# Patient Record
Sex: Female | Born: 1960 | Race: Black or African American | Hispanic: No | Smoking: Former smoker
Health system: Southern US, Community
[De-identification: ages and names within clinical notes are randomized; demographics above are authoritative.]

## PROBLEM LIST (undated history)

## (undated) DIAGNOSIS — E7800 Pure hypercholesterolemia, unspecified: Secondary | ICD-10-CM

## (undated) DIAGNOSIS — E669 Obesity, unspecified: Secondary | ICD-10-CM

## (undated) DIAGNOSIS — L03116 Cellulitis of left lower limb: Secondary | ICD-10-CM

## (undated) DIAGNOSIS — M25561 Pain in right knee: Secondary | ICD-10-CM

## (undated) DIAGNOSIS — K5900 Constipation, unspecified: Secondary | ICD-10-CM

## (undated) DIAGNOSIS — S069X1A Unspecified intracranial injury with loss of consciousness of 30 minutes or less, initial encounter: Secondary | ICD-10-CM

## (undated) DIAGNOSIS — S069X9A Unspecified intracranial injury with loss of consciousness of unspecified duration, initial encounter: Secondary | ICD-10-CM

## (undated) DIAGNOSIS — M1990 Unspecified osteoarthritis, unspecified site: Secondary | ICD-10-CM

## (undated) DIAGNOSIS — R0602 Shortness of breath: Secondary | ICD-10-CM

## (undated) DIAGNOSIS — I10 Essential (primary) hypertension: Secondary | ICD-10-CM

## (undated) DIAGNOSIS — M14679 Charcot's joint, unspecified ankle and foot: Secondary | ICD-10-CM

## (undated) HISTORY — PX: JOINT REPLACEMENT: SHX530

## (undated) HISTORY — PX: COLONOSCOPY: SHX174

## (undated) HISTORY — PX: OTHER SURGICAL HISTORY: SHX169

## (undated) HISTORY — PX: QUADRICEPS REPAIR: SHX2281

## (undated) HISTORY — PX: KNEE SURGERY: SHX244

---

## 2005-12-18 ENCOUNTER — Emergency Department (HOSPITAL_COMMUNITY): Admission: EM | Admit: 2005-12-18 | Discharge: 2005-12-18 | Payer: Self-pay | Admitting: Emergency Medicine

## 2006-06-08 ENCOUNTER — Ambulatory Visit: Payer: Self-pay | Admitting: Endocrinology

## 2006-06-16 ENCOUNTER — Ambulatory Visit: Payer: Self-pay | Admitting: Endocrinology

## 2006-07-19 ENCOUNTER — Ambulatory Visit: Payer: Self-pay | Admitting: Endocrinology

## 2007-07-06 ENCOUNTER — Inpatient Hospital Stay (HOSPITAL_COMMUNITY): Admission: EM | Admit: 2007-07-06 | Discharge: 2007-07-11 | Payer: Self-pay | Admitting: *Deleted

## 2007-07-08 ENCOUNTER — Ambulatory Visit: Payer: Self-pay | Admitting: Internal Medicine

## 2007-07-17 ENCOUNTER — Encounter: Admission: RE | Admit: 2007-07-17 | Discharge: 2007-08-28 | Payer: Self-pay | Admitting: Internal Medicine

## 2007-07-18 ENCOUNTER — Ambulatory Visit: Payer: Self-pay | Admitting: Endocrinology

## 2007-07-29 ENCOUNTER — Encounter: Payer: Self-pay | Admitting: Endocrinology

## 2007-07-29 DIAGNOSIS — I10 Essential (primary) hypertension: Secondary | ICD-10-CM | POA: Insufficient documentation

## 2007-07-29 DIAGNOSIS — F329 Major depressive disorder, single episode, unspecified: Secondary | ICD-10-CM | POA: Insufficient documentation

## 2007-07-29 DIAGNOSIS — E119 Type 2 diabetes mellitus without complications: Secondary | ICD-10-CM

## 2007-08-01 ENCOUNTER — Ambulatory Visit: Payer: Self-pay | Admitting: Endocrinology

## 2007-08-06 ENCOUNTER — Emergency Department (HOSPITAL_COMMUNITY): Admission: EM | Admit: 2007-08-06 | Discharge: 2007-08-06 | Payer: Self-pay | Admitting: Emergency Medicine

## 2007-08-30 ENCOUNTER — Ambulatory Visit: Payer: Self-pay | Admitting: Endocrinology

## 2007-10-17 ENCOUNTER — Telehealth: Payer: Self-pay | Admitting: Endocrinology

## 2007-11-01 ENCOUNTER — Ambulatory Visit: Payer: Self-pay | Admitting: Endocrinology

## 2007-11-01 DIAGNOSIS — R079 Chest pain, unspecified: Secondary | ICD-10-CM | POA: Insufficient documentation

## 2007-11-08 ENCOUNTER — Encounter: Payer: Self-pay | Admitting: Endocrinology

## 2007-11-08 ENCOUNTER — Ambulatory Visit: Payer: Self-pay

## 2007-11-13 ENCOUNTER — Emergency Department (HOSPITAL_COMMUNITY): Admission: EM | Admit: 2007-11-13 | Discharge: 2007-11-13 | Payer: Self-pay | Admitting: Emergency Medicine

## 2007-11-15 ENCOUNTER — Encounter (INDEPENDENT_AMBULATORY_CARE_PROVIDER_SITE_OTHER): Payer: Self-pay | Admitting: *Deleted

## 2007-11-16 ENCOUNTER — Emergency Department (HOSPITAL_COMMUNITY): Admission: EM | Admit: 2007-11-16 | Discharge: 2007-11-16 | Payer: Self-pay | Admitting: Emergency Medicine

## 2007-11-17 ENCOUNTER — Ambulatory Visit: Payer: Self-pay | Admitting: Internal Medicine

## 2007-11-17 DIAGNOSIS — E1169 Type 2 diabetes mellitus with other specified complication: Secondary | ICD-10-CM

## 2007-11-17 LAB — CONVERTED CEMR LAB
Creatinine, Ser: 0.9 mg/dL (ref 0.4–1.2)
GFR calc Af Amer: 87 mL/min
HDL: 29 mg/dL — ABNORMAL LOW (ref >39.0)
Hgb A1c MFr Bld: 8 % — ABNORMAL HIGH (ref 4.6–6.0)
Potassium: 4.3 meq/L (ref 3.5–5.1)
Sodium: 137 meq/L (ref 135–145)
Total CHOL/HDL Ratio: 3.9
Triglycerides: 57 mg/dL (ref 0–149)

## 2007-11-19 DIAGNOSIS — F411 Generalized anxiety disorder: Secondary | ICD-10-CM | POA: Insufficient documentation

## 2007-11-27 ENCOUNTER — Encounter (HOSPITAL_BASED_OUTPATIENT_CLINIC_OR_DEPARTMENT_OTHER): Admission: RE | Admit: 2007-11-27 | Discharge: 2007-12-17 | Payer: Self-pay | Admitting: Internal Medicine

## 2007-11-28 ENCOUNTER — Ambulatory Visit (HOSPITAL_COMMUNITY): Admission: RE | Admit: 2007-11-28 | Discharge: 2007-11-28 | Payer: Self-pay | Admitting: Surgery

## 2007-12-13 ENCOUNTER — Ambulatory Visit (HOSPITAL_COMMUNITY): Admission: RE | Admit: 2007-12-13 | Discharge: 2007-12-13 | Payer: Self-pay | Admitting: Surgery

## 2007-12-15 ENCOUNTER — Encounter: Payer: Self-pay | Admitting: Internal Medicine

## 2007-12-18 ENCOUNTER — Encounter (HOSPITAL_BASED_OUTPATIENT_CLINIC_OR_DEPARTMENT_OTHER): Admission: RE | Admit: 2007-12-18 | Discharge: 2008-01-05 | Payer: Self-pay | Admitting: Surgery

## 2008-01-04 ENCOUNTER — Encounter: Payer: Self-pay | Admitting: Internal Medicine

## 2008-01-08 ENCOUNTER — Encounter: Payer: Self-pay | Admitting: Internal Medicine

## 2008-01-08 ENCOUNTER — Ambulatory Visit (HOSPITAL_COMMUNITY): Admission: RE | Admit: 2008-01-08 | Discharge: 2008-01-08 | Payer: Self-pay | Admitting: Surgery

## 2008-01-08 ENCOUNTER — Encounter (HOSPITAL_BASED_OUTPATIENT_CLINIC_OR_DEPARTMENT_OTHER): Admission: RE | Admit: 2008-01-08 | Discharge: 2008-02-22 | Payer: Self-pay | Admitting: Surgery

## 2008-01-10 ENCOUNTER — Ambulatory Visit: Payer: Self-pay | Admitting: Internal Medicine

## 2008-01-10 ENCOUNTER — Telehealth (INDEPENDENT_AMBULATORY_CARE_PROVIDER_SITE_OTHER): Payer: Self-pay | Admitting: *Deleted

## 2008-01-10 DIAGNOSIS — M79609 Pain in unspecified limb: Secondary | ICD-10-CM | POA: Insufficient documentation

## 2008-01-16 ENCOUNTER — Ambulatory Visit: Payer: Self-pay

## 2008-01-16 ENCOUNTER — Encounter: Payer: Self-pay | Admitting: Internal Medicine

## 2008-02-16 ENCOUNTER — Encounter: Payer: Self-pay | Admitting: Endocrinology

## 2008-05-09 ENCOUNTER — Emergency Department (HOSPITAL_COMMUNITY): Admission: EM | Admit: 2008-05-09 | Discharge: 2008-05-09 | Payer: Self-pay | Admitting: Emergency Medicine

## 2008-05-15 ENCOUNTER — Emergency Department (HOSPITAL_COMMUNITY): Admission: EM | Admit: 2008-05-15 | Discharge: 2008-05-15 | Payer: Self-pay | Admitting: Emergency Medicine

## 2008-08-22 ENCOUNTER — Emergency Department (HOSPITAL_COMMUNITY): Admission: EM | Admit: 2008-08-22 | Discharge: 2008-08-23 | Payer: Self-pay | Admitting: Emergency Medicine

## 2009-02-09 ENCOUNTER — Emergency Department (HOSPITAL_COMMUNITY): Admission: EM | Admit: 2009-02-09 | Discharge: 2009-02-09 | Payer: Self-pay | Admitting: Emergency Medicine

## 2009-12-01 ENCOUNTER — Ambulatory Visit: Payer: Self-pay | Admitting: Diagnostic Radiology

## 2009-12-01 ENCOUNTER — Ambulatory Visit (HOSPITAL_BASED_OUTPATIENT_CLINIC_OR_DEPARTMENT_OTHER): Admission: RE | Admit: 2009-12-01 | Discharge: 2009-12-01 | Payer: Self-pay | Admitting: Internal Medicine

## 2010-02-03 ENCOUNTER — Emergency Department (HOSPITAL_COMMUNITY): Admission: EM | Admit: 2010-02-03 | Discharge: 2010-02-03 | Payer: Self-pay | Admitting: Family Medicine

## 2010-07-23 ENCOUNTER — Encounter: Admission: RE | Admit: 2010-07-23 | Discharge: 2010-09-11 | Payer: Self-pay | Admitting: Internal Medicine

## 2011-01-07 ENCOUNTER — Other Ambulatory Visit: Payer: Self-pay | Admitting: Internal Medicine

## 2011-01-07 DIAGNOSIS — Z1231 Encounter for screening mammogram for malignant neoplasm of breast: Secondary | ICD-10-CM

## 2011-01-07 DIAGNOSIS — Z1239 Encounter for other screening for malignant neoplasm of breast: Secondary | ICD-10-CM

## 2011-01-22 ENCOUNTER — Ambulatory Visit
Admission: RE | Admit: 2011-01-22 | Discharge: 2011-01-22 | Disposition: A | Discharge status: Home / Self Care | Payer: Medicaid Other | Source: Ambulatory Visit | Attending: Internal Medicine | Admitting: Internal Medicine

## 2011-01-22 DIAGNOSIS — Z1231 Encounter for screening mammogram for malignant neoplasm of breast: Secondary | ICD-10-CM

## 2011-03-17 ENCOUNTER — Ambulatory Visit: Payer: Medicaid Other | Attending: Internal Medicine | Admitting: Rehabilitation

## 2011-03-17 DIAGNOSIS — M79609 Pain in unspecified limb: Secondary | ICD-10-CM | POA: Insufficient documentation

## 2011-03-17 DIAGNOSIS — IMO0001 Reserved for inherently not codable concepts without codable children: Secondary | ICD-10-CM | POA: Insufficient documentation

## 2011-03-17 DIAGNOSIS — M25676 Stiffness of unspecified foot, not elsewhere classified: Secondary | ICD-10-CM | POA: Insufficient documentation

## 2011-03-17 DIAGNOSIS — M25673 Stiffness of unspecified ankle, not elsewhere classified: Secondary | ICD-10-CM | POA: Insufficient documentation

## 2011-03-30 LAB — CBC
HCT: 35 % — ABNORMAL LOW (ref 36.0–46.0)
Platelets: 254 10*3/uL (ref 150–400)
RBC: 4.37 MIL/uL (ref 3.87–5.11)
RDW: 17.2 % — ABNORMAL HIGH (ref 11.5–15.5)
WBC: 6.6 10*3/uL (ref 4.0–10.5)

## 2011-03-30 LAB — URINALYSIS, ROUTINE W REFLEX MICROSCOPIC
Bilirubin Urine: NEGATIVE
Ketones, ur: NEGATIVE mg/dL
Leukocytes, UA: NEGATIVE
Nitrite: NEGATIVE
Urobilinogen, UA: 0.2 mg/dL (ref 0.0–1.0)

## 2011-03-30 LAB — DIFFERENTIAL
Eosinophils Absolute: 0 10*3/uL (ref 0.0–0.7)
Lymphocytes Relative: 7 % — ABNORMAL LOW (ref 12–46)
Monocytes Absolute: 0.4 10*3/uL (ref 0.1–1.0)
Neutro Abs: 5.8 10*3/uL (ref 1.7–7.7)
Neutrophils Relative %: 87 % — ABNORMAL HIGH (ref 43–77)

## 2011-03-30 LAB — COMPREHENSIVE METABOLIC PANEL
ALT: 24 U/L (ref 0–35)
AST: 24 U/L (ref 0–37)
Albumin: 2.9 g/dL — ABNORMAL LOW (ref 3.5–5.2)
Alkaline Phosphatase: 44 U/L (ref 39–117)
Creatinine, Ser: 0.79 mg/dL (ref 0.4–1.2)
GFR calc Af Amer: >60 mL/min (ref >60)
Potassium: 3.7 mEq/L (ref 3.5–5.1)
Total Protein: 6.2 g/dL (ref 6.0–8.3)

## 2011-03-30 LAB — URINE MICROSCOPIC-ADD ON

## 2011-03-30 LAB — GLUCOSE, CAPILLARY: Glucose-Capillary: 203 mg/dL — ABNORMAL HIGH (ref 70–99)

## 2011-03-31 ENCOUNTER — Ambulatory Visit: Payer: Medicaid Other | Admitting: Rehabilitation

## 2011-04-07 ENCOUNTER — Encounter: Payer: Medicaid Other | Admitting: Rehabilitation

## 2011-04-07 ENCOUNTER — Ambulatory Visit: Payer: Medicaid Other | Admitting: Rehabilitative and Restorative Service Providers"

## 2011-04-20 ENCOUNTER — Ambulatory Visit: Payer: Medicaid Other | Attending: Internal Medicine | Admitting: Rehabilitation

## 2011-04-20 DIAGNOSIS — M25673 Stiffness of unspecified ankle, not elsewhere classified: Secondary | ICD-10-CM | POA: Insufficient documentation

## 2011-04-20 DIAGNOSIS — M25676 Stiffness of unspecified foot, not elsewhere classified: Secondary | ICD-10-CM | POA: Insufficient documentation

## 2011-04-20 DIAGNOSIS — M79609 Pain in unspecified limb: Secondary | ICD-10-CM | POA: Insufficient documentation

## 2011-04-20 DIAGNOSIS — IMO0001 Reserved for inherently not codable concepts without codable children: Secondary | ICD-10-CM | POA: Insufficient documentation

## 2011-04-27 NOTE — Assessment & Plan Note (Signed)
[facility]   NAME:  DANAIJA, ESKRIDGE           ACCOUNT NO.:  [id number]   MEDICAL RECORD NO.:  [id number]      DATE OF BIRTH:  January 16, 1961   PHYSICIAN:  Theresia Majors. Tanda Rockers, M.D. VISIT DATE:  02/01/2008                                   OFFICE VISIT   SUBJECTIVE:  Ms. Minus returns for follow-up of a [address] 4 diabetic  foot ulcer involving the second toe of the left foot.  The patient has  completed a course of hyperbaric oxygen treatment with resolution of the  wound.  She returns for follow-up.  There has been no interim drainage  malodor pain or fever.  She continues to be ambulatory with a protective  offloading healing sandal.   OBJECTIVE:  Blood pressure is 147/87, respirations 18, pulse rate 74,  temperature 98.4, capillary blood glucoses 98 mg percent.  Inspection of the left second toe shows that there is absolutely no  drainage and no ulceration.  The toe is bulbous.  The pedal pulse  remains palpable.  There is good capillary refill.  There is associated  2+ edema with chronic changes of stasis bilaterally.   IMPRESSION:  Resolved Wagner grade 3 diabetic foot ulcer.   PLAN:  We are discharging the patient from active management in the  Wound Center.  We have recommended that she procure and wear bilateral  below-the-knee open toe 30-40 mmHg compression hose.  We have also given  her a prescription for an orthotics consultation for the fashioning of  custom inserts with extra-depth shoes.  We have given the patient  opportunity to ask questions.  She seems to understand these  instructions.  We have encouraged her to keep her follow-up appointments  with her internist Dr. Oliver Barre.  She indicates that she understands  these instructions and will be compliant expresses gratitude for having  been seen in the clinic.  The patient is discharged.      Harold A. Tanda Rockers, M.D.  Electronically Signed     HAN/MEDQ  D:  02/01/2008  T:  02/01/2008   Job:  [id number]   cc:   Corwin Levins, MD

## 2011-04-27 NOTE — Assessment & Plan Note (Signed)
[facility]   NAME:  Kaitlyn Castillo, Kaitlyn Castillo           ACCOUNT NO.:  [id number]   MEDICAL RECORD NO.:  [id number]      DATE OF BIRTH:  [birth year]   PHYSICIAN:  Theresia Majors. Tanda Rockers, M.D. VISIT DATE:  12/13/2007                                   OFFICE VISIT   SUBJECTIVE:  Ms. Kutzer is a 50 year old female who is undergoing  hyperbaric oxygen treatment for a Wagner grade III diabetic foot ulcer  involving the left foot.  In the interim she has complained of pressure  symptoms in her right ear.  Her external auditory canal is partially  occluded by cerumen.  She has been referred to ENT for evaluation.  She  continues to be ambulatory, utilizing an offloading healing sandal.  There has been no fever.  There has been no drainage.  The patient is  not on antibiotics.   OBJECTIVE:  VITAL SIGNS:  Blood pressure is 128/67, respirations are 18,  pulse rate 72, temperature is 97.8.  Capillary blood glucose is 99.  HEENT:  Exam is clear.  There is some hyperemia of the nasal mucosa.  The right external auditory canal is partially occluded with ear wax.  The left TM is clear.  RESPIRATORY:  The lungs are clear.  CARDIOVASCULAR:  The heart sounds are distant.  MUSCULOSKELETAL:  Examination of the lower extremity shows persistence  of 2+ edema in the left lower extremity.  The second toe has a thick  callus with near sealing of a deeper cavity.   A debridement utilizing a curette was performed with excision of  nonviable necrotic tissue from the subcutaneous level as well as  nonviable necrotic skin.  There was chronic reaction and periosteum from  the distal phalanx.  An aliquot of this debrided material was sent for  pathologic examination, and a culture was taken from the deep wound.  The pedal pulse remains readily palpable.   ASSESSMENT:  A Wagner grade III diabetic foot ulcer.   RECOMMENDATION:  Proceed and complete the ENT evaluation.  Continue  hyperbaric oxygen  treatment.      Harold A. Tanda Rockers, M.D.  Electronically Signed     HAN/MEDQ  D:  12/13/2007  T:  12/13/2007  Job:  [id number]

## 2011-04-27 NOTE — Assessment & Plan Note (Signed)
[facility]   NAME:  Kaitlyn Castillo, Kaitlyn Castillo           ACCOUNT NO.:  [id number]   MEDICAL RECORD NO.:  [id number]      DATE OF BIRTH:  11-Jul-1961   PHYSICIAN:  Theresia Majors. Tanda Rockers, M.D. VISIT DATE:  01/04/2008                                   OFFICE VISIT   SUBJECTIVE:  Ms. Bun is a 50 year old lady who we are following for  a Wagoner III diabetic foot ulcer involving the left second toe.  The  patient is currently undergoing hyperbaric oxygen treatment and has  completed 15 of 30 dives.  There has been no excessive drainage, malodor  or fever.  She continues to have daily irrigations and a ribbon packing  utilizing a quarter-inch plain Nu-Gauze.  There have been no pressure  symptoms in the ears, no pulmonary symptomatology, no significant  claustrophobia or oxygen toxicity symptomatology.   OBJECTIVE:  Her blood pressure is 164/96, pulse rate 64, temperature  98.9.  Capillary blood glucose of 236 mg% following HBO.  HEENT:  Clear.  The tympanic membranes are normal.  LUNGS:  Clear.  Inspection of the wound shows that there is local edema involving the  entire second toe.  A Q-tip was used to sound the depth of the wound.  This is clean with a serous drainage.  There is no extension of  tenderness or pain into the metatarsal head nor into the dorsum of the  foot.  Both feet are symmetrically warm but they are not feverish.  Capillary refill is brisk.  There is no evidence of critical ischemia.  The patient remains mildly insensate.  The pedal pulse is faintly  palpable bilaterally.  The wound was cultured and forwarded.   ASSESSMENT:  Clinical improvement of Wagoner III diabetic foot ulcer.   PLAN:  We will continue the hyperbaric oxygen.  We have started the  patient on Septra DS one p.o. b.i.d.  We will continue her HBO and  reevaluate her after an additional five dives.   We have explained the potential for more proximal extension of her  suppurative  process.  We are  still not sure that our HBO treatment will  result in salvage of the digit.  We have given the patient the  opportunity to ask questions.  She seems to understand and wishes to  continue with the treatment as outlined.      Harold A. Tanda Rockers, M.D.  Electronically Signed     HAN/MEDQ  D:  01/04/2008  T:  01/04/2008  Job:  [id number]

## 2011-04-27 NOTE — Consult Note (Signed)
NAME:  Kaitlyn Castillo, GRANATO           ACCOUNT NO.:  [id number]   MEDICAL RECORD NO.:  [id number]          PATIENT TYPE:  REC   LOCATION:  FOOT                         FACILITY:  [facility]   PHYSICIAN:  Harold A. Tanda Rockers, M.D.DATE OF BIRTH:  09-Jan-1961   DATE OF CONSULTATION:  11/28/2007  DATE OF DISCHARGE:                                 CONSULTATION   SUBJECTIVE:  Ms. Kaitlyn Castillo is a 50 year old female referred by Dr. Oliver Barre for evaluation of an ulceration of the third toe of the left foot.   IMPRESSION:  Wagoner grade 3 diabetic foot ulcer.   RECOMMENDATIONS:  Proceed with hyperbaric oxygen treatment with  continuation of offloading and serial debridements.  We will resume  antibiotic coverage based on the return of her culture results.   SUBJECTIVE:  Ms. Kaitlyn Castillo is a 50 year old diabetic who noted blisters on  her lower extremities in June 2008.  She subsequently developed a frank  ulceration on the distal phalanx of the second toe of the left foot. The  patient was admitted to the hospital, underwent intravenous antibiotic  therapy and bedside debridement by Dr. Lajoyce Corners.  She was subsequently  discharged and continued under the care of Dr. Lajoyce Corners with serial  debridements over the ensuing months.  She has had an independent  evaluations by podiatry.  Her blood glucoses, according to the patient,  have been reasonable.  She was ultimately seen in the emergency room with redness and drainage.  Prior to her evaluation by Dr. Jonny Ruiz, the patient had been advised to  consider an amputation of the third toe.  She apparently chose to ignore  this recommendation and was under no treatment at all until she was seen  in the emergency room and referred to the wound center.   Three weeks ago she was seen in the emergency room with drainage, was  subsequently seen by Dr. Oliver Barre, who was covering for Dr. Romero Belling.  The patient was started on Levaquin and referred to the wound  center for  evaluation.   PAST MEDICAL HISTORY:  Remarkable for diabetes for a decade.   CURRENT MEDICATIONS:  1. A-recently completed course of Levaquin.  2. Lantus 100 units q.a.m.  3. Lisinopril/hydrochlorothiazide 20/12.5 mg.  4. She takes one aspirin 81 mg daily.   She denies allergies.   FAMILY HISTORY:  Positive for diabetes, hypertension, stroke and heart  attack.   Socially, she is separated.  She has four children.  She has moved to  this area from [place].  She is unemployed and cares for an autistic  child.  She is receiving social services assistance.   REVIEW OF SYSTEMS:  Remarkable for recent chest pain, for which she is  undergoing currently a workup.  She has had a negative stress test thus  far and is scheduled for an echocardiogram.  She has had no acute  changes on an EKG.  She is a nonsmoker.  She denies dyspnea on exertion.  She is able to walk four blocks and negotiate a flight of stairs.  Her  bowel and bladder function are described as normal.  She denies visual  changes, transient paralysis or other stigmata of TIAs.  The remainder  of the review of systems is negative.   PHYSICAL EXAM:  She is an alert female in no acute distress.  Blood pressure is 152/88, respirations 16, pulse rate 74, temperature is  98.2, capillary blood glucose is 194 mg%.  HEENT:  Clear.  NECK:  Supple.  Trachea is midline.  Thyroid is nonpalpable.  LUNGS:  Clear.  Heart sounds are distant.  ABDOMEN:  Soft.  EXTREMITIES:  Remarkable for bilateral 3+ edema.  Over the second digit  of the left foot is a penetrating ulcer that extends down to the  cancellous bone of the distal phalanx.  There is a halo of nonviable,  thickened, necrotic callus.  An excisional debridement was performed  with a 10 blade with hemorrhage controlled with direct pressure.  A  culture was taken from the depths of the wound.  NEUROLOGIC:  The patient is insensate.   DISCUSSION:  This patient has a Wagoner grade  3 diabetic foot ulcer with  that has been under treatment for at least 6 months and has had  deterioration.  She has been given adequate offloading but on today's  exam she is wearing a flat-bottom canvas shoe which is totally  inadequate.  The patient has been counseled regarding the critical  nature of this ulcer extending down into the bone.  We have recommended  that we proceed with hyperbaric oxygen treatment as a last resort in an  attempt to avoid an amputation.  We have explained the indications for  hyperbaric oxygen, the potential benefits, potential complications and  the risk specifically of barotrauma, claustrophobia and oxygen toxicity.  We have given the patient an opportunity to ask questions.  She seems to  comprehend the assessment and the recommendation and is anxious to  proceed.  We have initiated a retrieval of her CBC, PA and lateral chest  film and electrocardiogram.  We will schedule her pending review of  these studies.   With regard to her wound, we have encouraged her to continue daily  antiseptic soap washing, thoroughly drying, and we have provided her  with an offloading healing sandal to avoid trauma to and to protect her  insensate extremity.  We will reevaluate her in 1 week.      Harold A. Tanda Rockers, M.D.  Electronically Signed     HAN/MEDQ  D:  11/28/2007  T:  11/28/2007  Job:  [id number]   cc:   Gregary Signs A. Everardo All, MD  Corwin Levins, MD  Nadara Mustard, MD

## 2011-04-27 NOTE — Assessment & Plan Note (Signed)
[facility]   NAME:  Kaitlyn Castillo, Kaitlyn Castillo           ACCOUNT NO.:  [id number]   MEDICAL RECORD NO.:  [id number]      DATE OF BIRTH:  1961/09/12   PHYSICIAN:  Jonelle Sports. Sevier, M.D.  VISIT DATE:  12/05/2007                                   OFFICE VISIT   HISTORY:  This 50 year old black female is seen for her second visit.  She has a Wagner grade 4 ulceration of the left second toe complicated  by osteomyelitis.  She had recommendations for toe amputation but  efforts will be made to salvage that toe here through use of hyperbaric  oxygen therapy.   Since her last visit here a week ago.  She has received approval from  her insurance company and is in the process of completing all of her  medical tests.  Essentially the only thing not yet completed being her  echocardiogram.   She reports the wound itself has been mildly painful but with no  increase in that.  There has been no odor.  She thinks her discharge has  decreased somewhat and she has had no fever or systemic symptoms.  The  toe is quite swollen and remains approximately same that regard.   EXAMINATION:  Blood pressure 120/68 pulse 70 and regular, respirations  18, temperature 98.4.  The wound on the tip of the left second toe plantar aspect measures  0.3x0.5x0.1 cm in depth but with an area extending from the dorsal  margin of this wound toward the nailbed that is soft and slightly  fluctuant.   The toe itself is chronically swollen and there is some tenderness to  palpation particularly over the lateral aspect of that DIP joint.   There is no drainage or odor today of any consequence and the wound  looks reasonably good.   IMPRESSION:  Wagner stage 4 diabetic foot ulcer, left second toe.   DISPOSITION:  1. The wound is debrided partial thickness removing the skin over the      suspicious area dissecting from the wound toward the nailbed.      There is no frank purulence here but some  semi-necrotic tissue      which is removed.  The wound is also a cleaned of considerable      thickening and blistering of the skin around the primary      ulceration.  2. The wound is then treated with application of a dry dressing and      she is placed in a Darco platform walker which she is to continue      until her visit here 6 days hence to begin her hyperbaric oxygen      therapy.  Wound evaluation will be done 3 days into such therapy.           ______________________________  Jonelle Sports. Cheryll Cockayne, M.D.     RES/MEDQ  D:  12/05/2007  T:  12/05/2007  Job:  [id number]

## 2011-04-27 NOTE — Assessment & Plan Note (Signed)
[facility]   NAME:  Kaitlyn Castillo, Kaitlyn Castillo           ACCOUNT NO.:  [id number]   MEDICAL RECORD NO.:  [id number]      DATE OF BIRTH:  23-Jun-1961   PHYSICIAN:  Theresia Majors. Tanda Rockers, M.D. VISIT DATE:  12/13/2007                                   OFFICE VISIT   SUBJECTIVE:  Kaitlyn Castillo is a 50 year old diabetic whom we are following  for Wagner grade 3 diabetic foot ulcer.  We have placed her hyperbaric  oxygen treatment on hold because of complaints of right ear pressure.  She has a pending evaluation from the otolaryngologist.  In the interim  we have proceeded with her wound evaluation.  There has been no  significant drainage or fever.  She continues to be ambulatory and is  wearing an offloading healing sandal.   OBJECTIVE:  Blood pressure is 128/67, respirations are 18, pulse rate  72, temperature 97.8.  The capillary blood glucoses 99 mg percent.  Inspection of the left foot and second toe shows a scared-over calloused  ulcer with an underlying fluctuance.  Upon sounding with a Q-tip this  wound extends down to the distal phalanx.  A curette was used to debride  this wound of nonviable necrotic tissue, periosteum of the distal  phalanx, and subcutaneous tissue.  An aliquot of this was forwarded to  pathology.      Harold A. Tanda Rockers, M.D.     Kaitlyn Castillo  D:  12/13/2007  T:  12/13/2007  Job:  [id number]

## 2011-04-27 NOTE — Assessment & Plan Note (Signed)
[facility]   NAME:  Kaitlyn Castillo, Kaitlyn Castillo           ACCOUNT NO.:  [id number]   MEDICAL RECORD NO.:  [id number]      DATE OF BIRTH:  Mar 08, 1961   PHYSICIAN:  Theresia Majors. Tanda Rockers, M.D. VISIT DATE:  01/11/2008                                   OFFICE VISIT   SUBJECTIVE:  Ms. Mcgibbon is a 50 year old undergoing hyperbaric oxygen  treatment for Wagner 3 diabetic foot ulcer.  In the interim she has had  some persistent drainage which has not been associated with fever.  She  has also had an episode of swelling in her left lower extremity which  has responded to compression and has been attributed to stasis rather  than an acute infection.  Her wound was cultured but the results are  outstanding at this time.  There is been no interim fever.   OBJECTIVE:  Blood pressure is 164/90, respirations 18, pulse rate 71,  temperature is 98.3.  Capillary blood glucoses 161 mg percent following  her HBO treatment.  Inspection of the second toe shows that there has been significant  decrease in the edema.  There is no local warmth.  There is no malodor.  There is no particular drainage.  The Q-tip was used to sound the wound.  The depth is approximately 1.4 cm.  There is no malodor.  There is no  evidence of ascending infection.  The foot is warm but is not feverish.  The pedal pulse remains palpable.   Review of the x-ray shows that there is complete erosion of the distal  portion of the distal phalanx.  There are additional findings of  osteopenia in the foot bones.   ASSESSMENT:  Clinical improvement Wagner grade 3 diabetic foot ulcer  with hyperbaric oxygen.  />  We will continue the patient on Septra pending the final culture  results.  Will continue her HBO.  We have explained that the patient may  ultimately require an amputation of the distal phalanx in order to  remove all nonviable and chronically inflamed tissue.  She is not  enthusiastic about this prospect.  We have  agreed to continue her  hyperbaric as well as her antibiotics.  She will continue follow-up with  Dr. Melvyn Novas for management of her diabetes.  A duplex scan has been  ordered by Dr. Melvyn Novas.  We will reevaluate the wound in 5 days.  We will  continue hyperbarics.      Harold A. Tanda Rockers, M.D.  Electronically Signed     HAN/MEDQ  D:  01/11/2008  T:  01/11/2008  Job:  [id number]

## 2011-04-27 NOTE — Assessment & Plan Note (Signed)
[facility]   NAME:  Kaitlyn Castillo, Kaitlyn Castillo           ACCOUNT NO.:  [id number]   MEDICAL RECORD NO.:  [id number]      DATE OF BIRTH:  Jan 20, 1961   PHYSICIAN:  Theresia Majors. Tanda Rockers, M.D. VISIT DATE:  01/25/2008                                   OFFICE VISIT   SUBJECTIVE:  Kaitlyn Castillo is a 50 year old lady with a Wegener III  diabetic foot ulcer who is undergoing hyperbaric oxygen treatment.  She  has completed 29 of 30 ordered dives today.  She returns for follow-up.  There have been no symptoms of oxygen toxicity, barotrauma or  claustrophobia.  She continues to be ambulatory.   OBJECTIVE:  Her blood pressure is 172/94, pulse rate of 77, respirations  18, temperature is 98.3, capillary blood glucoses 211 mg percent  following her HBO treatment.  Inspection of the wound shows that the  edema is reasonably controlled although there is a persistence of one to  two plus edema with chronic stasis changes.  Inspection of the left  distal phalanx shows that the indentation cavity is still present, but  there has been re-epithelialization throughout.  Attempts at sounding  with a Q-tip does not admit penetration.  There is no drainage.  There  is a bulbous deformity of the toe, but there is no fluctuance.  Inspection of the volar aspect of the foot at the metatarsophalangeal  joint is normal.  There is no evidence of abscess or tracking into the  midfoot   ASSESSMENT:  Clinical improvement with near resolution of the Wegener  III diabetic foot ulcer.   PLAN:  We will complete dive #30 and discontinue her hyperbaric oxygen  treatment.  We will continue her in offloading orthotics.  We will  reevaluate her in one week p.r.n.      Harold A. Tanda Rockers, M.D.  Electronically Signed     HAN/MEDQ  D:  01/25/2008  T:  01/26/2008  Job:  [id number]

## 2011-04-27 NOTE — Assessment & Plan Note (Signed)
[facility]   NAME:  ATAYA, MURDY           ACCOUNT NO.:  [id number]   MEDICAL RECORD NO.:  [id number]      DATE OF BIRTH:  1961-07-25   PHYSICIAN:  Theresia Majors. Tanda Rockers, M.D. VISIT DATE:  12/28/2007                                   OFFICE VISIT   SUBJECTIVE:  Ms. Kaitlyn Castillo is a 50 year old lady who is undergoing  hyperbaric oxygen treatment for a Wagner grade-3 ulcer involving her  left second toe.  In the interim, she has continued to wear an  offloading healing sandal.  She is not on antibiotics.  There has been  no malodorous drainage. There are no symptoms of barotrauma, O2 toxicity  or claustrophobia.   OBJECTIVE:  Blood pressure is 153/88, respirations of 18, pulse rate 75,  temperature 98.6.  Capillary blood glucose is 213 mg percent.  Inspection of the left second toe shows that there is healthy-appearing  granulation in the sinus which was effectively debrided during her last  visit.  There is no evidence of active infection.  The foot is warm.  The pedal pulse is palpable.  The culture report shows no growth.  HEENT-  clear.  Neck-supple.  Lungs clear.  Heart-RRR.   ASSESSMENT:  Clinical response to hyperbaric oxygen treatment.   PLAN:  We will continue HBO with daily irrigations with saline and a  Plain Nu-Gauze ribbon pack into the open wound.  We will reevaluate the  wound in 5 days.      Harold A. Tanda Rockers, M.D.  Electronically Signed     HAN/MEDQ  D:  12/28/2007  T:  12/28/2007  Job:  [id number]

## 2011-04-27 NOTE — H&P (Signed)
NAME:  Kaitlyn Castillo, Kaitlyn Castillo           ACCOUNT NO.:  [id number]   MEDICAL RECORD NO.:  [id number]          PATIENT TYPE:  INP   LOCATION:  5733                         FACILITY:  [facility]   PHYSICIAN:  Kela Millin, M.D.DATE OF BIRTH:  1961/12/08   DATE OF ADMISSION:  07/05/2007  DATE OF DISCHARGE:                              HISTORY & PHYSICAL   PRIMARY CARE PHYSICIAN:  [facility] (patient does not remember  her physician's name).   CHIEF COMPLAINT:  Left second toe pain and swelling of foot.   HISTORY OF PRESENT ILLNESS:  The patient is a 50 year old, obese, black  female with past medical history significant for diabetes mellitus,  hypertension, and hyperlipidemia who presents with above complaints.  She states that one week ago she noticed a blister under the plantar  surface of the top of her left second toe and she also noticed some  swelling of that left foot.  Subsequently she reports that the blister  burst and then she began having a lot of pain in that left foot and the  swelling has worsened.  She also states that she has had some redness of  that foot.  She denies fevers, cough, chest pain, shortness of breath,  melena, diarrhea, and also hematochezia.  The patient was seen in the  emergency room and her blood glucose was noted to be elevated at 249.  The patient states that she was scheduled to see a podiatrist in two  days, but because she was having so much pain and the swelling  worsening, she knew she could not make it and so came to the ER.  She is  admitted for pain management and further evaluation.   PAST MEDICAL HISTORY:  As stated above.   MEDICATIONS:  The patient does not know the names of her medications  other than that she is on a diabetes and blood pressure medicine.   ALLERGIES:  No known drug allergies.   SOCIAL HISTORY:  She quit tobacco greater than 20 years ago.  She denies  alcohol.   FAMILY HISTORY:  Her father and mother had  hypertension, diabetes, and  MI.  They are both deceased.   REVIEW OF SYSTEMS:  See HPI.  Other review of systems negative.   PHYSICAL EXAMINATION:  GENERAL:  In general the patient is an obese,  middle-aged black female, in no respiratory distress.  VITAL SIGNS:  Temperature is 97.4, her blood pressure is 161/88,  initially 192/103, pulse is 63 and the O2 saturation is 97%.  HEENT:  PERRL, EOMI.  Sclerae anicteric.  Moist mucous membranes.  LUNGS:  Clear to auscultation bilaterally.  No crackles or wheezes.  CARDIOVASCULAR:  Regular rate and rhythm.  Normal S1/S2.  ABDOMEN:  Soft, bowel sounds present, nontender, nondistended.  No  organomegaly and no masses palpable.  EXTREMITIES:  The dorsum of her left foot is edematous and her second  toe is edematous as well, hyperpigmented, and on the plantar surface,  she has a dime-sized ulcer with a clean base at the top of her second  toe (plantar surface).  Warm, +1 pulses.  The right lower extremity is  within normal limits.   LABORATORY DATA:  The white cell count is 5.8, hemoglobin 12.7,  hematocrit 39.5, platelet count is 318, neutrophil count 56%.  Sodium is  136 with a potassium of 3.9, chloride 103, BUN 15, glucose 249.  The pH  is 7.37, PCO2 46.4, bicarb 27.3.   ASSESSMENT/PLAN:  1. Left diabetic foot ulcer with infection/cellulitis - will obtain x-      ray of left foot, empiric antibiotics, wound care consult.  Will      consider surgery consultation as appropriate pending above.  2. Diabetes mellitus - monitor Accu-Cheks, sliding scale coverage, and      start the patient on Lantus and optimize blood glucose control.  3. Hypertension - uncontrolled, initially, but improved as above,      start lisinopril as the patient does not know her medications, and      follow up.  4. Hyperlipidemia - obtain a fasting lipid profile and follow up.      Kela Millin, M.D.  Electronically Signed     ACV/MEDQ  D:  07/06/2007   T:  07/07/2007  Job:  [id number]   cc:   [facility]

## 2011-04-27 NOTE — Discharge Summary (Signed)
Kaitlyn Castillo, Kaitlyn Castillo           ACCOUNT NO.:  [id number]   MEDICAL RECORD NO.:  [id number]          PATIENT TYPE:  INP   LOCATION:  5733                         FACILITY:  [facility]   PHYSICIAN:  Valerie A. Felicity Coyer, MDDATE OF BIRTH:  05-Dec-1961   DATE OF ADMISSION:  07/05/2007  DATE OF DISCHARGE:  07/11/2007                               DISCHARGE SUMMARY   DISCHARGE DIAGNOSES:  1. Diabetic ulcer, left foot.  2. Diabetes, type 2, uncontrolled.  3. Hypertension.   HISTORY OF PRESENT ILLNESS:  Kaitlyn Castillo is a 50 year old female who was  admitted on July 06, 2007 with chief complaint of left toe pain and  swelling of left foot.  She noted 1 week prior to this admission  blistering of the plantar surface of the top of her left 2nd toe and  also noted some swelling of that foot.  She was admitted for further  evaluation and treatment.   PAST MEDICAL HISTORY:  1. Diabetes type 2.  2. Hypertension.  3. Hyperlipidemia.   COURSE OF HOSPITALIZATION:  PROBLEM #1 - DIABETIC ULCER, LEFT FOOT:  The  patient was admitted and was seen in consultation by Dr. Lajoyce Corners during  this admission, who recommend a Darco shoe and continued wound therapy.  He also recommended followup in the office 1 week after discharge.  He  felt that the patient may need heel cord lengthening if stretching is  unsuccessful.  She was treated with IV Unasyn, which was changed to oral  antibiotics at time of discharge.   PROBLEM #2 - DIABETES TYPE 2, UNCONTROLLED:  The patient was noted to  have a hemoglobin A1c of 14.5 on admission, suggesting noncompliance  prior to this admission.  She was started on Lantus during this  admission and Lantus was titrated upward to 30 units, at which time  blood sugars were in the 170s.  She was discharged to home after insulin  self-administration teaching.   MEDICATIONS AT TIME OF DISCHARGE:  1. Lantus insulin 30-unit injection subcu daily in the evening.  2. Insulin syringes,  alcohol swabs and test strips prescription      provided.  3. Simvastatin 80 mg p.o. daily.  4. Augmentin 875 mg p.o. b.i.d. for 7 days.  5. Lisinopril/hydrochlorothiazide 20/12.5 p.o. b.i.d.   DISCHARGE INSTRUCTIONS:  The patient was instructed to test her blood  sugar at least twice daily and keep a log of blood sugars to bring to  her appointment with Dr. Everardo All.   PERTINENT LABORATORY DATA AT TIME OF DISCHARGE:  Hemoglobin A1c 14.5.  BUN 14, creatinine 0.88.  Sodium 138, potassium 3.9.   FOLLOWUP:  The patient was instructed follow up with Dr. Everardo All on  July 18, 2007 at 3:15 p.m. and to follow up with Dr. Lajoyce Corners in 1 week.      Sandford Craze, NP      Raenette Rover. Felicity Coyer, MD  Electronically Signed    MO/MEDQ  D:  08/16/2007  T:  08/17/2007  Job:  [id number]   cc:   Gregary Signs A. Everardo All, MD

## 2011-04-27 NOTE — Assessment & Plan Note (Signed)
[facility]   NAME:  Kaitlyn Castillo, Kaitlyn Castillo           ACCOUNT NO.:  [id number]   MEDICAL RECORD NO.:  [id number]      DATE OF BIRTH:  02/21/1961   PHYSICIAN:  Theresia Majors. Tanda Rockers, M.D. VISIT DATE:  12/21/2007                                   OFFICE VISIT   SUBJECTIVE:  Kaitlyn Castillo is a 50 year old lady who is currently  undergoing hyperbaric oxygen treatment for Wagner grade 3 diabetic foot  ulcer involving the 2nd toe of the left foot.  In the interim, she  denies drainage.  There has been no ear pain, no symptoms of  claustrophobia or oxygen toxicity.   OBJECTIVE:  VITAL SIGNS:  Blood pressure is 184/100 (following HBO pulse  rate is 75), respirations are 18, temperature is 98.7.  Capillary blood  glucose is 230 mg percent post HBO.  HEENT EXAM:  Clear.  The tympanic membranes are clear.  There is no  evidence of barotrauma.  Trachea is midline.  LUNGS:  Clear.  EXTREMITIES:  Inspection of the wound involving the left 2nd toe shows  that there is a penetrating sinus 1.5 cm extending down to the  periosteum.  A rongeur was used to excise necrotic tissue with chronic-  appearing granulation.  An ellipse was used to create a fish mouth  through which a more effective debridement including skin, subcutaneous  tissue, and bone was excised.  Hemorrhage was controlled with direct  pressure.  The wound was copiously irrigated with saline, and an  Iodoform gauze packing was placed.  Inspection of the patient's footwear  a appears to be inadequate.  We will therefore change her to a flat  healing sandal with a transverse felt strip placed at the metatarsal  heads to offload the distal 2nd toe.   ASSESSMENT:  Clinical response to hyperbaric oxygen treatment.   PLAN:  We will continue the hyperbaric oxygen as well as daily  irrigations and Iodoform gauze packing.  She will complete her Levaquin  as prescribed.  We will re-evaluate the patient in 1 week.      Harold A.  Tanda Rockers, M.D.  Electronically Signed     HAN/MEDQ  D:  12/21/2007  T:  12/21/2007  Job:  [id number]

## 2011-04-27 NOTE — Assessment & Plan Note (Signed)
[facility]   NAME:  Kaitlyn Castillo, Kaitlyn Castillo           ACCOUNT NO.:  [id number]   MEDICAL RECORD NO.:  [id number]      DATE OF BIRTH:  02-03-1961   PHYSICIAN:  Theresia Majors. Tanda Rockers, M.D. VISIT DATE:  01/08/2008                                   OFFICE VISIT   SUBJECTIVE:  Kaitlyn Castillo is a 50 year old diabetic who is undergoing  hyperbaric oxygen treatment for Wagner grade III wound.  Over the  weekend the patient has noted increased swelling in her left lower  extremity.  She has had no fever.  She continues to be ambulatory and  active.  Her sugars have been as high as 250s to 60s.  She has been not  compliant with dietary restrictions and she has been increasing her  caloric intake in preparation for her hyperbarics.  There has been no  pain in the extremity.  There has been no palpitations or syncope.  There has been no polydipsia or polyuria.   OBJECTIVE:  VITAL SIGNS:  Blood pressure is 130/72, respirations 18,  pulse rate 73, temperature is 99.1.  Capillary blood glucose is 278 mg  percent. Inspection of the left foot shows that the left second toe in  fact has decreased in edema and is less painful.  The wound was probed  with a Q-tip and there is no malodor.  There is associated one to two  plus edema with a readily palpable dorsalis pedis pulse on the  anterolateral aspect. Of the left lower extremity is a hyperemic streak  that extends 6 cm inferior to the patella.  This area is locally warm  but is not excessively tender.  There is no tenderness in the popliteal  fossa.  There is no tenderness extending above the knee. There is full  range of motion. The patient remains insensate to the signs Weinstein  filament.   REVIEW OF HER CULTURES:  Show no significant growth.   ASSESSMENT:  Wagner grade 3 diabetic foot ulcer with possible stasis or  early cellulitis with possible lymphangitis.   PLAN:  The wound was recultured.  We will probe and irrigate the  wound.  We placed an iodoform ribbon packing using a 1/4 inch gauze into the  with depth of the wound. We will continue the patient in a healing  sandal. In addition, we will apply a Kerlix and an Ace for compression.  We have instructed the patient to make an appointment with a primary  care physician for consideration and management of her diabetes.  She  will continue on the Septra DS one p.o. b.i.d.  We will reevaluate her  prior to her HBO treatment on tomorrow.  The patient realizes that if  she develops fever or more proximal migration of her redness or malodor,  she is to call the clinic or contact a primary care physician for  further advice.  We have given the patient opportunity to ask questions.  She seems to understand and indicates that she will be compliant      Jake Shark A. Tanda Rockers, M.D.  Electronically Signed     HAN/MEDQ  D:  01/08/2008  T:  01/08/2008  Job:  [id number]   cc:   Corwin Levins, MD

## 2011-04-27 NOTE — Assessment & Plan Note (Signed)
[facility]   NAME:  Kaitlyn Castillo, Kaitlyn Castillo           ACCOUNT NO.:  [id number]   MEDICAL RECORD NO.:  [id number]      DATE OF BIRTH:  Jan 22, 1961   PHYSICIAN:  Theresia Majors. Tanda Rockers, M.D.      VISIT DATE:                                   OFFICE VISIT   SUBJECTIVE:  Kaitlyn Castillo is a 50 year old with a Wagner III diabetic  foot ulcer involving the left second toe.  In the interim, we have  treated her with hyperbarics, daily irrigations, and packing utilizing a  quarter-inch ribbon new gauze.  There has been no excessive drainage or  malodor.  There is been no pain.   OBJECTIVE:  VITAL SIGNS:  Blood pressure is 156/96, respirations 18,  pulse rate 75, temperature is 98.4, capillary blood glucose is 115 mg  percent, following her HBO.  Inspection of the toe shows that there has  been significant decrease in edema.  There is trace to no erythema at  all.  There is no local warmth.  The wound was sounded with a Q-tip and  extends down to the pulp.  There is reactive bleeding.  There is no  malodor.  The pedal pulse is palpable.  The edema in the pre-tibial and  ankle area is 1 to 2+ at best.  There is no local warmth.   ASSESSMENT:  Clinical improvement.   PLAN:  We will continue to HBO.  The patient is now on dive #24.  We  will ask that she be re-approved for an additional 30 dives.  We will  continue the local wound care and re-evaluate her wound in 1 week.      Harold A. Tanda Rockers, M.D.  Electronically Signed     HAN/MEDQ  D:  01/18/2008  T:  01/19/2008  Job:  [id number]

## 2011-05-11 ENCOUNTER — Emergency Department (HOSPITAL_COMMUNITY): Payer: Medicaid Other

## 2011-05-11 ENCOUNTER — Emergency Department (HOSPITAL_COMMUNITY)
Admission: EM | Admit: 2011-05-11 | Discharge: 2011-05-11 | Disposition: A | Discharge status: Home / Self Care | Payer: Medicaid Other | Attending: Emergency Medicine | Admitting: Emergency Medicine

## 2011-05-11 DIAGNOSIS — M25469 Effusion, unspecified knee: Secondary | ICD-10-CM | POA: Insufficient documentation

## 2011-05-11 DIAGNOSIS — W108XXA Fall (on) (from) other stairs and steps, initial encounter: Secondary | ICD-10-CM | POA: Insufficient documentation

## 2011-05-11 DIAGNOSIS — E785 Hyperlipidemia, unspecified: Secondary | ICD-10-CM | POA: Insufficient documentation

## 2011-05-11 DIAGNOSIS — Z79899 Other long term (current) drug therapy: Secondary | ICD-10-CM | POA: Insufficient documentation

## 2011-05-11 DIAGNOSIS — E119 Type 2 diabetes mellitus without complications: Secondary | ICD-10-CM | POA: Insufficient documentation

## 2011-05-11 DIAGNOSIS — I10 Essential (primary) hypertension: Secondary | ICD-10-CM | POA: Insufficient documentation

## 2011-05-11 DIAGNOSIS — IMO0002 Reserved for concepts with insufficient information to code with codable children: Secondary | ICD-10-CM | POA: Insufficient documentation

## 2011-05-11 DIAGNOSIS — M25569 Pain in unspecified knee: Secondary | ICD-10-CM | POA: Insufficient documentation

## 2011-05-13 ENCOUNTER — Ambulatory Visit (HOSPITAL_COMMUNITY)
Admission: RE | Admit: 2011-05-13 | Discharge: 2011-05-13 | Disposition: A | Discharge status: Home / Self Care | Payer: Medicaid Other | Source: Ambulatory Visit | Attending: Orthopedic Surgery | Admitting: Orthopedic Surgery

## 2011-05-13 ENCOUNTER — Encounter (HOSPITAL_COMMUNITY)
Admission: RE | Admit: 2011-05-13 | Discharge: 2011-05-13 | Disposition: A | Discharge status: Home / Self Care | Payer: Medicaid Other | Source: Ambulatory Visit | Attending: Orthopedic Surgery | Admitting: Orthopedic Surgery

## 2011-05-13 ENCOUNTER — Other Ambulatory Visit (HOSPITAL_COMMUNITY): Payer: Self-pay | Admitting: Orthopedic Surgery

## 2011-05-13 DIAGNOSIS — S86019A Strain of unspecified Achilles tendon, initial encounter: Secondary | ICD-10-CM

## 2011-05-13 DIAGNOSIS — Z01818 Encounter for other preprocedural examination: Secondary | ICD-10-CM | POA: Insufficient documentation

## 2011-05-13 DIAGNOSIS — Z01812 Encounter for preprocedural laboratory examination: Secondary | ICD-10-CM | POA: Insufficient documentation

## 2011-05-13 DIAGNOSIS — M66369 Spontaneous rupture of flexor tendons, unspecified lower leg: Secondary | ICD-10-CM | POA: Insufficient documentation

## 2011-05-13 LAB — BASIC METABOLIC PANEL
BUN: 16 mg/dL (ref 6–23)
CO2: 30 mEq/L (ref 19–32)
Calcium: 8.5 mg/dL (ref 8.4–10.5)
Creatinine, Ser: 0.93 mg/dL (ref 0.4–1.2)
Glucose, Bld: 85 mg/dL (ref 70–99)

## 2011-05-13 LAB — CBC
MCHC: 33.1 g/dL (ref 30.0–36.0)
RDW: 14.9 % (ref 11.5–15.5)

## 2011-05-13 LAB — PROTIME-INR
INR: 1.01 (ref 0.00–1.49)
Prothrombin Time: 13.5 seconds (ref 11.6–15.2)

## 2011-05-14 ENCOUNTER — Observation Stay (HOSPITAL_COMMUNITY)
Admission: RE | Admit: 2011-05-14 | Discharge: 2011-05-16 | Disposition: A | Discharge status: Home / Self Care | Payer: Medicaid Other | Source: Ambulatory Visit | Attending: Orthopedic Surgery | Admitting: Orthopedic Surgery

## 2011-05-14 DIAGNOSIS — Z01810 Encounter for preprocedural cardiovascular examination: Secondary | ICD-10-CM | POA: Insufficient documentation

## 2011-05-14 DIAGNOSIS — I10 Essential (primary) hypertension: Secondary | ICD-10-CM | POA: Insufficient documentation

## 2011-05-14 DIAGNOSIS — IMO0002 Reserved for concepts with insufficient information to code with codable children: Principal | ICD-10-CM | POA: Insufficient documentation

## 2011-05-14 DIAGNOSIS — E119 Type 2 diabetes mellitus without complications: Secondary | ICD-10-CM | POA: Insufficient documentation

## 2011-05-14 DIAGNOSIS — X58XXXA Exposure to other specified factors, initial encounter: Secondary | ICD-10-CM | POA: Insufficient documentation

## 2011-05-14 LAB — GLUCOSE, CAPILLARY

## 2011-05-15 LAB — GLUCOSE, CAPILLARY: Glucose-Capillary: 159 mg/dL — ABNORMAL HIGH (ref 70–99)

## 2011-05-16 LAB — URINALYSIS, ROUTINE W REFLEX MICROSCOPIC
Bilirubin Urine: NEGATIVE
Nitrite: NEGATIVE
Specific Gravity, Urine: 1.012 (ref 1.005–1.030)
Urobilinogen, UA: 0.2 mg/dL (ref 0.0–1.0)

## 2011-05-16 LAB — URINE MICROSCOPIC-ADD ON

## 2011-05-16 LAB — GLUCOSE, CAPILLARY
Glucose-Capillary: 123 mg/dL — ABNORMAL HIGH (ref 70–99)
Glucose-Capillary: 114 mg/dL — ABNORMAL HIGH (ref 70–99)

## 2011-05-17 LAB — URINE CULTURE
Culture  Setup Time: 201206031502
Culture: NO GROWTH

## 2011-05-26 NOTE — Op Note (Signed)
  NAME:  Kaitlyn Castillo, Kaitlyn Castillo           ACCOUNT NO.:  [id number]  MEDICAL RECORD NO.:  [id number]           PATIENT TYPE:  O  LOCATION:  5010                         FACILITY:  [facility]  PHYSICIAN:  Burnard Bunting, M.D.    DATE OF BIRTH:  08-22-1961  DATE OF PROCEDURE:  05/14/2011 DATE OF DISCHARGE:                              OPERATIVE REPORT   PREOPERATIVE DIAGNOSIS:  Right quad tendon rupture.  POSTOPERATIVE DIAGNOSIS:  Right quad tendon rupture.  PROCEDURE:  Right quad tendon rupture repair.  [place]:  Burnard Bunting, MD  ASSISTANT:  None.  ANESTHESIA:  General endotracheal.  ESTIMATED BLOOD LOSS:  10 mL.  DRAINS:  None.  TOURNIQUET TIME:  64 minutes at 300 mmHg.  INDICATIONS:  Kaitlyn Castillo is a 50 year old female with right quad tendon rupture who presents for repair after explanation of risks and benefits of procedure in detail.  The patient was brought to the operating room where general endotracheal anesthesia was inducted. Preoperative antibiotic was administered.  Right leg was prescribed with alcohol and Betadine which allowed air to dry, prepped with DuraPrep solution and draped in a sterile manner.  Collier Flowers was used to cover the operative field.  A time-out was called.  Anterior incision was made over the knee.  Skin and subcutaneous tissue were sharply divided. Fascia overlying the quad tendon was identified.  Quad tendon rupture was confirmed.  Two #2 FiberWire sutures were placed in grasping modified Kessler fashion through the quadriceps tendon.  The end of the patella was then prepared using both a knife and a curette.  Three drill holes were placed through the patella.  The four suture strands anchoring the quad tendon were then passed through the three holes and tied with the leg in extension.  Retinaculum was repaired using a #1 Vicryl suture.  Thorough irrigation was performed of the joint and the operative field prior to repair.  Tourniquet was  released.  Bleeding points were encountered and controlled with electrocautery.  Fascia and peri-retinaculum were closed over the repair.  Skin was closed using interrupted inverted 0 Vicryl suture, 2-0 Vicryl suture, and then skin staples.  Solution of Marcaine, morphine, and clonidine was injected into the knee.  The patient tolerated the procedure well without immediate complications.  Bulky wrap and knee immobilizer was placed.  [place]:     Burnard Bunting, M.D.     GSD/MEDQ  D:  05/14/2011  T:  05/14/2011  Job:  [id number]  Electronically Signed by Reece Agar.  Quinten Allerton M.D. on 05/26/2011 03:24:15 PM

## 2011-06-03 ENCOUNTER — Other Ambulatory Visit: Payer: Self-pay | Admitting: Orthopedic Surgery

## 2011-06-04 ENCOUNTER — Other Ambulatory Visit (HOSPITAL_COMMUNITY): Payer: Self-pay | Admitting: Orthopedic Surgery

## 2011-06-04 ENCOUNTER — Other Ambulatory Visit: Payer: Medicaid Other

## 2011-06-04 DIAGNOSIS — I2699 Other pulmonary embolism without acute cor pulmonale: Secondary | ICD-10-CM

## 2011-06-07 ENCOUNTER — Ambulatory Visit (HOSPITAL_COMMUNITY)
Admission: RE | Admit: 2011-06-07 | Discharge: 2011-06-07 | Disposition: A | Discharge status: Home / Self Care | Payer: Medicaid Other | Source: Ambulatory Visit | Attending: Orthopedic Surgery | Admitting: Orthopedic Surgery

## 2011-06-07 ENCOUNTER — Encounter (HOSPITAL_COMMUNITY)
Admission: RE | Admit: 2011-06-07 | Discharge: 2011-06-07 | Disposition: A | Discharge status: Home / Self Care | Payer: Medicaid Other | Source: Ambulatory Visit | Attending: Orthopedic Surgery | Admitting: Orthopedic Surgery

## 2011-06-07 DIAGNOSIS — R079 Chest pain, unspecified: Secondary | ICD-10-CM | POA: Insufficient documentation

## 2011-06-07 DIAGNOSIS — I2699 Other pulmonary embolism without acute cor pulmonale: Secondary | ICD-10-CM

## 2011-06-07 MED ORDER — XENON XE 133 GAS
10.0000 | GAS_FOR_INHALATION | Freq: Once | RESPIRATORY_TRACT | Status: AC | PRN
  Administered 2011-06-07: 10.3 via RESPIRATORY_TRACT

## 2011-06-07 MED ORDER — TECHNETIUM TO 99M ALBUMIN AGGREGATED
6.0000 | Freq: Once | INTRAVENOUS | Status: AC | PRN
  Administered 2011-06-07: 6.6 via INTRAVENOUS

## 2011-06-08 ENCOUNTER — Other Ambulatory Visit (HOSPITAL_COMMUNITY): Payer: Medicaid Other

## 2011-06-25 NOTE — Discharge Summary (Signed)
  NAMEAUBURN, Kaitlyn Castillo           ACCOUNT NO.:  [id number]  MEDICAL RECORD NO.:  [id number]  LOCATION:  5010                         FACILITY:  [facility]  PHYSICIAN:  Burnard Bunting, M.D.    DATE OF BIRTH:  [birth year]  DATE OF ADMISSION:  05/14/2011 DATE OF DISCHARGE:  05/16/2011                              DISCHARGE SUMMARY   DISCHARGE DIAGNOSIS:  Right quad tendon rupture.  SECONDARY DIAGNOSES:  None.  OPERATION AND NOTABLE PROCEDURES:  Right quad tendon rupture repair performed on May 14, 2011.  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  She was admitted for surgery on May 14, 2011, tolerated the procedure well without immediate complications, was mobilized with physical therapy, weightbearing as tolerated, and knee immobilizer for right lower extremity, pain was well controlled after a couple of days on oral pain medicine.  She is discharged home in good condition.  She will continue weightbearing as tolerated on the right lower extremity in the knee immobilizer, follow up with me in 7 days for inspection of incision.  DISCHARGE MEDICATIONS: 1. Norco 10/325 one p.o. q.3-4 h. p.r.n. pain. 2. Aspirin 325 mg p.o. daily. 3. Robaxin 500 mg p.o. q.8 h. p.r.n. spasm. 4. Glipizide and metformin 5/500 mg one p.o. daily. 5. Simvastatin 20 mg p.o. daily. 6. Lisinopril/hydrochlorothiazide 20/25 mg one p.o. daily.  Incision was intact at the time of discharge.  She was discharged home in good condition.     Burnard Bunting, M.D.     GSD/MEDQ  D:  05/26/2011  T:  05/27/2011  Job:  [id number]  Electronically Signed by Reece Agar.  Stevi Hollinshead M.D. on 06/25/2011 05:20:59 PM

## 2011-07-07 ENCOUNTER — Ambulatory Visit: Payer: Medicaid Other | Admitting: Physical Therapy

## 2011-07-13 ENCOUNTER — Ambulatory Visit (HOSPITAL_COMMUNITY)
Admission: RE | Admit: 2011-07-13 | Discharge: 2011-07-15 | Disposition: A | Discharge status: Home / Self Care | Payer: Medicaid Other | Source: Ambulatory Visit | Attending: Orthopedic Surgery | Admitting: Orthopedic Surgery

## 2011-07-13 DIAGNOSIS — M249 Joint derangement, unspecified: Secondary | ICD-10-CM | POA: Insufficient documentation

## 2011-07-13 DIAGNOSIS — M2419 Other articular cartilage disorders, other specified site: Secondary | ICD-10-CM | POA: Insufficient documentation

## 2011-07-13 DIAGNOSIS — E119 Type 2 diabetes mellitus without complications: Secondary | ICD-10-CM | POA: Insufficient documentation

## 2011-07-13 DIAGNOSIS — I10 Essential (primary) hypertension: Secondary | ICD-10-CM | POA: Insufficient documentation

## 2011-07-13 LAB — CBC
MCH: 26.5 pg (ref 26.0–34.0)
MCHC: 32.7 g/dL (ref 30.0–36.0)
MCV: 81.1 fL (ref 78.0–100.0)
Platelets: 330 10*3/uL (ref 150–400)
RDW: 14.9 % (ref 11.5–15.5)

## 2011-07-13 LAB — GLUCOSE, CAPILLARY
Glucose-Capillary: 116 mg/dL — ABNORMAL HIGH (ref 70–99)
Glucose-Capillary: 82 mg/dL (ref 70–99)
Glucose-Capillary: 145 mg/dL — ABNORMAL HIGH (ref 70–99)

## 2011-07-13 LAB — HCG, SERUM, QUALITATIVE: Preg, Serum: NEGATIVE

## 2011-07-13 LAB — BASIC METABOLIC PANEL
BUN: 31 mg/dL — ABNORMAL HIGH (ref 6–23)
CO2: 29 mEq/L (ref 19–32)
Calcium: 9 mg/dL (ref 8.4–10.5)
Creatinine, Ser: 0.98 mg/dL (ref 0.50–1.10)
Glucose, Bld: 115 mg/dL — ABNORMAL HIGH (ref 70–99)
Sodium: 137 mEq/L (ref 135–145)

## 2011-07-13 LAB — SURGICAL PCR SCREEN
MRSA, PCR: NEGATIVE
Staphylococcus aureus: NEGATIVE

## 2011-07-14 LAB — GLUCOSE, CAPILLARY
Glucose-Capillary: 84 mg/dL (ref 70–99)
Glucose-Capillary: 107 mg/dL — ABNORMAL HIGH (ref 70–99)

## 2011-07-14 LAB — PROTIME-INR: Prothrombin Time: 14.6 seconds (ref 11.6–15.2)

## 2011-07-15 LAB — GLUCOSE, CAPILLARY
Glucose-Capillary: 147 mg/dL — ABNORMAL HIGH (ref 70–99)
Glucose-Capillary: 66 mg/dL — ABNORMAL LOW (ref 70–99)

## 2011-07-15 LAB — PROTIME-INR: INR: 1.16 (ref 0.00–1.49)

## 2011-07-15 NOTE — Op Note (Signed)
  NAMEROAN, SAWCHUK NO.:  [id number]  MEDICAL RECORD NO.:  [id number]  LOCATION:  5007                         FACILITY:  [facility]  PHYSICIAN:  Burnard Bunting, M.D.    DATE OF BIRTH:  September 23, 1961  DATE OF PROCEDURE:  07/13/2011 DATE OF DISCHARGE:                              OPERATIVE REPORT   PREOPERATIVE DIAGNOSIS:  Right knee re-rupture of quad tendon repair.  POSTOPERATIVE DIAGNOSIS:  Right knee re-rupture of quad tendon repair.  PROCEDURE:  Right knee re-repair of quad tendon rupture.  [place]:  Burnard Bunting, MD  ASSISTANT:  None.  ANESTHESIA:  General endotracheal.  ESTIMATED BLOOD LOSS:  25 mL.  DRAINS:  None.  INDICATIONS:  Kaitlyn Castillo is a patient with right knee quad rupture.  She underwent repair about 5 weeks ago, was doing well up until this weekend.  Some event occurred and she has had pain and weakness since that time.  She presents now for operative management. MRI scan does show re-rupture of the quad tendon.  The patient is a diabetic.  PROCEDURE IN DETAIL:  The patient was brought to operating room where general endotracheal anesthesia was induced, preoperative antibiotics administered.  Time-out was called.  Right leg and knee was pre-scrubbed with alcohol and Betadine which was allowed to air dry, prepped with DuraPrep solution and draped in a sterile manner.  Leg was elevated, exsanguinated with Esmarch wrap.  Total tourniquet time of 56 minutes at 300 mmHg.  Prior incision was utilized.  The patient basically had torn the 4-crossing sutures of her quadriceps repair.  The bony edges of the proximal pole of patella were curetted.  The quadriceps tendon edge was also abraded to promote healing.  At this time, two #2 fiber wires was again placed at about 12 throws in modified Kessler fashion in each of the four limbs through the tendon with a large tapered needle.  These were passed through 3 throws in the patella.  The knee  in extension, the tendon was reapproximated to the patella.  The tissue overlying the FiberWire sutures was then reinforced using about 20 #1 Vicryl sutures. The medial retinaculum had also torn and there was a significant blood at the beginning of the case.  It appeared as if it was essentially some type of fall with re-rupture of the tendon.  The medial retinaculum was repaired using #1 Vicryl suture.  Thorough irrigation was performed after the quadriceps tendon repair.  Tourniquet was released.  Bleeding points encountered with electrocautery.  Knee was kept in extension. Skin was closed using inverted 0 Vicryl suture and skin staples. Solution of Marcaine, morphine, clonidine was injected to the knee.  The patient tolerated the procedure well without immediate complications.  A custom knee brace in full extension was applied and will be kept in full extension for about 3 weeks to promote healing.  The patient tolerated the procedure well without immediate complications.     Burnard Bunting, M.D.     GSD/MEDQ  D:  07/13/2011  T:  07/14/2011  Job:  [id number]  Electronically Signed by Reece Agar.  DEAN M.D. on 07/15/2011 08:24:27 AM

## 2011-07-27 ENCOUNTER — Encounter (INDEPENDENT_AMBULATORY_CARE_PROVIDER_SITE_OTHER): Payer: Medicaid Other | Admitting: Ophthalmology

## 2011-08-25 NOTE — Discharge Summary (Signed)
  [name], [name]           ACCOUNT NO.:  [id number]  MEDICAL RECORD NO.:  [id number]  LOCATION:  5007                         FACILITY:  [facility]  PHYSICIAN:  Burnard Bunting, M.D.    DATE OF BIRTH:  [birth year]  DATE OF ADMISSION:  07/13/2011 DATE OF DISCHARGE:  07/15/2011                              DISCHARGE SUMMARY   DISCHARGE DIAGNOSES:  Right quad tendon rupture.  SECONDARY DIAGNOSES: 1. Hypertension. 2. Diabetes.  OPERATIONS AND PROCEDURES:  Right quad tendon repair on July 13, 2011.  HOSPITAL COURSE:  Kaitlyn Castillo is a 50 year old female with right quad tendon rupture.  She underwent repair on July 13, 2011, without complications.  She was started on Coumadin for DVT prophylaxis, Tylenol No. 3 for pain.  She mobilized well with physical therapy.  She was kept in a brace in full extension due to the fact that this is a re-repair of a likely traumatic re-rupture.  She was safe with physical therapy by the time of discharge.  She is discharged home in good condition on July 15, 2011, weightbearing as tolerated, in the brace.  She will follow up with me in 10 days.  DISCHARGE MEDICATIONS:  Include 1. Robaxin 500 mg p.o. q.8 h. p.r.n. spasm. 2. Coumadin 5 mg p.o. daily, to INR 2-2.5. 3. Tylenol with Codeine 1 tablet by mouth every 4 hours as needed for     pain. 4. Glipizide/metformin 5/500 one by mouth daily. 5. Lisinopril/hydrochlorothiazide 1 by mouth daily. 6. Simvastatin 1 tablet by mouth daily.     Burnard Bunting, M.D.     GSD/MEDQ  D:  07/15/2011  T:  07/15/2011  Job:  [id number]  Electronically Signed by Reece Agar.  Gera Inboden M.D. on 08/25/2011 08:31:44 AM

## 2011-08-27 ENCOUNTER — Encounter (INDEPENDENT_AMBULATORY_CARE_PROVIDER_SITE_OTHER): Payer: Medicaid Other | Admitting: Ophthalmology

## 2011-08-27 DIAGNOSIS — H43819 Vitreous degeneration, unspecified eye: Secondary | ICD-10-CM

## 2011-08-27 DIAGNOSIS — H3581 Retinal edema: Secondary | ICD-10-CM

## 2011-08-27 DIAGNOSIS — E11359 Type 2 diabetes mellitus with proliferative diabetic retinopathy without macular edema: Secondary | ICD-10-CM

## 2011-09-07 ENCOUNTER — Encounter (INDEPENDENT_AMBULATORY_CARE_PROVIDER_SITE_OTHER): Payer: Medicaid Other | Admitting: Ophthalmology

## 2011-09-07 DIAGNOSIS — H3581 Retinal edema: Secondary | ICD-10-CM

## 2011-09-08 LAB — POCT I-STAT, CHEM 8
BUN: 16
Calcium, Ion: 1.17
Chloride: 101
Creatinine, Ser: 1
Glucose, Bld: 194 — ABNORMAL HIGH
HCT: 38
Hemoglobin: 12.9
Potassium: 4.7
Sodium: 135
TCO2: 26

## 2011-09-15 LAB — CBC
HCT: 35.1 — ABNORMAL LOW
Hemoglobin: 11.3 — ABNORMAL LOW
MCHC: 32.3
MCV: 81
Platelets: 272
RBC: 4.34
RDW: 17.6 — ABNORMAL HIGH
WBC: 5

## 2011-09-15 LAB — COMPREHENSIVE METABOLIC PANEL
ALT: 19
AST: 21
Albumin: 3.5
Calcium: 8.8
GFR calc Af Amer: >60
Glucose, Bld: 111 — ABNORMAL HIGH
Potassium: 3.8
Sodium: 136
Total Protein: 7.2

## 2011-09-15 LAB — COMPREHENSIVE METABOLIC PANEL WITH GFR
Alkaline Phosphatase: 45
BUN: 14
CO2: 28
Chloride: 104
Creatinine, Ser: 0.82
GFR calc non Af Amer: >60
Total Bilirubin: 0.6

## 2011-09-15 LAB — URINALYSIS, ROUTINE W REFLEX MICROSCOPIC
Bilirubin Urine: NEGATIVE
Glucose, UA: NEGATIVE
Ketones, ur: NEGATIVE
pH: 5.5

## 2011-09-15 LAB — URINE MICROSCOPIC-ADD ON

## 2011-09-17 LAB — DIFFERENTIAL
Basophils Relative: 1
Eosinophils Absolute: 0.2
Neutrophils Relative %: 53

## 2011-09-17 LAB — COMPREHENSIVE METABOLIC PANEL
ALT: 22
AST: 21
CO2: 29
Chloride: 105
Creatinine, Ser: 0.83
GFR calc Af Amer: >60
GFR calc non Af Amer: >60
Glucose, Bld: 103 — ABNORMAL HIGH
Total Bilirubin: 0.4

## 2011-09-17 LAB — CBC
MCHC: 30.8
MCV: 83.8
Platelets: 335
WBC: 5.2

## 2011-09-17 LAB — HEMOGLOBIN A1C: Hgb A1c MFr Bld: 8.1 — ABNORMAL HIGH

## 2011-09-20 ENCOUNTER — Encounter (INDEPENDENT_AMBULATORY_CARE_PROVIDER_SITE_OTHER): Payer: Medicaid Other | Admitting: Ophthalmology

## 2011-09-20 DIAGNOSIS — H3581 Retinal edema: Secondary | ICD-10-CM

## 2011-09-20 LAB — RAPID STREP SCREEN (MED CTR MEBANE ONLY): Streptococcus, Group A Screen (Direct): NEGATIVE

## 2011-09-24 LAB — COMPREHENSIVE METABOLIC PANEL
ALT: 34
Alkaline Phosphatase: 45
CO2: 29
Glucose, Bld: 186 — ABNORMAL HIGH
Potassium: 3.8
Sodium: 135
Total Protein: 6.3

## 2011-09-24 LAB — CULTURE, BLOOD (ROUTINE X 2): Culture: NO GROWTH

## 2011-09-24 LAB — CBC
Hemoglobin: 11.4 — ABNORMAL LOW
RBC: 4.3
RDW: 15.5 — ABNORMAL HIGH
WBC: 8.3

## 2011-09-24 LAB — DIFFERENTIAL
Basophils Relative: 1
Eosinophils Absolute: 0
Monocytes Relative: 5
Neutrophils Relative %: 88 — ABNORMAL HIGH

## 2011-09-24 LAB — URINALYSIS, ROUTINE W REFLEX MICROSCOPIC
Bilirubin Urine: NEGATIVE
Glucose, UA: 100 — AB
Hgb urine dipstick: NEGATIVE
Ketones, ur: NEGATIVE
pH: 7.5

## 2011-09-27 LAB — BASIC METABOLIC PANEL
CO2: 29
Calcium: 8.8
Chloride: 102
Creatinine, Ser: 0.88
Glucose, Bld: 220 — ABNORMAL HIGH

## 2011-09-27 LAB — DIFFERENTIAL
Basophils Absolute: 0
Basophils Relative: 0
Eosinophils Absolute: 0.1
Eosinophils Relative: 1
Lymphocytes Relative: 37
Lymphs Abs: 2.2
Monocytes Absolute: 0.3
Monocytes Relative: 5
Neutro Abs: 3.3
Neutrophils Relative %: 56

## 2011-09-27 LAB — CBC
HCT: 39.5
Hemoglobin: 12.7
MCHC: 32.1
MCV: 79.8
Platelets: 318
RBC: 4.95
RDW: 15.9 — ABNORMAL HIGH
WBC: 5.8

## 2011-09-27 LAB — LIPID PANEL
HDL: 42
LDL Cholesterol: 107 — ABNORMAL HIGH
Total CHOL/HDL Ratio: 3.8
VLDL: 12

## 2011-09-27 LAB — I-STAT 8, (EC8 V) (CONVERTED LAB)
Acid-Base Excess: 1
Bicarbonate: 27.3 — ABNORMAL HIGH
HCT: 44
Operator id: 272551
pCO2, Ven: 46.4

## 2011-09-27 LAB — POCT I-STAT CREATININE
Creatinine, Ser: 0.7
Operator id: 272551

## 2011-10-27 ENCOUNTER — Ambulatory Visit: Payer: Medicaid Other | Admitting: Physical Therapy

## 2011-11-10 ENCOUNTER — Ambulatory Visit: Payer: Medicaid Other | Attending: Orthopedic Surgery

## 2011-11-10 DIAGNOSIS — M6281 Muscle weakness (generalized): Secondary | ICD-10-CM | POA: Insufficient documentation

## 2011-11-10 DIAGNOSIS — M25569 Pain in unspecified knee: Secondary | ICD-10-CM | POA: Insufficient documentation

## 2011-11-10 DIAGNOSIS — R262 Difficulty in walking, not elsewhere classified: Secondary | ICD-10-CM | POA: Insufficient documentation

## 2011-11-10 DIAGNOSIS — M25669 Stiffness of unspecified knee, not elsewhere classified: Secondary | ICD-10-CM | POA: Insufficient documentation

## 2011-11-10 DIAGNOSIS — IMO0001 Reserved for inherently not codable concepts without codable children: Secondary | ICD-10-CM | POA: Insufficient documentation

## 2011-11-17 ENCOUNTER — Ambulatory Visit: Payer: Medicaid Other | Attending: Orthopedic Surgery

## 2011-11-17 DIAGNOSIS — IMO0001 Reserved for inherently not codable concepts without codable children: Secondary | ICD-10-CM | POA: Insufficient documentation

## 2011-11-17 DIAGNOSIS — M6281 Muscle weakness (generalized): Secondary | ICD-10-CM | POA: Insufficient documentation

## 2011-11-17 DIAGNOSIS — M25569 Pain in unspecified knee: Secondary | ICD-10-CM | POA: Insufficient documentation

## 2011-11-17 DIAGNOSIS — R262 Difficulty in walking, not elsewhere classified: Secondary | ICD-10-CM | POA: Insufficient documentation

## 2011-11-17 DIAGNOSIS — M25669 Stiffness of unspecified knee, not elsewhere classified: Secondary | ICD-10-CM | POA: Insufficient documentation

## 2011-11-19 ENCOUNTER — Ambulatory Visit: Payer: Medicaid Other

## 2011-11-23 ENCOUNTER — Ambulatory Visit: Payer: Medicaid Other

## 2011-11-26 ENCOUNTER — Ambulatory Visit: Payer: Medicaid Other

## 2011-12-01 ENCOUNTER — Ambulatory Visit: Payer: Medicaid Other | Admitting: Physical Therapy

## 2011-12-08 ENCOUNTER — Ambulatory Visit: Payer: Medicaid Other

## 2011-12-10 ENCOUNTER — Ambulatory Visit: Payer: Medicaid Other | Admitting: Physical Therapy

## 2011-12-21 ENCOUNTER — Ambulatory Visit: Payer: Medicaid Other | Attending: Orthopedic Surgery | Admitting: Physical Therapy

## 2011-12-21 DIAGNOSIS — R262 Difficulty in walking, not elsewhere classified: Secondary | ICD-10-CM | POA: Insufficient documentation

## 2011-12-21 DIAGNOSIS — IMO0001 Reserved for inherently not codable concepts without codable children: Secondary | ICD-10-CM | POA: Insufficient documentation

## 2011-12-21 DIAGNOSIS — M25569 Pain in unspecified knee: Secondary | ICD-10-CM | POA: Insufficient documentation

## 2011-12-21 DIAGNOSIS — M25669 Stiffness of unspecified knee, not elsewhere classified: Secondary | ICD-10-CM | POA: Insufficient documentation

## 2011-12-21 DIAGNOSIS — M6281 Muscle weakness (generalized): Secondary | ICD-10-CM | POA: Insufficient documentation

## 2011-12-22 ENCOUNTER — Other Ambulatory Visit (HOSPITAL_BASED_OUTPATIENT_CLINIC_OR_DEPARTMENT_OTHER): Payer: Self-pay | Admitting: Internal Medicine

## 2011-12-22 DIAGNOSIS — Z1231 Encounter for screening mammogram for malignant neoplasm of breast: Secondary | ICD-10-CM

## 2011-12-24 ENCOUNTER — Encounter: Payer: Medicaid Other | Admitting: Physical Therapy

## 2011-12-28 ENCOUNTER — Ambulatory Visit: Payer: Medicaid Other | Admitting: Physical Therapy

## 2012-01-03 ENCOUNTER — Ambulatory Visit: Payer: Medicaid Other

## 2012-01-05 ENCOUNTER — Ambulatory Visit: Payer: Medicaid Other | Admitting: Physical Therapy

## 2012-01-10 ENCOUNTER — Ambulatory Visit: Payer: Medicaid Other

## 2012-01-12 ENCOUNTER — Ambulatory Visit: Payer: Medicaid Other | Admitting: Physical Therapy

## 2012-01-14 ENCOUNTER — Ambulatory Visit: Payer: Medicaid Other | Attending: Orthopedic Surgery | Admitting: Physical Therapy

## 2012-01-14 DIAGNOSIS — M25669 Stiffness of unspecified knee, not elsewhere classified: Secondary | ICD-10-CM | POA: Insufficient documentation

## 2012-01-14 DIAGNOSIS — IMO0001 Reserved for inherently not codable concepts without codable children: Secondary | ICD-10-CM | POA: Insufficient documentation

## 2012-01-14 DIAGNOSIS — M25569 Pain in unspecified knee: Secondary | ICD-10-CM | POA: Insufficient documentation

## 2012-01-14 DIAGNOSIS — R262 Difficulty in walking, not elsewhere classified: Secondary | ICD-10-CM | POA: Insufficient documentation

## 2012-01-14 DIAGNOSIS — M6281 Muscle weakness (generalized): Secondary | ICD-10-CM | POA: Insufficient documentation

## 2012-01-17 ENCOUNTER — Ambulatory Visit: Payer: Medicaid Other

## 2012-01-19 ENCOUNTER — Ambulatory Visit (INDEPENDENT_AMBULATORY_CARE_PROVIDER_SITE_OTHER): Payer: Medicaid Other | Admitting: Ophthalmology

## 2012-01-19 ENCOUNTER — Ambulatory Visit: Payer: Medicaid Other | Admitting: Physical Therapy

## 2012-01-19 DIAGNOSIS — E11359 Type 2 diabetes mellitus with proliferative diabetic retinopathy without macular edema: Secondary | ICD-10-CM

## 2012-01-19 DIAGNOSIS — Q120 Congenital cataract: Secondary | ICD-10-CM

## 2012-01-19 DIAGNOSIS — H35039 Hypertensive retinopathy, unspecified eye: Secondary | ICD-10-CM

## 2012-01-19 DIAGNOSIS — H43819 Vitreous degeneration, unspecified eye: Secondary | ICD-10-CM

## 2012-01-19 DIAGNOSIS — I10 Essential (primary) hypertension: Secondary | ICD-10-CM

## 2012-01-19 DIAGNOSIS — E1165 Type 2 diabetes mellitus with hyperglycemia: Secondary | ICD-10-CM

## 2012-01-19 DIAGNOSIS — E1139 Type 2 diabetes mellitus with other diabetic ophthalmic complication: Secondary | ICD-10-CM

## 2012-01-19 DIAGNOSIS — H2510 Age-related nuclear cataract, unspecified eye: Secondary | ICD-10-CM

## 2012-01-21 ENCOUNTER — Encounter: Payer: Medicaid Other | Admitting: Physical Therapy

## 2012-01-24 ENCOUNTER — Ambulatory Visit (HOSPITAL_BASED_OUTPATIENT_CLINIC_OR_DEPARTMENT_OTHER): Payer: Medicaid Other

## 2012-01-25 ENCOUNTER — Ambulatory Visit (HOSPITAL_BASED_OUTPATIENT_CLINIC_OR_DEPARTMENT_OTHER)
Admission: RE | Admit: 2012-01-25 | Discharge: 2012-01-25 | Disposition: A | Discharge status: Home / Self Care | Payer: Medicaid Other | Source: Ambulatory Visit | Attending: Internal Medicine | Admitting: Internal Medicine

## 2012-01-25 DIAGNOSIS — Z1231 Encounter for screening mammogram for malignant neoplasm of breast: Secondary | ICD-10-CM | POA: Insufficient documentation

## 2012-01-26 ENCOUNTER — Ambulatory Visit: Payer: Medicaid Other | Admitting: Physical Therapy

## 2012-01-28 ENCOUNTER — Ambulatory Visit: Payer: Medicaid Other | Admitting: Physical Therapy

## 2012-02-01 ENCOUNTER — Ambulatory Visit: Payer: Medicaid Other | Admitting: Physical Therapy

## 2012-02-03 ENCOUNTER — Ambulatory Visit: Payer: Medicaid Other | Admitting: Physical Therapy

## 2012-02-07 ENCOUNTER — Ambulatory Visit: Payer: Medicaid Other

## 2012-03-30 ENCOUNTER — Emergency Department (HOSPITAL_COMMUNITY)
Admission: EM | Admit: 2012-03-30 | Discharge: 2012-03-30 | Disposition: A | Discharge status: Home / Self Care | Payer: Medicaid Other | Source: Home / Self Care | Attending: Family Medicine | Admitting: Family Medicine

## 2012-03-30 ENCOUNTER — Encounter (HOSPITAL_COMMUNITY): Payer: Self-pay | Admitting: Emergency Medicine

## 2012-03-30 DIAGNOSIS — M25562 Pain in left knee: Secondary | ICD-10-CM

## 2012-03-30 DIAGNOSIS — M25569 Pain in unspecified knee: Secondary | ICD-10-CM

## 2012-03-30 HISTORY — DX: Pain in right knee: M25.561

## 2012-03-30 HISTORY — DX: Pure hypercholesterolemia, unspecified: E78.00

## 2012-03-30 HISTORY — DX: Obesity, unspecified: E66.9

## 2012-03-30 HISTORY — DX: Essential (primary) hypertension: I10

## 2012-03-30 MED ORDER — IBUPROFEN 600 MG PO TABS: 600.0000 mg | ORAL_TABLET | Freq: Three times a day (TID) | ORAL | Status: AC

## 2012-03-30 MED ORDER — IBUPROFEN 600 MG PO TABS
600.0000 mg | ORAL_TABLET | Freq: Once | ORAL | Status: AC
  Administered 2012-03-30: 600 mg via ORAL

## 2012-03-30 NOTE — ED Notes (Signed)
Left knee pain, onset Saturday.  Patient reports going for a walk, enjoyed walk, no pain while walking, but on arrival at home, noticed knee hurting.  Left knee has continued to swell and worm to touch. Patient reports this knee has hurt in various areas, but never in inner aspect of knee.

## 2012-03-30 NOTE — Discharge Instructions (Signed)
Can use ice as much as possible tonight and tomorrow. Take ibuprofen as instructed for only 5 days. Take with food as it can upset your stomach. Can also combined with Tylenol #3 as previously prescribed. Removed this leaves at nighttime and at least 3 times during the day to do rehabilitation knee exercises once your pain is improved. Avoid putting weight or prolonged standing or walking for one week. Followup with your orthopedist if persistent symptoms despite following treatment. Return here as needed

## 2012-03-30 NOTE — ED Provider Notes (Signed)
History     CSN: [id number]  Arrival date & time 03/30/12  1900   First MD Initiated Contact with Patient 03/30/12 1905      Chief Complaint  Patient presents with  . Knee Pain    (Consider location/radiation/quality/duration/timing/severity/associated sxs/prior treatment) HPI Comments: 51 year old female with a history of obesity, diabetes and status post surgery repair of the ligaments in her right knee. Here complaining of left knee pain for 5 days. Patient finished need rehabilitation exercises in February. States she has been putting more weight and stress on her good knee and this days her left knee. Her knee has been able to stand the stress on until she started to go for daily walks and because of the good weather her walks have extended. 5 days ago noticed pain in her left knee diffuse but worse medially that have persisted and she thinks her knee might be swelling inside. She usually takes Tylenol number #3 for chronic knee pain but states she has high tolerance to pain. Has not taken any pain medication today. States her diabetes has been under good control and she usually performs water aerobics and is trying to lose weight. Denies recent falls of direct blow to her tender knee.    Past Medical History  Diagnosis Date  . Knee pain, right   . Diabetes mellitus   . Hypertension   . Obese   . High cholesterol     Past Surgical History  Procedure Date  . Knee surgery     No family history on file.  History  Substance Use Topics  . Smoking status: Never Smoker   . Smokeless tobacco: Not on file  . Alcohol Use: No    OB History    Grav Para Term Preterm Abortions TAB SAB Ect Mult Living                  Review of Systems  Constitutional: Negative for fever and chills.  Musculoskeletal:       As per HPI  All other systems reviewed and are negative.    Allergies  Iohexol  Home Medications   Current Outpatient Rx  Name Route Sig Dispense Refill  .  ACETAMINOPHEN-CODEINE #3 300-30 MG PO TABS Oral Take 1 tablet by mouth every 4 (four) hours as needed.    . ATENOLOL 25 MG PO TABS Oral Take 25 mg by mouth daily.    Marland Kitchen GLIPIZIDE-METFORMIN HCL 5-500 MG PO TABS Oral Take 1 tablet by mouth 2 (two) times daily before a meal.    . LISINOPRIL-HYDROCHLOROTHIAZIDE 20-25 MG PO TABS Oral Take 1 tablet by mouth daily.    Marland Kitchen PHENTERMINE HCL 37.5 MG PO CAPS Oral Take 37.5 mg by mouth every morning.    Marland Kitchen SIMVASTATIN 40 MG PO TABS Oral Take 40 mg by mouth every evening.    . IBUPROFEN 600 MG PO TABS Oral Take 1 tablet (600 mg total) by mouth 3 (three) times daily. 15 tablet 0    BP 136/82  Pulse 67  Temp(Src) 97.9 F (36.6 C) (Oral)  Resp 20  SpO2 100%  LMP 03/13/2012  Physical Exam  Nursing note and vitals reviewed. Constitutional: She is oriented to person, place, and time. She appears well-developed and well-nourished. No distress.       obese  Eyes: Conjunctivae are normal.  Cardiovascular: Normal heart sounds.   Pulmonary/Chest: Breath sounds normal.  Musculoskeletal:       Left knee: Obese. No obvious deformity. No palpable  effusion. No skin erythema. No increased temp compared with right side. No patella dislocation. Weight bearing, no hyperlaxity. Tenderness with extreme valgus maneuver.  Negative drawer test. No crepitus. Fair range of motion.  No calf tenderness. No ankle or leg edema.  Neurological: She is alert and oriented to person, place, and time.  Skin: No rash noted.    ED Course  Procedures (including critical care time)  Labs Reviewed - No data to display No results found.   1. Knee pain, left       MDM  No significant or palpable effusion on exam. No signs of infection. Impress possible ligament strain. Treated with ibuprofen for 5 days and knee brace for support. Ice for 2 days and rest for one week. Followup with orthopedist as needed.        Sharin Grave, MD 04/01/12 2130

## 2012-05-11 ENCOUNTER — Ambulatory Visit: Payer: Medicaid Other | Attending: Sports Medicine

## 2012-05-11 DIAGNOSIS — IMO0001 Reserved for inherently not codable concepts without codable children: Secondary | ICD-10-CM | POA: Insufficient documentation

## 2012-05-11 DIAGNOSIS — M25569 Pain in unspecified knee: Secondary | ICD-10-CM | POA: Insufficient documentation

## 2012-05-11 DIAGNOSIS — R269 Unspecified abnormalities of gait and mobility: Secondary | ICD-10-CM | POA: Insufficient documentation

## 2012-07-06 ENCOUNTER — Encounter (HOSPITAL_COMMUNITY): Payer: Self-pay | Admitting: *Deleted

## 2012-07-06 DIAGNOSIS — E119 Type 2 diabetes mellitus without complications: Secondary | ICD-10-CM | POA: Diagnosis present

## 2012-07-06 DIAGNOSIS — E7800 Pure hypercholesterolemia, unspecified: Secondary | ICD-10-CM | POA: Diagnosis present

## 2012-07-06 DIAGNOSIS — N179 Acute kidney failure, unspecified: Secondary | ICD-10-CM | POA: Diagnosis present

## 2012-07-06 DIAGNOSIS — E669 Obesity, unspecified: Secondary | ICD-10-CM | POA: Diagnosis present

## 2012-07-06 DIAGNOSIS — Z6841 Body Mass Index (BMI) 40.0 and over, adult: Secondary | ICD-10-CM

## 2012-07-06 DIAGNOSIS — I10 Essential (primary) hypertension: Secondary | ICD-10-CM | POA: Diagnosis present

## 2012-07-06 DIAGNOSIS — Z79899 Other long term (current) drug therapy: Secondary | ICD-10-CM

## 2012-07-06 DIAGNOSIS — R000 Tachycardia, unspecified: Secondary | ICD-10-CM | POA: Diagnosis present

## 2012-07-06 DIAGNOSIS — L02419 Cutaneous abscess of limb, unspecified: Principal | ICD-10-CM | POA: Diagnosis present

## 2012-07-06 DIAGNOSIS — L03119 Cellulitis of unspecified part of limb: Principal | ICD-10-CM | POA: Diagnosis present

## 2012-07-06 LAB — POCT I-STAT, CHEM 8
BUN: 28 mg/dL — ABNORMAL HIGH (ref 6–23)
Calcium, Ion: 1.12 mmol/L (ref 1.12–1.23)
Chloride: 97 mEq/L (ref 96–112)
Creatinine, Ser: 1.6 mg/dL — ABNORMAL HIGH (ref 0.50–1.10)
Glucose, Bld: 189 mg/dL — ABNORMAL HIGH (ref 70–99)
HCT: 40 % (ref 36.0–46.0)
Potassium: 3.8 mEq/L (ref 3.5–5.1)

## 2012-07-06 LAB — CBC WITH DIFFERENTIAL/PLATELET
Basophils Relative: 0 % (ref 0–1)
HCT: 37.4 % (ref 36.0–46.0)
Hemoglobin: 12.8 g/dL (ref 12.0–15.0)
Lymphocytes Relative: 8 % — ABNORMAL LOW (ref 12–46)
Lymphs Abs: 0.7 10*3/uL (ref 0.7–4.0)
MCHC: 34.2 g/dL (ref 30.0–36.0)
Monocytes Absolute: 0.5 10*3/uL (ref 0.1–1.0)
Monocytes Relative: 5 % (ref 3–12)
Neutro Abs: 7.9 10*3/uL — ABNORMAL HIGH (ref 1.7–7.7)
Neutrophils Relative %: 87 % — ABNORMAL HIGH (ref 43–77)
RBC: 4.56 MIL/uL (ref 3.87–5.11)

## 2012-07-06 MED ORDER — ACETAMINOPHEN 325 MG PO TABS
650.0000 mg | ORAL_TABLET | Freq: Once | ORAL | Status: AC
  Administered 2012-07-06: 650 mg via ORAL
  Filled 2012-07-06: qty 2

## 2012-07-06 NOTE — ED Notes (Signed)
Patient with cellulitis to her left low leg that is spreading to her upper leg/groin area.

## 2012-07-07 ENCOUNTER — Inpatient Hospital Stay (HOSPITAL_COMMUNITY)
Admission: EM | Admit: 2012-07-07 | Discharge: 2012-07-09 | DRG: 603 | Disposition: A | Discharge status: Home / Self Care | Payer: Medicaid Other | Attending: Internal Medicine | Admitting: Internal Medicine

## 2012-07-07 ENCOUNTER — Encounter (HOSPITAL_COMMUNITY): Payer: Self-pay | Admitting: Internal Medicine

## 2012-07-07 DIAGNOSIS — L03116 Cellulitis of left lower limb: Secondary | ICD-10-CM

## 2012-07-07 DIAGNOSIS — N179 Acute kidney failure, unspecified: Secondary | ICD-10-CM | POA: Diagnosis present

## 2012-07-07 DIAGNOSIS — E119 Type 2 diabetes mellitus without complications: Secondary | ICD-10-CM

## 2012-07-07 DIAGNOSIS — L03119 Cellulitis of unspecified part of limb: Principal | ICD-10-CM

## 2012-07-07 DIAGNOSIS — L02419 Cutaneous abscess of limb, unspecified: Principal | ICD-10-CM

## 2012-07-07 DIAGNOSIS — I10 Essential (primary) hypertension: Secondary | ICD-10-CM

## 2012-07-07 DIAGNOSIS — F411 Generalized anxiety disorder: Secondary | ICD-10-CM

## 2012-07-07 DIAGNOSIS — M79609 Pain in unspecified limb: Secondary | ICD-10-CM

## 2012-07-07 DIAGNOSIS — E1169 Type 2 diabetes mellitus with other specified complication: Secondary | ICD-10-CM

## 2012-07-07 HISTORY — DX: Cellulitis of left lower limb: L03.116

## 2012-07-07 LAB — GLUCOSE, CAPILLARY
Glucose-Capillary: 178 mg/dL — ABNORMAL HIGH (ref 70–99)
Glucose-Capillary: 170 mg/dL — ABNORMAL HIGH (ref 70–99)
Glucose-Capillary: 177 mg/dL — ABNORMAL HIGH (ref 70–99)
Glucose-Capillary: 57 mg/dL — ABNORMAL LOW (ref 70–99)

## 2012-07-07 LAB — CBC
HCT: 32.4 % — ABNORMAL LOW (ref 36.0–46.0)
MCH: 27.7 pg (ref 26.0–34.0)
MCHC: 34 g/dL (ref 30.0–36.0)
MCV: 81.6 fL (ref 78.0–100.0)
Platelets: 196 10*3/uL (ref 150–400)
RDW: 13.9 % (ref 11.5–15.5)

## 2012-07-07 LAB — COMPREHENSIVE METABOLIC PANEL
ALT: 17 U/L (ref 0–35)
AST: 19 U/L (ref 0–37)
Albumin: 3 g/dL — ABNORMAL LOW (ref 3.5–5.2)
Alkaline Phosphatase: 37 U/L — ABNORMAL LOW (ref 39–117)
Chloride: 96 mEq/L (ref 96–112)
Potassium: 3.4 mEq/L — ABNORMAL LOW (ref 3.5–5.1)
Sodium: 133 mEq/L — ABNORMAL LOW (ref 135–145)
Total Bilirubin: 0.3 mg/dL (ref 0.3–1.2)
Total Protein: 6.4 g/dL (ref 6.0–8.3)

## 2012-07-07 LAB — URINALYSIS, ROUTINE W REFLEX MICROSCOPIC
Glucose, UA: NEGATIVE mg/dL
Leukocytes, UA: NEGATIVE
Protein, ur: NEGATIVE mg/dL
Specific Gravity, Urine: 1.01 (ref 1.005–1.030)

## 2012-07-07 MED ORDER — VANCOMYCIN HCL 1000 MG IV SOLR: 1500.0000 mg | INTRAVENOUS | Status: DC

## 2012-07-07 MED ORDER — SODIUM CHLORIDE 0.9 % IV SOLN: INTRAVENOUS | Status: DC

## 2012-07-07 MED ORDER — ACETAMINOPHEN 650 MG RE SUPP: 650.0000 mg | Freq: Four times a day (QID) | RECTAL | Status: DC | PRN

## 2012-07-07 MED ORDER — SODIUM CHLORIDE 0.9 % IV SOLN
INTRAVENOUS | Status: DC
  Administered 2012-07-07 – 2012-07-08 (×3): via INTRAVENOUS

## 2012-07-07 MED ORDER — GLIPIZIDE 5 MG PO TABS
5.0000 mg | ORAL_TABLET | Freq: Every day | ORAL | Status: DC
  Administered 2012-07-07 – 2012-07-09 (×3): 5 mg via ORAL
  Filled 2012-07-07 (×4): qty 1

## 2012-07-07 MED ORDER — VANCOMYCIN HCL 1000 MG IV SOLR
2000.0000 mg | Freq: Once | INTRAVENOUS | Status: AC
  Administered 2012-07-07: 2000 mg via INTRAVENOUS
  Filled 2012-07-07: qty 2000

## 2012-07-07 MED ORDER — CLINDAMYCIN PHOSPHATE 600 MG/50ML IV SOLN
600.0000 mg | Freq: Once | INTRAVENOUS | Status: AC
  Administered 2012-07-07: 600 mg via INTRAVENOUS
  Filled 2012-07-07: qty 50

## 2012-07-07 MED ORDER — ACETAMINOPHEN 325 MG PO TABS
650.0000 mg | ORAL_TABLET | Freq: Four times a day (QID) | ORAL | Status: DC | PRN
  Administered 2012-07-07: 650 mg via ORAL
  Filled 2012-07-07: qty 2

## 2012-07-07 MED ORDER — ONDANSETRON HCL 4 MG/2ML IJ SOLN: 4.0000 mg | Freq: Four times a day (QID) | INTRAMUSCULAR | Status: DC | PRN

## 2012-07-07 MED ORDER — ATENOLOL 25 MG PO TABS
25.0000 mg | ORAL_TABLET | Freq: Every day | ORAL | Status: DC
  Administered 2012-07-07 – 2012-07-08 (×2): 25 mg via ORAL
  Filled 2012-07-07 (×3): qty 1

## 2012-07-07 MED ORDER — TETANUS-DIPHTH-ACELL PERTUSSIS 5-2.5-18.5 LF-MCG/0.5 IM SUSP
0.5000 mL | Freq: Once | INTRAMUSCULAR | Status: AC
  Administered 2012-07-07: 0.5 mL via INTRAMUSCULAR
  Filled 2012-07-07: qty 0.5

## 2012-07-07 MED ORDER — INSULIN ASPART 100 UNIT/ML ~~LOC~~ SOLN
0.0000 [IU] | Freq: Three times a day (TID) | SUBCUTANEOUS | Status: DC
  Administered 2012-07-07 – 2012-07-09 (×3): 2 [IU] via SUBCUTANEOUS

## 2012-07-07 MED ORDER — SODIUM CHLORIDE 0.9 % IV SOLN
INTRAVENOUS | Status: DC
  Administered 2012-07-07: 04:00:00 via INTRAVENOUS

## 2012-07-07 MED ORDER — ONDANSETRON HCL 4 MG PO TABS: 4.0000 mg | ORAL_TABLET | Freq: Four times a day (QID) | ORAL | Status: DC | PRN

## 2012-07-07 MED ORDER — HYDRALAZINE HCL 20 MG/ML IJ SOLN
10.0000 mg | INTRAMUSCULAR | Status: DC | PRN
  Filled 2012-07-07: qty 0.5

## 2012-07-07 MED ORDER — SIMVASTATIN 40 MG PO TABS
40.0000 mg | ORAL_TABLET | Freq: Every evening | ORAL | Status: DC
  Administered 2012-07-07 – 2012-07-08 (×2): 40 mg via ORAL
  Filled 2012-07-07 (×3): qty 1

## 2012-07-07 MED ORDER — ENOXAPARIN SODIUM 40 MG/0.4ML ~~LOC~~ SOLN
40.0000 mg | Freq: Every day | SUBCUTANEOUS | Status: DC
  Administered 2012-07-07: 40 mg via SUBCUTANEOUS
  Filled 2012-07-07 (×3): qty 0.4

## 2012-07-07 MED ORDER — VANCOMYCIN HCL IN DEXTROSE 1-5 GM/200ML-% IV SOLN
1000.0000 mg | Freq: Two times a day (BID) | INTRAVENOUS | Status: DC
  Administered 2012-07-07 – 2012-07-09 (×4): 1000 mg via INTRAVENOUS
  Filled 2012-07-07 (×5): qty 200

## 2012-07-07 MED ORDER — POTASSIUM CHLORIDE CRYS ER 20 MEQ PO TBCR
40.0000 meq | EXTENDED_RELEASE_TABLET | Freq: Two times a day (BID) | ORAL | Status: AC
  Administered 2012-07-07 (×2): 40 meq via ORAL
  Filled 2012-07-07 (×3): qty 2

## 2012-07-07 MED ORDER — SODIUM CHLORIDE 0.9 % IJ SOLN
3.0000 mL | Freq: Two times a day (BID) | INTRAMUSCULAR | Status: DC
  Administered 2012-07-08 – 2012-07-09 (×2): 3 mL via INTRAVENOUS

## 2012-07-07 MED ORDER — LISINOPRIL 20 MG PO TABS
20.0000 mg | ORAL_TABLET | Freq: Every day | ORAL | Status: DC
  Filled 2012-07-07: qty 1

## 2012-07-07 NOTE — ED Notes (Signed)
Pt stated that she was bit by something on Tuesday. She stated that she begin having pain in the leg on Wednesday and noticed redness. She also stated that she was having chills and shaking intermittent. On Thursday she started also having nausea but no vomiting. LLE is red, warm to touch, and minimal swelling. Pedal pulses palpable. Pt able to move foot. No cardiac or respiratory distress. Will continue to monitor.

## 2012-07-07 NOTE — Progress Notes (Signed)
Patient seen and examined at bed. 51 year old lady admitted for left leg cellulitis. On IV Antibiotics. No new complaints.  Continue to monitor.    Kathlen Mody, MD [phone]

## 2012-07-07 NOTE — ED Notes (Addendum)
Transported by Augusto Gamble NT

## 2012-07-07 NOTE — Progress Notes (Signed)
  ANTIBIOTIC CONSULT NOTE - INITIAL  Pharmacy Consult for vancomycin  Indication: cellulitis  Allergies  Allergen Reactions  . Aleve (Naproxen Sodium)   . Iodine     Certain iodine in fish  . Iohexol      Code: HIVES, Desc: REPORTED HIVES, PT STATES SHE DOES FINE IF SHE HAS A BENADRYL FIRST, ARS 02/09/09   . Percocet (Oxycodone-Acetaminophen)     Patient Measurements: Height: 5\' 3"  (160 cm) Weight: 245 lb (111.131 kg) IBW/kg (Calculated) : 52.4   Vital Signs: Temp: 99 F (37.2 C) (07/26 0235) Temp src: Oral (07/26 0235) BP: 143/70 mmHg (07/26 0235) Pulse Rate: 103  (07/26 0235) Intake/Output from previous day:   Intake/Output from this shift:    Labs:  Basename 07/06/12 2309 07/06/12 2251  WBC -- 9.1  HGB 13.6 12.8  PLT -- 212  LABCREA -- --  CREATININE 1.60* --   Estimated Creatinine Clearance: 49.8 ml/min (by C-G formula based on Cr of 1.6). No results found for this basename: VANCOTROUGH:2,VANCOPEAK:2,VANCORANDOM:2,GENTTROUGH:2,GENTPEAK:2,GENTRANDOM:2,TOBRATROUGH:2,TOBRAPEAK:2,TOBRARND:2,AMIKACINPEAK:2,AMIKACINTROU:2,AMIKACIN:2, in the last 72 hours   Microbiology: No results found for this or any previous visit (from the past 720 hour(s)).  Medical History: Past Medical History  Diagnosis Date  . Knee pain, right   . Diabetes mellitus   . Hypertension   . Obese   . High cholesterol     Medications:  Scheduled:    . acetaminophen  650 mg Oral Once  . atenolol  25 mg Oral Daily  . clindamycin (CLEOCIN) IV  600 mg Intravenous Once  . enoxaparin (LOVENOX) injection  40 mg Subcutaneous Daily  . glipiZIDE  5 mg Oral QAC breakfast  . insulin aspart  0-9 Units Subcutaneous TID WC  . lisinopril  20 mg Oral Daily  . simvastatin  40 mg Oral QPM  . sodium chloride  3 mL Intravenous Q12H  . TDaP  0.5 mL Intramuscular Once  . DISCONTD: sodium chloride   Intravenous STAT   Assessment: 51 yo female admitted with cellulitis. Pharmacy consulted to manage IV  vancomycin.   Goal of Therapy:  Vancomycin trough level 10-15 mcg/ml  Plan:  1. Vancomycin 2gm IV x 1, then vancomycin 1.5 gm IV Q24H.   Emeline Gins 07/07/2012,3:11 AM

## 2012-07-07 NOTE — Progress Notes (Signed)
ANTIBIOTIC CONSULT NOTE - FOLLOW UP  Pharmacy Consult for vancomycin Indication: Cellulitis  Allergies  Allergen Reactions  . Aleve (Naproxen Sodium)   . Iodine     Certain iodine in fish  . Iohexol      Code: HIVES, Desc: REPORTED HIVES, PT STATES SHE DOES FINE IF SHE HAS A BENADRYL FIRST, ARS 02/09/09   . Percocet (Oxycodone-Acetaminophen)     Patient Measurements: Height: 5\' 3"  (160 cm) Weight: 244 lb 12.8 oz (111.041 kg) IBW/kg (Calculated) : 52.4    Vital Signs: Temp: 99.1 F (37.3 C) (07/26 1029) Temp src: Oral (07/26 1029) BP: 92/57 mmHg (07/26 1029) Pulse Rate: 96  (07/26 1029) Intake/Output from previous day: 07/25 0701 - 07/26 0700 In: 631.3 [I.V.:631.3] Out: -  Intake/Output from this shift: Total I/O In: 240 [P.O.:240] Out: -   Labs:  Basename 07/07/12 0620 07/06/12 2309 07/06/12 2251  WBC 7.6 -- 9.1  HGB 11.0* 13.6 12.8  PLT 196 -- 212  LABCREA -- -- --  CREATININE 1.22* 1.60* --   Estimated Creatinine Clearance: 65.3 ml/min (by C-G formula based on Cr of 1.22). No results found for this basename: VANCOTROUGH:2,VANCOPEAK:2,VANCORANDOM:2,GENTTROUGH:2,GENTPEAK:2,GENTRANDOM:2,TOBRATROUGH:2,TOBRAPEAK:2,TOBRARND:2,AMIKACINPEAK:2,AMIKACINTROU:2,AMIKACIN:2, in the last 72 hours   Microbiology: No results found for this or any previous visit (from the past 720 hour(s)).  Anti-infectives     Start     Dose/Rate Route Frequency Ordered Stop   07/08/12 0600   vancomycin (VANCOCIN) 1,500 mg in sodium chloride 0.9 % 500 mL IVPB        1,500 mg 250 mL/hr over 120 Minutes Intravenous Every 24 hours 07/07/12 0315     07/07/12 0400   vancomycin (VANCOCIN) 2,000 mg in sodium chloride 0.9 % 500 mL IVPB        2,000 mg 250 mL/hr over 120 Minutes Intravenous  Once 07/07/12 0315 07/07/12 0709   07/07/12 0130   clindamycin (CLEOCIN) IVPB 600 mg        600 mg 100 mL/hr over 30 Minutes Intravenous  Once 07/07/12 0121 07/07/12 0212           Assessment: Patient is a 51 y.o F on vancomycin day #1 for LE cellulitis.  Scr appears to be trending down to 1.22 (from 1.60)  With est crcl ~62.  Patient received 2gm vancomycin load this morning at 5 AM.  Goal of Therapy:  Vancomycin trough level 10-15 mcg/ml  Plan:  1) Change vancomycin to 1000mg  IV q12h 2) monitor renal function 3) check level at steady state  Kaitlyn Castillo P 07/07/2012,10:39 AM

## 2012-07-07 NOTE — H&P (Signed)
Kaitlyn Castillo is an 51 y.o. female.   Patient was seen and examined on July 26th 2013 at 2 AM. PCP - Dr. Su Hilt Bonsu. Chief Complaint: Left leg pain. HPI: 51 year-old female with known history of diabetes mellitus and hypertension felt that something may have bitten her by she was at the ER in her home 3 days ago. Slowly the left leg started getting more swollen and erythematous. Patient also had subjective feeling of fever and chills. Has left leg swelling progressed and worsened she decided to come to the ER. At this time patient has been admitted for IV antibiotics. Patient denies any chest shortness of breath nausea vomiting. She is not sure what could have bitten her.  Past Medical History  Diagnosis Date  . Knee pain, right   . Diabetes mellitus   . Hypertension   . Obese   . High cholesterol     Past Surgical History  Procedure Date  . Knee surgery     History reviewed. No pertinent family history. Social History:  reports that she has never smoked. She has never used smokeless tobacco. She reports that she does not drink alcohol or use illicit drugs.  Allergies:  Allergies  Allergen Reactions  . Aleve (Naproxen Sodium)   . Iodine     Certain iodine in fish  . Iohexol      Code: HIVES, Desc: REPORTED HIVES, PT STATES SHE DOES FINE IF SHE HAS A BENADRYL FIRST, ARS 02/09/09   . Percocet (Oxycodone-Acetaminophen)     Medications Prior to Admission  Medication Sig Dispense Refill  . atenolol (TENORMIN) 25 MG tablet Take 25 mg by mouth daily.      Marland Kitchen glipiZIDE-metformin (METAGLIP) 5-500 MG per tablet Take 1 tablet by mouth 2 (two) times daily before a meal.      . ibuprofen (ADVIL,MOTRIN) 200 MG tablet Take 200 mg by mouth every 6 (six) hours as needed. For pain      . lisinopril-hydrochlorothiazide (PRINZIDE,ZESTORETIC) 20-25 MG per tablet Take 1 tablet by mouth daily.      . phentermine 37.5 MG capsule Take 37.5 mg by mouth every morning.      . simvastatin  (ZOCOR) 40 MG tablet Take 40 mg by mouth every evening.        Results for orders placed during the hospital encounter of 07/07/12 (from the past 48 hour(s))  CBC WITH DIFFERENTIAL     Status: Abnormal   Collection Time   07/06/12 10:51 PM      Component Value Range Comment   WBC 9.1  4.0 - 10.5 K/uL    RBC 4.56  3.87 - 5.11 MIL/uL    Hemoglobin 12.8  12.0 - 15.0 g/dL    HCT 62.1  30.8 - 65.7 %    MCV 82.0  78.0 - 100.0 fL    MCH 28.1  26.0 - 34.0 pg    MCHC 34.2  30.0 - 36.0 g/dL    RDW 84.6  96.2 - 95.2 %    Platelets 212  150 - 400 K/uL    Neutrophils Relative 87 (*) 43 - 77 %    Neutro Abs 7.9 (*) 1.7 - 7.7 K/uL    Lymphocytes Relative 8 (*) 12 - 46 %    Lymphs Abs 0.7  0.7 - 4.0 K/uL    Monocytes Relative 5  3 - 12 %    Monocytes Absolute 0.5  0.1 - 1.0 K/uL    Eosinophils Relative 0  0 - 5 %    Eosinophils Absolute 0.0  0.0 - 0.7 K/uL    Basophils Relative 0  0 - 1 %    Basophils Absolute 0.0  0.0 - 0.1 K/uL   POCT I-STAT, CHEM 8     Status: Abnormal   Collection Time   07/06/12 11:09 PM      Component Value Range Comment   Sodium 134 (*) 135 - 145 mEq/L    Potassium 3.8  3.5 - 5.1 mEq/L    Chloride 97  96 - 112 mEq/L    BUN 28 (*) 6 - 23 mg/dL    Creatinine, Ser 1.30 (*) 0.50 - 1.10 mg/dL    Glucose, Bld 865 (*) 70 - 99 mg/dL    Calcium, Ion 7.84  6.96 - 1.23 mmol/L    TCO2 25  0 - 100 mmol/L    Hemoglobin 13.6  12.0 - 15.0 g/dL    HCT 29.5  28.4 - 13.2 %   GLUCOSE, CAPILLARY     Status: Abnormal   Collection Time   07/07/12  3:08 AM      Component Value Range Comment   Glucose-Capillary 178 (*) 70 - 99 mg/dL    Comment 1 Documented in Chart      Comment 2 Notify RN      No results found.  Review of Systems  Constitutional: Positive for fever.  HENT: Negative.   Eyes: Negative.   Respiratory: Negative.   Cardiovascular: Negative.   Gastrointestinal: Negative.   Genitourinary: Negative.   Musculoskeletal:       Left lower extremity swelling and pain.    Skin: Negative.   Neurological: Negative.   Endo/Heme/Allergies: Negative.   Psychiatric/Behavioral: Negative.     Blood pressure 143/70, pulse 103, temperature 99 F (37.2 C), temperature source Oral, resp. rate 20, height 5\' 3"  (1.6 m), weight 111.131 kg (245 lb), last menstrual period 05/21/2012, SpO2 100.00%. Physical Exam  Constitutional: She is oriented to person, place, and time. She appears well-developed and well-nourished. No distress.  HENT:  Head: Normocephalic and atraumatic.  Right Ear: External ear normal.  Left Ear: External ear normal.  Nose: Nose normal.  Mouth/Throat: Oropharynx is clear and moist. No oropharyngeal exudate.  Eyes: Conjunctivae are normal. Pupils are equal, round, and reactive to light. Right eye exhibits no discharge. Left eye exhibits no discharge. No scleral icterus.  Neck: Normal range of motion. Neck supple.  Cardiovascular:       Mild sinus tachycardia.  Respiratory: Effort normal and breath sounds normal. No respiratory distress. She has no wheezes. She has no rales.  GI: Soft. Bowel sounds are normal. She exhibits no distension. There is no tenderness. There is no rebound.  Musculoskeletal:       Swelling and erythema of the left leg involving an area of 10 cm from ankle upwards to midcalf.  Neurological: She is alert and oriented to person, place, and time.       Moves all extremities.  Skin: She is not diaphoretic. There is erythema (Left lower extremity.).  Psychiatric: Her behavior is normal.     Assessment/Plan #1. Cellulitis of the left lower extremity - place patient on vancomycin and cefepime. Check blood cultures. Keep left leg elevated. #2. Acute renal failure - at this time I'm holding off lisinopril HCTZ and hydrating. Check urinalysis. Closely follow metabolic panel. #3. Hypertension - for now I am placing patient on when necessary IV hydralazine for systolic blood pressure 160. I'm holding  off the lisinopril HCTZ due to renal  failure. #4. Diabetes mellitus2 - continue Glucotrol. Hold off metformin due to renal failure. Sliding-scale coverage.  CODE STATUS - full code.  Eddye Broxterman N. 07/07/2012, 3:41 AM

## 2012-07-07 NOTE — ED Notes (Signed)
Admitting at bedside 

## 2012-07-07 NOTE — Progress Notes (Signed)
Nursing Admission Note  Pt arrived to [id number] via stretcher from the ED at 0235. Daughter Crystal at bedside. A&Ox4. No distress noted. VSS. Temp 99.0. Pt states she has a very difficult time walking due to her left leg hurting when she puts weight on it. LLE is reddened and swollen below the knee; pedal pulse present. No open wounds noted. Pt laying on left side and refusing to have LLE elevated at this time. Telemetry in place. IVF infusing as ordered. Pt and daughter given education handout on PNA vaccine. Oriented to unit and surroundings. Call bell in place. Will continue to monitor. C.Jaselynn Tamas, RN.

## 2012-07-07 NOTE — Progress Notes (Signed)
CBG: 57  Treatment: 15g Carbohydrate snack  Symptoms: None  Follow-up CBG: Time:1657 CBG Result:79  Possible Reasons for Event: Inadequate meal intake and Medication regimen:   Comments/MD notified:Dr. Akula notified at 1641.  Advised to give snack and re-check blood sugar.    Thomasena Edis, Lashayla Armes [place]

## 2012-07-07 NOTE — ED Provider Notes (Signed)
History     CSN: [id number]  Arrival date & time 07/06/12  2147   First MD Initiated Contact with Patient 07/07/12 0037      Chief Complaint  Patient presents with  . Cellulitis    (Consider location/radiation/quality/duration/timing/severity/associated sxs/prior treatment) The history is provided by the patient.   patient states that she was bitten on the back of her leg 2 days ago. She states she's had redness and swelling since. She states she's developed fevers and chills. She states she feels weak all over. No nausea vomiting diarrhea. He is diabetic and has a history of infections. She states her sugars have been rather well-controlled. No difficulty breathing. She states she feels cold all the time now. She states she did not see anything that actually bit her  Past Medical History  Diagnosis Date  . Knee pain, right   . Diabetes mellitus   . Hypertension   . Obese   . High cholesterol     Past Surgical History  Procedure Date  . Knee surgery     History reviewed. No pertinent family history.  History  Substance Use Topics  . Smoking status: Never Smoker   . Smokeless tobacco: Never Used  . Alcohol Use: No    OB History    Grav Para Term Preterm Abortions TAB SAB Ect Mult Living                  Review of Systems  Constitutional: Positive for fever and chills. Negative for activity change and appetite change.  HENT: Negative for neck stiffness.   Eyes: Negative for pain.  Respiratory: Negative for chest tightness and shortness of breath.   Cardiovascular: Positive for leg swelling. Negative for chest pain.  Gastrointestinal: Negative for nausea, vomiting, abdominal pain and diarrhea.  Genitourinary: Negative for flank pain.  Musculoskeletal: Positive for myalgias. Negative for back pain.  Skin: Positive for color change. Negative for rash.  Neurological: Negative for weakness, numbness and headaches.  Psychiatric/Behavioral: Negative for behavioral  problems.    Allergies  Aleve; Iodine; Iohexol; and Percocet  Home Medications   No current outpatient prescriptions on file.  BP 143/70  Pulse 103  Temp 99.4 F (37.4 C) (Oral)  Resp 20  Ht 5\' 3"  (1.6 m)  Wt 244 lb 12.8 oz (111.041 kg)  BMI 43.36 kg/m2  SpO2 100%  LMP 05/21/2012  Physical Exam  Nursing note and vitals reviewed. Constitutional: She is oriented to person, place, and time. She appears well-developed and well-nourished.       Patient is obese  HENT:  Head: Normocephalic and atraumatic.  Eyes: EOM are normal. Pupils are equal, round, and reactive to light.  Neck: Normal range of motion. Neck supple.  Cardiovascular: Normal rate, regular rhythm and normal heart sounds.   No murmur heard. Pulmonary/Chest: Effort normal and breath sounds normal. No respiratory distress. She has no wheezes. She has no rales.  Abdominal: Soft. Bowel sounds are normal. She exhibits no distension. There is no tenderness. There is no rebound and no guarding.  Musculoskeletal: Normal range of motion. She exhibits edema and tenderness.       Erythema over left lower leg. Some induration. There is warmth. Strong dorsalis pedis pulse. No fluctuance.  Neurological: She is alert and oriented to person, place, and time. No cranial nerve deficit.  Skin: Skin is warm and dry.  Psychiatric: She has a normal mood and affect. Her speech is normal.    ED Course  Procedures (  including critical care time)  Labs Reviewed  CBC WITH DIFFERENTIAL - Abnormal; Notable for the following:    Neutrophils Relative 87 (*)     Neutro Abs 7.9 (*)     Lymphocytes Relative 8 (*)     All other components within normal limits  POCT I-STAT, CHEM 8 - Abnormal; Notable for the following:    Sodium 134 (*)     BUN 28 (*)     Creatinine, Ser 1.60 (*)     Glucose, Bld 189 (*)     All other components within normal limits  CBC - Abnormal; Notable for the following:    Hemoglobin 11.0 (*)  DELTA CHECK NOTED    HCT 32.4 (*)     All other components within normal limits  GLUCOSE, CAPILLARY - Abnormal; Notable for the following:    Glucose-Capillary 178 (*)     All other components within normal limits  CULTURE, BLOOD (ROUTINE X 2)  CULTURE, BLOOD (ROUTINE X 2)  COMPREHENSIVE METABOLIC PANEL  HEMOGLOBIN A1C  URINALYSIS, ROUTINE W REFLEX MICROSCOPIC   No results found.   1. Cellulitis of left lower leg   2. DIABETES MELLITUS, TYPE II   3. Unspecified essential hypertension       MDM  Patient with cellulitis of left lower leg. Fever of 102 here. Mild hyperglycemia and patient's creatinine is elevated to 1.6. Patient be admitted to medicine for IV antibiotics.      Lendon Collar R. Rubin Payor, MD 07/07/12 [phone]

## 2012-07-08 DIAGNOSIS — M79609 Pain in unspecified limb: Secondary | ICD-10-CM

## 2012-07-08 DIAGNOSIS — M7989 Other specified soft tissue disorders: Secondary | ICD-10-CM

## 2012-07-08 DIAGNOSIS — N179 Acute kidney failure, unspecified: Secondary | ICD-10-CM

## 2012-07-08 DIAGNOSIS — E1169 Type 2 diabetes mellitus with other specified complication: Secondary | ICD-10-CM

## 2012-07-08 LAB — BASIC METABOLIC PANEL
BUN: 16 mg/dL (ref 6–23)
CO2: 26 mEq/L (ref 19–32)
Calcium: 8.3 mg/dL — ABNORMAL LOW (ref 8.4–10.5)
Glucose, Bld: 201 mg/dL — ABNORMAL HIGH (ref 70–99)
Sodium: 139 mEq/L (ref 135–145)

## 2012-07-08 LAB — GLUCOSE, CAPILLARY
Glucose-Capillary: 159 mg/dL — ABNORMAL HIGH (ref 70–99)
Glucose-Capillary: 164 mg/dL — ABNORMAL HIGH (ref 70–99)

## 2012-07-08 MED ORDER — HYDROCODONE-ACETAMINOPHEN 5-325 MG PO TABS
1.0000 | ORAL_TABLET | ORAL | Status: DC | PRN
  Administered 2012-07-08: 1 via ORAL
  Filled 2012-07-08: qty 1

## 2012-07-08 NOTE — Progress Notes (Signed)
[facility] PROGRESS NOTE  Nury Finstad UJW:[id number] DOB: February 18, 1961 DOA: 07/07/2012 PCP: Jackie Plum, MD  Assessment/Plan: Principal Problem:  *Cellulitis of left leg Active Problems:  DIABETES MELLITUS, TYPE II  HYPERTENSION  ARF (acute renal failure)  1. Cellulitis of the left lower extremity - on day 2 of vancomycin. One of the blood cultures positive for strepto, probably contaminant, will await identification. Keep left leg elevated.  #2. Acute renal failure -  Probably pre renal , resolved with hydration and isinopril HCTZ  Have been held  .#3. Hypertension - for now I am placing patient on when necessary IV hydralazine for systolic blood pressure 160. I'm holding off the lisinopril HCTZ due to renal failure.  #4. Diabetes mellitus2 - continue Glucotrol. Hold off metformin due to renal failure. Sliding-scale coverage.  Code Status: full code Family Communication: noen at bedside Disposition Plan: HOME   Brief narrative:  51 year-old female with known history of diabetes mellitus and hypertension felt that something may have bitten her by she was at the ER in her home 3 days ago. Slowly the left leg started getting more swollen and erythematous. Patient also had subjective feeling of fever and chills. Has left leg swelling progressed and worsened she decided to come to the ER. At this time patient has been admitted for IV antibiotics  Consultants:  NONE  Procedures:  Venous duplex  Negative for DVT  Antibiotics:  Vancomycin day 2  HPI/Subjective: No new complaints.   Objective: Filed Vitals:   07/07/12 2144 07/08/12 0439 07/08/12 1009 07/08/12 1400  BP: 139/72 113/63 131/111 120/66  Pulse: 86 60 78 66  Temp: 98.6 F (37 C) 98.3 F (36.8 C) 98.4 F (36.9 C) 98.3 F (36.8 C)  TempSrc: Oral Oral Oral Oral  Resp: 18 18 18 18   Height:      Weight: 112.9 kg (248 lb 14.4 oz)     SpO2: 100% 99% 100% 100%    Intake/Output Summary (Last 24  hours) at 07/08/12 1706 Last data filed at 07/08/12 0601  Gross per 24 hour  Intake 3885.83 ml  Output      0 ml  Net 3885.83 ml    Exam:   General:  ALERT AFEBRILE, comfortable  Cardiovascular: s1s2  Respiratory: CTAB  Abdomen: SOFT NT ND BS+  Extremities: improved cellulitis on the left leg.   Data Reviewed: Basic Metabolic Panel:  Lab 07/08/12 5621 07/07/12 0620 07/06/12 2309  NA 139 133* 134*  K 4.1 3.4* 3.8  CL 105 96 97  CO2 26 27 --  GLUCOSE 201* 175* 189*  BUN 16 24* 28*  CREATININE 0.89 1.22* 1.60*  CALCIUM 8.3* 8.4 --  MG -- -- --  PHOS -- -- --   Liver Function Tests:  Lab 07/07/12 0620  AST 19  ALT 17  ALKPHOS 37*  BILITOT 0.3  PROT 6.4  ALBUMIN 3.0*   No results found for this basename: LIPASE:5,AMYLASE:5 in the last 168 hours No results found for this basename: AMMONIA:5 in the last 168 hours CBC:  Lab 07/07/12 0620 07/06/12 2309 07/06/12 2251  WBC 7.6 -- 9.1  NEUTROABS -- -- 7.9*  HGB 11.0* 13.6 12.8  HCT 32.4* 40.0 37.4  MCV 81.6 -- 82.0  PLT 196 -- 212   Cardiac Enzymes: No results found for this basename: CKTOTAL:5,CKMB:5,CKMBINDEX:5,TROPONINI:5 in the last 168 hours BNP (last 3 results) No results found for this basename: PROBNP:3 in the last 8760 hours CBG:  Lab 07/08/12 1147 07/08/12 0737 07/07/12 2151  07/07/12 1657 07/07/12 1635  GLUCAP 107* 159* 117* 79 57*    Recent Results (from the past 240 hour(s))  CULTURE, BLOOD (ROUTINE X 2)     Status: Normal (Preliminary result)   Collection Time   07/06/12 10:47 PM      Component Value Range Status Comment   Specimen Description BLOOD RIGHT ARM   Final    Special Requests BOTTLES DRAWN AEROBIC AND ANAEROBIC 10CC   Final    Culture  Setup Time 07/07/2012 03:28   Final    Culture     Final    Value: STREPTOCOCCUS SPECIES     Note: Gram Stain Report Called to,Read Back By and Verified With: JANELLE @ 2152 ON 07/07/12 BY GOLLD   Report Status PENDING   Incomplete   CULTURE,  BLOOD (ROUTINE X 2)     Status: Normal (Preliminary result)   Collection Time   07/06/12 10:55 PM      Component Value Range Status Comment   Specimen Description BLOOD LEFT ARM   Final    Special Requests BOTTLES DRAWN AEROBIC ONLY 10CC   Final    Culture  Setup Time 07/07/2012 03:28   Final    Culture     Final    Value:        BLOOD CULTURE RECEIVED NO GROWTH TO DATE CULTURE WILL BE HELD FOR 5 DAYS BEFORE ISSUING A FINAL NEGATIVE REPORT   Report Status PENDING   Incomplete      Studies: No results found.  Scheduled Meds:   . atenolol  25 mg Oral Daily  . enoxaparin (LOVENOX) injection  40 mg Subcutaneous Daily  . glipiZIDE  5 mg Oral QAC breakfast  . insulin aspart  0-9 Units Subcutaneous TID WC  . potassium chloride  40 mEq Oral BID  . simvastatin  40 mg Oral QPM  . sodium chloride  3 mL Intravenous Q12H  . vancomycin  1,000 mg Intravenous Q12H   Continuous Infusions:   . sodium chloride 125 mL/hr at 07/08/12 1641    Principal Problem:  *Cellulitis of left leg Active Problems:  DIABETES MELLITUS, TYPE II  HYPERTENSION  ARF (acute renal failure)    Time spent: 57    [facility]  [facility] Pager [phone]. If 8PM-8AM, please contact night-coverage at [email or web address], password [facility] 07/08/2012, 5:06 PM  LOS: 1 day

## 2012-07-08 NOTE — Progress Notes (Signed)
Left lower extremity venous duplex completed.  Preliminary report is negative for DVT or SVT in the left leg and right common femoral vein.  There appears to be a Baker's cyst in the left pop fossa.

## 2012-07-08 NOTE — Progress Notes (Signed)
CRITICAL VALUE ALERT  Critical value received: positive blood culture  Date of notification: 07/07/2012  Time of notification: 2152  Critical value read back:yes  Nurse who received alert:  Laurena Slimmer, RN  MD notified (1st page): Benedetto Coons  Time of first page: 0110  MD notified (2nd page):  Time of second page:  Responding MD:  Benedetto Coons  Time MD responded:  0113  No change to certain IV Vancomycin order.

## 2012-07-09 LAB — CULTURE, BLOOD (ROUTINE X 2)

## 2012-07-09 LAB — GLUCOSE, CAPILLARY: Glucose-Capillary: 160 mg/dL — ABNORMAL HIGH (ref 70–99)

## 2012-07-09 MED ORDER — HYDROCODONE-ACETAMINOPHEN 5-325 MG PO TABS: 1.0000 | ORAL_TABLET | ORAL | Status: AC | PRN

## 2012-07-09 MED ORDER — AMOXICILLIN 500 MG PO CAPS: 500.0000 mg | ORAL_CAPSULE | Freq: Two times a day (BID) | ORAL | Status: AC

## 2012-07-09 MED ORDER — DOXYCYCLINE HYCLATE 50 MG PO CAPS: 50.0000 mg | ORAL_CAPSULE | Freq: Two times a day (BID) | ORAL | Status: AC

## 2012-07-09 NOTE — Progress Notes (Signed)
Discussed discharge instructions and medications with patient. All questions answered. Sparks, Rebecca Annette RN  

## 2012-07-09 NOTE — Discharge Summary (Signed)
Physician Discharge Summary  Kaitlyn Castillo ZOX:[id number] DOB: September 12, 1961 DOA: 07/07/2012  PCP: Jackie Plum, MD  Admit date: 07/07/2012 Discharge date: 07/09/2012  Recommendations for Outpatient Follow-up:  1. Follow up with pcp as recommended.   Discharge Diagnoses:  Principal Problem:  *Cellulitis of left leg Active Problems:  DIABETES MELLITUS, TYPE II  HYPERTENSION  ARF (acute renal failure)   Discharge Condition: stable  Diet recommendation: carb modified diet.  Wt Readings from Last 3 Encounters:  07/08/12 112.9 kg (248 lb 14.4 oz)  01/10/08 128.822 kg (284 lb)  11/17/07 128.368 kg (283 lb)    History of present illness:  51 year-old female with known history of diabetes mellitus and hypertension felt that something may have bitten her by she was at the ER in her home 3 days ago. Slowly the left leg started getting more swollen and erythematous. Patient also had subjective feeling of fever and chills. Has left leg swelling progressed and worsened she decided to come to the ER. At this time patient has been admitted for IV antibiotics for cellulitis.   Hospital Course:  1. Cellulitis of the left lower extremity - completed 3 days of IV vancomycin. Her cellulitis has improved dramatically. Venous duplex of the leg ordered, was negative for DVT OR SVT.  One of the blood cultures positive for strepto, probably contaminant, Keep left leg elevated.  #2. Acute renal failure - Probably pre renal , resolved with hydration and isinopril HCTZ Have been held on admission, which can be resumed on discharge.  .#3. Hypertension - held off the lisinopril HCTZ due to renal failure. Resumed blood pressure medications on discharge. #4. Diabetes mellitus2 - continue Glucotrol. Hold off metformin due to renal failure on admission, resumed on discharge.. Sliding-scale coverage while she was in hospital.   Procedures; Venous duplex negative for DVT or SVT.    Consultations:  none  Discharge Exam: Filed Vitals:   07/09/12 0455  BP: 109/55  Pulse: 109  Temp: 98.2 F (36.8 C)  Resp: 18   Filed Vitals:   07/08/12 1400 07/08/12 1707 07/08/12 2107 07/09/12 0455  BP: 120/66 137/77 167/85 109/55  Pulse: 66 66 86 109  Temp: 98.3 F (36.8 C)  98.3 F (36.8 C) 98.2 F (36.8 C)  TempSrc: Oral Oral Oral Oral  Resp: 18 18 20 18   Height:      Weight:   112.9 kg (248 lb 14.4 oz)   SpO2: 100% 100% 100% 98%   General: ALERT AFEBRILE, comfortable  Cardiovascular: s1s2  Respiratory: CTAB  Abdomen: SOFT NT ND BS+  Extremities: improved cellulitis on the left leg.     Discharge Instructions  Discharge Orders    Future Appointments: Provider: Department: Dept Phone: Center:   08/15/2012 8:30 AM Sherrie George, MD [facility] [phone] None     Medication List  As of 07/09/2012  9:56 AM   STOP taking these medications         ibuprofen 200 MG tablet         TAKE these medications         atenolol 25 MG tablet   Commonly known as: TENORMIN   Take 25 mg by mouth daily.      glipiZIDE-metformin 5-500 MG per tablet   Commonly known as: METAGLIP   Take 1 tablet by mouth 2 (two) times daily before a meal.      lisinopril-hydrochlorothiazide 20-25 MG per tablet   Commonly known as: PRINZIDE,ZESTORETIC   Take 1 tablet by mouth  daily.      phentermine 37.5 MG capsule   Take 37.5 mg by mouth every morning.      simvastatin 40 MG tablet   Commonly known as: ZOCOR   Take 40 mg by mouth every evening.              The results of significant diagnostics from this hospitalization (including imaging, microbiology, ancillary and laboratory) are listed below for reference.    Significant Diagnostic Studies: No results found.  Microbiology: Recent Results (from the past 240 hour(s))  CULTURE, BLOOD (ROUTINE X 2)     Status: Normal (Preliminary result)   Collection Time   07/06/12 10:47 PM      Component Value Range  Status Comment   Specimen Description BLOOD RIGHT ARM   Final    Special Requests BOTTLES DRAWN AEROBIC AND ANAEROBIC 10CC   Final    Culture  Setup Time 07/07/2012 03:28   Final    Culture     Final    Value: STREPTOCOCCUS SPECIES     Note: Gram Stain Report Called to,Read Back By and Verified With: JANELLE @ 2152 ON 07/07/12 BY GOLLD   Report Status PENDING   Incomplete   CULTURE, BLOOD (ROUTINE X 2)     Status: Normal (Preliminary result)   Collection Time   07/06/12 10:55 PM      Component Value Range Status Comment   Specimen Description BLOOD LEFT ARM   Final    Special Requests BOTTLES DRAWN AEROBIC ONLY 10CC   Final    Culture  Setup Time 07/07/2012 03:28   Final    Culture     Final    Value:        BLOOD CULTURE RECEIVED NO GROWTH TO DATE CULTURE WILL BE HELD FOR 5 DAYS BEFORE ISSUING A FINAL NEGATIVE REPORT   Report Status PENDING   Incomplete      Labs: Basic Metabolic Panel:  Lab 07/08/12 1610 07/07/12 0620 07/06/12 2309  NA 139 133* 134*  K 4.1 3.4* 3.8  CL 105 96 97  CO2 26 27 --  GLUCOSE 201* 175* 189*  BUN 16 24* 28*  CREATININE 0.89 1.22* 1.60*  CALCIUM 8.3* 8.4 --  MG -- -- --  PHOS -- -- --   Liver Function Tests:  Lab 07/07/12 0620  AST 19  ALT 17  ALKPHOS 37*  BILITOT 0.3  PROT 6.4  ALBUMIN 3.0*   No results found for this basename: LIPASE:5,AMYLASE:5 in the last 168 hours No results found for this basename: AMMONIA:5 in the last 168 hours CBC:  Lab 07/07/12 0620 07/06/12 2309 07/06/12 2251  WBC 7.6 -- 9.1  NEUTROABS -- -- 7.9*  HGB 11.0* 13.6 12.8  HCT 32.4* 40.0 37.4  MCV 81.6 -- 82.0  PLT 196 -- 212   Cardiac Enzymes: No results found for this basename: CKTOTAL:5,CKMB:5,CKMBINDEX:5,TROPONINI:5 in the last 168 hours BNP: BNP (last 3 results) No results found for this basename: PROBNP:3 in the last 8760 hours CBG:  Lab 07/09/12 0746 07/08/12 2110 07/08/12 1706 07/08/12 1147 07/08/12 0737  GLUCAP 160* 164* 105* 107* 159*    Time  coordinating discharge: 35 minutes  Signed:  Isam Unrein  [facility] 07/09/2012, 9:56 AM

## 2012-07-13 LAB — CULTURE, BLOOD (ROUTINE X 2): Culture: NO GROWTH

## 2012-08-15 ENCOUNTER — Ambulatory Visit (INDEPENDENT_AMBULATORY_CARE_PROVIDER_SITE_OTHER): Payer: Medicaid Other | Admitting: Ophthalmology

## 2012-08-21 ENCOUNTER — Other Ambulatory Visit: Payer: Self-pay

## 2012-08-21 DIAGNOSIS — I872 Venous insufficiency (chronic) (peripheral): Secondary | ICD-10-CM

## 2012-09-12 ENCOUNTER — Encounter: Payer: Self-pay | Admitting: Vascular Surgery

## 2012-09-13 ENCOUNTER — Ambulatory Visit (INDEPENDENT_AMBULATORY_CARE_PROVIDER_SITE_OTHER): Payer: Medicaid Other | Admitting: Vascular Surgery

## 2012-09-13 ENCOUNTER — Encounter (INDEPENDENT_AMBULATORY_CARE_PROVIDER_SITE_OTHER): Payer: Medicaid Other | Admitting: *Deleted

## 2012-09-13 ENCOUNTER — Encounter: Payer: Self-pay | Admitting: Vascular Surgery

## 2012-09-13 VITALS — BP 137/62 | HR 78 | Ht 64.0 in | Wt 249.0 lb

## 2012-09-13 DIAGNOSIS — I872 Venous insufficiency (chronic) (peripheral): Secondary | ICD-10-CM

## 2012-09-13 NOTE — Progress Notes (Signed)
[facility]  Patient name: Kaitlyn Castillo MRN: [id number] DOB: Apr 05, 1961 Sex: female  REASON FOR CONSULT: chronic venous insufficiency of left lower extremity. Referred by Dr.Osei-Bonsu  HPI: Kaitlyn Castillo is a 51 y.o. female who states that she has had a long history of swelling in her left lower extremity. She states that in August she was in the hospital with cellulitis of her left leg and that the swelling has improved significantly since then. Weber, she's continued to have swelling in her left leg. She is unaware of any history of DVT or phlebitis. She is very active and stays on her feet most of the time. The swelling is relieved somewhat with elevation. She denies any history of ulcers in her legs.  Past Medical History  Diagnosis Date  . Knee pain, right   . Diabetes mellitus   . Hypertension   . Obese   . High cholesterol     Family History  Problem Relation Age of Onset  . Diabetes Mother   . Hyperlipidemia Mother   . Hypertension Mother   . Diabetes Father   . Hyperlipidemia Father   . Hypertension Father   . Cancer Brother   . Heart disease Brother   . Hypertension Brother   . Heart attack Brother     SOCIAL HISTORY: History  Substance Use Topics  . Smoking status: Former Smoker    Types: Cigarettes    Quit date: 12/13/1978  . Smokeless tobacco: Never Used  . Alcohol Use: No    Allergies  Allergen Reactions  . Aleve (Naproxen Sodium)   . Iodine     Certain iodine in fish  . Iohexol      Code: HIVES, Desc: REPORTED HIVES, PT STATES SHE DOES FINE IF SHE HAS A BENADRYL FIRST, ARS 02/09/09   . Percocet (Oxycodone-Acetaminophen)     Current Outpatient Prescriptions  Medication Sig Dispense Refill  . atenolol (TENORMIN) 25 MG tablet Take 25 mg by mouth daily.      Marland Kitchen glipiZIDE-metformin (METAGLIP) 5-500 MG per tablet Take 1 tablet by mouth 2 (two) times daily before a meal.      . lisinopril-hydrochlorothiazide  (PRINZIDE,ZESTORETIC) 20-25 MG per tablet Take 1 tablet by mouth daily.      . phentermine 37.5 MG capsule Take 37.5 mg by mouth every morning.      . simvastatin (ZOCOR) 40 MG tablet Take 40 mg by mouth every evening.        REVIEW OF SYSTEMS: Arly.Keller ] denotes positive finding; [  ] denotes negative finding  CARDIOVASCULAR:  [ ]  chest pain   [ ]  chest pressure   [ ]  palpitations   [ ]  orthopnea   [ ]  dyspnea on exertion   [ ]  claudication   [ ]  rest pain   [ ]  DVT   [ ]  phlebitis PULMONARY:   [ ]  productive cough   [ ]  asthma   [ ]  wheezing NEUROLOGIC:   [ ]  weakness  [ ]  paresthesias  [ ]  aphasia  [ ]  amaurosis  [ ]  dizziness HEMATOLOGIC:   [ ]  bleeding problems   [ ]  clotting disorders MUSCULOSKELETAL:  [ ]  joint pain   [ ]  joint swelling Arly.Keller ] leg swelling- left leg GASTROINTESTINAL: [ ]   blood in stool  [ ]   hematemesis GENITOURINARY:  [ ]   dysuria  [ ]   hematuria PSYCHIATRIC:  [ ]  history of major depression INTEGUMENTARY:  [ ]  rashes  [ ]   ulcers CONSTITUTIONAL:  [ ]  fever   [ ]  chills  PHYSICAL EXAM: Filed Vitals:   09/13/12 1427  BP: 137/62  Pulse: 78  Height: 5\' 4"  (1.626 m)  Weight: 249 lb (112.946 kg)  SpO2: 100%   Body mass index is 42.74 kg/(m^2). GENERAL: The patient is a well-nourished female, in no acute distress. The vital signs are documented above. CARDIOVASCULAR: There is a regular rate and rhythm. I do not detect carotid bruits. She has palpable pedal pulses bilaterally. She has bilateral lower extremity swelling more significantly on the left side. PULMONARY: There is good air exchange bilaterally without wheezing or rales. ABDOMEN: Soft and non-tender with normal pitched bowel sounds.  MUSCULOSKELETAL: There are no major deformities or cyanosis. NEUROLOGIC: No focal weakness or paresthesias are detected. SKIN: There are no ulcers or rashes noted. PSYCHIATRIC: The patient has a normal affect.  DATA:  I have reviewed her records from Dr. Rubye Oaks office. She  has a history of hypertension, depression, diabetes, hyperlipidemia, in chronic kidney disease. Her hypertension has been stable and well controlled.  I have independently interpreted her venous duplex scan in our office today which shows some incompetence of the saphenous vein in the right thigh and also some incompetence of the lesser saphenous vein.   She also has some incompetence of the deep veins in the right lower extremity. There is no evidence of DVT. Also reviewed her previous study from 07/08/2012 which showed no evidence of DVT.  MEDICAL ISSUES: This patient has evidence of chronic venous insufficiency. We have stressed the importance of intermittent leg elevation and I've instructed her on the proper positioning for this. In addition I have written her a prescription for a knee-high compression stocking with a 20-30 mm of Hg pressure gradient. Although she does have some incompetence of the greater saphenous vein on the right the vein is fairly small and therefore she is not an ideal candidate for laser ablation of the saphenous vein on the right. I've explained that this is a chronic problem and that the main stay of treatment is elevation and compression therapy. Will see her back as needed.   DICKSON,CHRISTOPHER S [facility] [place] Beeper: [phone]

## 2012-12-19 ENCOUNTER — Encounter (HOSPITAL_COMMUNITY): Payer: Self-pay | Admitting: *Deleted

## 2012-12-19 ENCOUNTER — Emergency Department (HOSPITAL_COMMUNITY)
Admission: EM | Admit: 2012-12-19 | Discharge: 2012-12-20 | Disposition: A | Discharge status: Home / Self Care | Payer: Medicaid Other | Attending: Emergency Medicine | Admitting: Emergency Medicine

## 2012-12-19 DIAGNOSIS — E7800 Pure hypercholesterolemia, unspecified: Secondary | ICD-10-CM | POA: Insufficient documentation

## 2012-12-19 DIAGNOSIS — Z79899 Other long term (current) drug therapy: Secondary | ICD-10-CM | POA: Insufficient documentation

## 2012-12-19 DIAGNOSIS — R52 Pain, unspecified: Secondary | ICD-10-CM | POA: Insufficient documentation

## 2012-12-19 DIAGNOSIS — B9789 Other viral agents as the cause of diseases classified elsewhere: Secondary | ICD-10-CM | POA: Insufficient documentation

## 2012-12-19 DIAGNOSIS — J039 Acute tonsillitis, unspecified: Secondary | ICD-10-CM | POA: Insufficient documentation

## 2012-12-19 DIAGNOSIS — I10 Essential (primary) hypertension: Secondary | ICD-10-CM | POA: Insufficient documentation

## 2012-12-19 DIAGNOSIS — E669 Obesity, unspecified: Secondary | ICD-10-CM | POA: Insufficient documentation

## 2012-12-19 DIAGNOSIS — R509 Fever, unspecified: Secondary | ICD-10-CM | POA: Insufficient documentation

## 2012-12-19 DIAGNOSIS — E119 Type 2 diabetes mellitus without complications: Secondary | ICD-10-CM | POA: Insufficient documentation

## 2012-12-19 DIAGNOSIS — R197 Diarrhea, unspecified: Secondary | ICD-10-CM | POA: Insufficient documentation

## 2012-12-19 DIAGNOSIS — R1110 Vomiting, unspecified: Secondary | ICD-10-CM | POA: Insufficient documentation

## 2012-12-19 DIAGNOSIS — Z8739 Personal history of other diseases of the musculoskeletal system and connective tissue: Secondary | ICD-10-CM | POA: Insufficient documentation

## 2012-12-19 DIAGNOSIS — B349 Viral infection, unspecified: Secondary | ICD-10-CM

## 2012-12-19 DIAGNOSIS — Z87891 Personal history of nicotine dependence: Secondary | ICD-10-CM | POA: Insufficient documentation

## 2012-12-19 LAB — RAPID STREP SCREEN (MED CTR MEBANE ONLY): Streptococcus, Group A Screen (Direct): NEGATIVE

## 2012-12-19 MED ORDER — SODIUM CHLORIDE 0.9 % IV BOLUS (SEPSIS)
1000.0000 mL | Freq: Once | INTRAVENOUS | Status: AC
  Administered 2012-12-19: 1000 mL via INTRAVENOUS

## 2012-12-19 MED ORDER — ONDANSETRON HCL 4 MG/2ML IJ SOLN
4.0000 mg | Freq: Once | INTRAMUSCULAR | Status: AC
  Administered 2012-12-19: 4 mg via INTRAVENOUS
  Filled 2012-12-19: qty 2

## 2012-12-19 NOTE — ED Notes (Signed)
sorethroat  For 2 days with body aches

## 2012-12-19 NOTE — ED Provider Notes (Signed)
History  This chart was scribed for non-physician practitioner working with Gerhard Munch, MD by Erskine Emery, ED Scribe. This patient was seen in room TR06C/TR06C and the patient's care was started at 21:39.   CSN: [id number]  Arrival date & time 12/19/12  1951   None     Chief Complaint  Patient presents with  . Sore Throat    (Consider location/radiation/quality/duration/timing/severity/associated sxs/prior treatment) The history is provided by the patient. No language interpreter was used.  Kaitlyn Castillo is a 52 y.o. female who presents to the Emergency Department complaining of a sore throat for the last 2 days. Pt reports some associated mild fever, body aches, diarrhea, and emesis. Pt reports she has had tonsillitis her whole life but her mother refused to consent to a tonsillectomy. Pt reports she is allergic to Aleve, Percocet, Iodine, and Iohexol. She had not yet had her flu shot this season.  Pt's PCP is Dr. Julio Sicks.  Past Medical History  Diagnosis Date  . Knee pain, right   . Diabetes mellitus   . Hypertension   . Obese   . High cholesterol     Past Surgical History  Procedure Date  . Knee surgery     quad    Family History  Problem Relation Age of Onset  . Diabetes Mother   . Hyperlipidemia Mother   . Hypertension Mother   . Diabetes Father   . Hyperlipidemia Father   . Hypertension Father   . Cancer Brother   . Heart disease Brother   . Hypertension Brother   . Heart attack Brother     History  Substance Use Topics  . Smoking status: Former Smoker    Types: Cigarettes    Quit date: 12/13/1978  . Smokeless tobacco: Never Used  . Alcohol Use: No    OB History    Grav Para Term Preterm Abortions TAB SAB Ect Mult Living                  Review of Systems  Constitutional: Positive for fever.       Body aches  HENT: Positive for sore throat.   Gastrointestinal: Positive for vomiting and diarrhea. Negative for nausea.  Skin:  Negative for rash.  Neurological: Negative for weakness.  All other systems reviewed and are negative.    Allergies  Aleve; Iodine; Iohexol; and Percocet  Home Medications   Current Outpatient Rx  Name  Route  Sig  Dispense  Refill  . ATENOLOL 25 MG PO TABS   Oral   Take 25 mg by mouth daily.         Marland Kitchen GLIPIZIDE-METFORMIN HCL 5-500 MG PO TABS   Oral   Take 1 tablet by mouth 2 (two) times daily before a meal.         . LISINOPRIL-HYDROCHLOROTHIAZIDE 20-25 MG PO TABS   Oral   Take 1 tablet by mouth daily.         Marland Kitchen PHENTERMINE HCL 37.5 MG PO CAPS   Oral   Take 37.5 mg by mouth every morning.         Marland Kitchen SIMVASTATIN 40 MG PO TABS   Oral   Take 40 mg by mouth every evening.           Triage Vitals: BP 135/57  Pulse 96  Temp 99.3 F (37.4 C) (Oral)  Resp 20  SpO2 99%  Physical Exam  Nursing note and vitals reviewed. Constitutional: She is oriented to person, place, and  time. She appears well-developed and well-nourished. No distress.  HENT:  Head: Normocephalic and atraumatic.  Right Ear: External ear normal.  Left Ear: External ear normal.  Mouth/Throat: No oropharyngeal exudate.       Oropharyngeal erythema with no exudate.  Eyes: EOM are normal. Pupils are equal, round, and reactive to light.  Neck: Neck supple. No tracheal deviation present.  Cardiovascular: Normal rate, regular rhythm and normal heart sounds.   Pulmonary/Chest: Effort normal. No respiratory distress.  Abdominal: Soft. She exhibits no distension.  Musculoskeletal: Normal range of motion. She exhibits no edema.  Neurological: She is alert and oriented to person, place, and time.  Skin: Skin is warm and dry.  Psychiatric: She has a normal mood and affect.    ED Course  Procedures (including critical care time) DIAGNOSTIC STUDIES: Oxygen Saturation is 99% on room air, normal by my interpretation.    COORDINATION OF CARE: 21:39--I evaluated the patient and we discussed a  treatment plan including Zofran, plenty of liquids, and OTC medications to which the pt agreed.   23:01--I rechecked the pt who is ready for discharge.  Results for orders placed during the hospital encounter of 12/19/12  RAPID STREP SCREEN      Component Value Range   Streptococcus, Group A Screen (Direct) NEGATIVE  NEGATIVE     No diagnosis found.   Viral illness with pharyngitis.  Strep screen neg.  Routine culture pending.  No cough, shortness of breath.   MDM   I personally performed the services described in this documentation, which was scribed in my presence. The recorded information has been reviewed and is accurate.       Jimmye Norman, NP 12/20/12 [phone]

## 2012-12-20 LAB — STREP A DNA PROBE

## 2012-12-20 MED ORDER — ONDANSETRON 4 MG PO TBDP: 4.0000 mg | ORAL_TABLET | Freq: Three times a day (TID) | ORAL | Status: DC | PRN

## 2012-12-20 MED ORDER — AMOXICILLIN 500 MG PO CAPS: 500.0000 mg | ORAL_CAPSULE | Freq: Three times a day (TID) | ORAL | Status: DC

## 2012-12-20 MED ORDER — ACETAMINOPHEN 325 MG PO TABS
650.0000 mg | ORAL_TABLET | Freq: Once | ORAL | Status: AC
  Administered 2012-12-20: 650 mg via ORAL
  Filled 2012-12-20: qty 2

## 2012-12-20 NOTE — ED Provider Notes (Signed)
On my exam this patient to stepped on a needle several days ago had foot pain, but was in no distress.  Gerhard Munch, MD 12/20/12 [phone]

## 2013-01-15 ENCOUNTER — Encounter (HOSPITAL_COMMUNITY): Payer: Self-pay | Admitting: *Deleted

## 2013-01-15 ENCOUNTER — Emergency Department (HOSPITAL_COMMUNITY)
Admission: EM | Admit: 2013-01-15 | Discharge: 2013-01-16 | Disposition: A | Discharge status: Home / Self Care | Payer: Medicaid Other | Attending: Emergency Medicine | Admitting: Emergency Medicine

## 2013-01-15 ENCOUNTER — Emergency Department (HOSPITAL_COMMUNITY): Payer: Medicaid Other

## 2013-01-15 DIAGNOSIS — Z8739 Personal history of other diseases of the musculoskeletal system and connective tissue: Secondary | ICD-10-CM | POA: Insufficient documentation

## 2013-01-15 DIAGNOSIS — Y9389 Activity, other specified: Secondary | ICD-10-CM | POA: Insufficient documentation

## 2013-01-15 DIAGNOSIS — E7800 Pure hypercholesterolemia, unspecified: Secondary | ICD-10-CM | POA: Insufficient documentation

## 2013-01-15 DIAGNOSIS — T148XXA Other injury of unspecified body region, initial encounter: Secondary | ICD-10-CM

## 2013-01-15 DIAGNOSIS — E119 Type 2 diabetes mellitus without complications: Secondary | ICD-10-CM | POA: Insufficient documentation

## 2013-01-15 DIAGNOSIS — I10 Essential (primary) hypertension: Secondary | ICD-10-CM | POA: Insufficient documentation

## 2013-01-15 DIAGNOSIS — IMO0002 Reserved for concepts with insufficient information to code with codable children: Secondary | ICD-10-CM | POA: Insufficient documentation

## 2013-01-15 DIAGNOSIS — Z87891 Personal history of nicotine dependence: Secondary | ICD-10-CM | POA: Insufficient documentation

## 2013-01-15 DIAGNOSIS — S139XXA Sprain of joints and ligaments of unspecified parts of neck, initial encounter: Secondary | ICD-10-CM | POA: Insufficient documentation

## 2013-01-15 DIAGNOSIS — Z79899 Other long term (current) drug therapy: Secondary | ICD-10-CM | POA: Insufficient documentation

## 2013-01-15 DIAGNOSIS — Y92410 Unspecified street and highway as the place of occurrence of the external cause: Secondary | ICD-10-CM | POA: Insufficient documentation

## 2013-01-15 DIAGNOSIS — E669 Obesity, unspecified: Secondary | ICD-10-CM | POA: Insufficient documentation
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
## 2013-01-16 MED ORDER — ACETAMINOPHEN-CODEINE #3 300-30 MG PO TABS: 1.0000 | ORAL_TABLET | Freq: Four times a day (QID) | ORAL | Status: DC | PRN

## 2013-01-16 MED ORDER — ACETAMINOPHEN-CODEINE #3 300-30 MG PO TABS
1.0000 | ORAL_TABLET | Freq: Once | ORAL | Status: AC
  Administered 2013-01-16: 1 via ORAL
  Filled 2013-01-16: qty 1
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
## 2013-01-18 ENCOUNTER — Other Ambulatory Visit: Payer: Self-pay | Admitting: Internal Medicine

## 2013-01-18 DIAGNOSIS — Z1231 Encounter for screening mammogram for malignant neoplasm of breast: Secondary | ICD-10-CM

## 2013-01-18 NOTE — ED Provider Notes (Signed)
Medical screening examination/treatment/procedure(s) were performed by non-physician practitioner and as supervising physician I was immediately available for consultation/collaboration.  Martha K Linker, MD 01/18/13 0709 

## 2013-02-21 ENCOUNTER — Ambulatory Visit: Payer: Medicaid Other

## 2013-03-27 ENCOUNTER — Ambulatory Visit
Admission: RE | Admit: 2013-03-27 | Discharge: 2013-03-27 | Disposition: A | Discharge status: Home / Self Care | Payer: Medicaid Other | Source: Ambulatory Visit | Attending: Internal Medicine | Admitting: Internal Medicine

## 2013-03-27 DIAGNOSIS — Z1231 Encounter for screening mammogram for malignant neoplasm of breast: Secondary | ICD-10-CM

## 2013-11-02 ENCOUNTER — Ambulatory Visit (INDEPENDENT_AMBULATORY_CARE_PROVIDER_SITE_OTHER): Payer: Medicaid Other | Admitting: Podiatry

## 2013-11-02 ENCOUNTER — Encounter: Payer: Self-pay | Admitting: Podiatry

## 2013-11-02 VITALS — BP 123/79 | HR 74 | Ht 64.0 in | Wt 266.0 lb

## 2013-11-02 DIAGNOSIS — M79609 Pain in unspecified limb: Secondary | ICD-10-CM

## 2013-11-02 DIAGNOSIS — M2060 Acquired deformities of toe(s), unspecified, unspecified foot: Secondary | ICD-10-CM | POA: Insufficient documentation

## 2013-11-02 DIAGNOSIS — B351 Tinea unguium: Secondary | ICD-10-CM

## 2013-11-02 DIAGNOSIS — E1149 Type 2 diabetes mellitus with other diabetic neurological complication: Secondary | ICD-10-CM

## 2013-11-02 DIAGNOSIS — M79673 Pain in unspecified foot: Secondary | ICD-10-CM | POA: Insufficient documentation

## 2013-11-02 DIAGNOSIS — L97509 Non-pressure chronic ulcer of other part of unspecified foot with unspecified severity: Secondary | ICD-10-CM

## 2013-11-02 NOTE — Patient Instructions (Signed)
Infected callus drained pus. Debrided and applied dressing. Keep the dressing for 2 days. Then keep only the pad and cover with Band aid after drying out if got wet from shower.  Return in 2 weeks.

## 2013-11-02 NOTE — Progress Notes (Signed)
52 year old female presents with painful callus and thick toe nails. Stated that a painful cyst on the back of left knee.   Objective: Pus draining callus from 2nd MPJ plantar callus right foot.  There is no associated edema or erythema.  Pedal pulses are all palpable. All nails are thick and dystrophic.   Assessment: Infected callus sub 2 left. Mycotic nails x 10. DM under control.  Plan: Debrided callus, drained pus, and specimen sent out for C&S.  The wound was then cleansed with Betadine solution and follow by aperture pad and dressing. Patient is to keep the dressing for 2 days. Then only the pad and cover with Band aid after drying out if got wet from shower.  Return in 2 weeks.

## 2013-11-15 ENCOUNTER — Encounter: Payer: Self-pay | Admitting: Obstetrics

## 2013-11-15 ENCOUNTER — Ambulatory Visit (INDEPENDENT_AMBULATORY_CARE_PROVIDER_SITE_OTHER): Payer: Medicaid Other | Admitting: Obstetrics

## 2013-11-15 VITALS — BP 113/76 | HR 77 | Temp 98.4°F | Wt 267.0 lb

## 2013-11-15 DIAGNOSIS — N760 Acute vaginitis: Secondary | ICD-10-CM

## 2013-11-15 NOTE — Progress Notes (Signed)
Subjective:     Kaitlyn Castillo is a 52 y.o. female here for a routine annual  exam.  Current complaints: Pt states that she does have some vaginal odor with no other complaints. Pt does have some knee problems and she is seeing a orthopedic for this.   Personal health questionnaire reviewed: yes.   Gynecologic History Patient's last menstrual period was 11/11/2012. Contraception: none Last Pap: 2012. Results were: normal Last mammogram: 2012. Results were: normal  Obstetric History OB History  Gravida Para Term Preterm AB SAB TAB Ectopic Multiple Living  8 4 4  4  4   4     # Outcome Date GA Lbr Len/2nd Weight Sex Delivery Anes PTL Lv  8 TRM 07/21/99    M    Y  7 TAB 1992          6 TRM 02/05/90    F    Y  5 TRM 07/28/89    M    Y  4 TRM 12/15/77    F    Y  3 TAB           2 TAB           1 TAB                The following portions of the patient's history were reviewed and updated as appropriate: allergies, current medications, past family history, past medical history, past social history, past surgical history and problem list.  Review of Systems Pertinent items are noted in HPI.    Objective:    No exam performed today, Patient rescheduled..    Assessment:    Appointment cancelled.   Plan:    Patient rescheduled appt.

## 2013-11-16 ENCOUNTER — Encounter: Payer: Self-pay | Admitting: Podiatry

## 2013-11-16 ENCOUNTER — Ambulatory Visit (INDEPENDENT_AMBULATORY_CARE_PROVIDER_SITE_OTHER): Payer: Medicaid Other | Admitting: Podiatry

## 2013-11-16 VITALS — BP 158/96 | HR 69

## 2013-11-16 DIAGNOSIS — M79609 Pain in unspecified limb: Secondary | ICD-10-CM

## 2013-11-16 DIAGNOSIS — E1169 Type 2 diabetes mellitus with other specified complication: Secondary | ICD-10-CM

## 2013-11-16 DIAGNOSIS — L97509 Non-pressure chronic ulcer of other part of unspecified foot with unspecified severity: Secondary | ICD-10-CM

## 2013-11-16 NOTE — Progress Notes (Signed)
52 year old female presents for follow up check on the right foot lesions. Patient denies of any pain or discomfort. Culture result came back and noted of having Staph Aureus.  Today the lesion was inspected. Clean and dry wound without further opening or drainage. No redness, no cellulitis noted. Outer callus like dry skin debrided. Return as needed or routine diabetic foot care in 3 months.

## 2013-11-16 NOTE — Patient Instructions (Signed)
Follow up on right foot wound. Area is clean and dry. No edema, erythema or drainage noted. Well healed right foot lesion. Return as needed or in 3 months.

## 2013-11-19 ENCOUNTER — Encounter: Payer: Self-pay | Admitting: *Deleted

## 2013-11-22 ENCOUNTER — Ambulatory Visit (INDEPENDENT_AMBULATORY_CARE_PROVIDER_SITE_OTHER): Payer: Medicaid Other | Admitting: Obstetrics

## 2013-11-22 VITALS — BP 130/85 | HR 74 | Temp 97.9°F

## 2013-11-22 DIAGNOSIS — Z0000 Encounter for general adult medical examination without abnormal findings: Secondary | ICD-10-CM

## 2013-11-22 DIAGNOSIS — Z01419 Encounter for gynecological examination (general) (routine) without abnormal findings: Secondary | ICD-10-CM

## 2013-11-22 NOTE — Progress Notes (Signed)
  Subjective:     Kaitlyn Castillo is a 52 y.o. female here for a routine exam.  Current complaints: no complaints today. Pt was in office on 11/14/13 and appt had to be rescheduled. Pt had complaints of vaginal odor but no other symptoms.  Personal health questionnaire reviewed: yes.   Gynecologic History Patient's last menstrual period was 11/11/2012. Contraception: none Last Pap: 2012. Results were: normal Last mammogram: 2012. Results were: normal  Obstetric History OB History  Gravida Para Term Preterm AB SAB TAB Ectopic Multiple Living  8 4 4  4  4   4     # Outcome Date GA Lbr Len/2nd Weight Sex Delivery Anes PTL Lv  8 TRM 07/21/99    M    Y  7 TAB 1992          6 TRM 02/05/90    F    Y  5 TRM 07/28/89    M    Y  4 TRM 12/15/77    F    Y  3 TAB           2 TAB           1 TAB                The following portions of the patient's history were reviewed and updated as appropriate: allergies, current medications, past family history, past medical history, past social history, past surgical history and problem list.  Review of Systems Pertinent items are noted in HPI.    Objective:    General appearance: alert and no distress Breasts: normal appearance, no masses or tenderness Abdomen: normal findings: soft, non-tender Pelvic: cervix normal in appearance, external genitalia normal, no adnexal masses or tenderness, no cervical motion tenderness, rectovaginal septum normal, uterus normal size, shape, and consistency and vagina normal without discharge    Assessment:    Healthy female exam.    Plan:    Follow up in: 1 year.

## 2013-11-23 ENCOUNTER — Encounter: Payer: Self-pay | Admitting: Obstetrics

## 2013-11-23 LAB — WET PREP BY MOLECULAR PROBE
Candida species: NEGATIVE
Trichomonas vaginosis: NEGATIVE

## 2013-11-23 LAB — PAP IG W/ RFLX HPV ASCU

## 2014-01-25 ENCOUNTER — Ambulatory Visit (INDEPENDENT_AMBULATORY_CARE_PROVIDER_SITE_OTHER): Payer: Medicaid Other | Admitting: Ophthalmology

## 2014-01-25 DIAGNOSIS — H2510 Age-related nuclear cataract, unspecified eye: Secondary | ICD-10-CM

## 2014-01-25 DIAGNOSIS — E11359 Type 2 diabetes mellitus with proliferative diabetic retinopathy without macular edema: Secondary | ICD-10-CM

## 2014-01-25 DIAGNOSIS — H35039 Hypertensive retinopathy, unspecified eye: Secondary | ICD-10-CM

## 2014-01-25 DIAGNOSIS — E1165 Type 2 diabetes mellitus with hyperglycemia: Secondary | ICD-10-CM

## 2014-01-25 DIAGNOSIS — H4310 Vitreous hemorrhage, unspecified eye: Secondary | ICD-10-CM

## 2014-01-25 DIAGNOSIS — I10 Essential (primary) hypertension: Secondary | ICD-10-CM

## 2014-01-25 DIAGNOSIS — H43819 Vitreous degeneration, unspecified eye: Secondary | ICD-10-CM

## 2014-01-25 DIAGNOSIS — E1139 Type 2 diabetes mellitus with other diabetic ophthalmic complication: Secondary | ICD-10-CM

## 2014-02-04 ENCOUNTER — Other Ambulatory Visit (INDEPENDENT_AMBULATORY_CARE_PROVIDER_SITE_OTHER): Payer: Medicaid Other | Admitting: Ophthalmology

## 2014-02-08 ENCOUNTER — Other Ambulatory Visit (INDEPENDENT_AMBULATORY_CARE_PROVIDER_SITE_OTHER): Payer: Medicaid Other | Admitting: Ophthalmology

## 2014-02-08 DIAGNOSIS — E1139 Type 2 diabetes mellitus with other diabetic ophthalmic complication: Secondary | ICD-10-CM

## 2014-02-08 DIAGNOSIS — E1165 Type 2 diabetes mellitus with hyperglycemia: Secondary | ICD-10-CM

## 2014-02-08 DIAGNOSIS — E11359 Type 2 diabetes mellitus with proliferative diabetic retinopathy without macular edema: Secondary | ICD-10-CM

## 2014-02-19 ENCOUNTER — Ambulatory Visit (INDEPENDENT_AMBULATORY_CARE_PROVIDER_SITE_OTHER): Payer: Medicaid Other | Admitting: Podiatry

## 2014-02-19 ENCOUNTER — Encounter: Payer: Self-pay | Admitting: Podiatry

## 2014-02-19 VITALS — BP 132/77 | HR 78

## 2014-02-19 DIAGNOSIS — E119 Type 2 diabetes mellitus without complications: Secondary | ICD-10-CM

## 2014-02-19 DIAGNOSIS — B351 Tinea unguium: Secondary | ICD-10-CM

## 2014-02-19 DIAGNOSIS — M79609 Pain in unspecified limb: Secondary | ICD-10-CM

## 2014-02-19 NOTE — Progress Notes (Signed)
53 year old diabetic female presents requesting toe nails trimmed. Stated that she exercises and follows all the instruction to keep her feet healthy.   Objective: Thick discolored toe nails x 10. All pedal pulses are palpable. Contracted lesser digits with distal clavi 2nd and 3rd bilateral. Dorsal bunion on left foot. All epicritic and tactile sensations grossly intact.   Assessment: Diabetes under control. Onychomycosis x 10. Contracted digits and bunion bilateral. History of digital ulcer.  Plan: Reviewed findings. Debrided all nails and corns.  Continue current level of care.  Return in 3 months.

## 2014-02-19 NOTE — Patient Instructions (Signed)
Seen for hypertrophic nails. All nails and calluses debrided. Return in 3 months or as needed.  

## 2014-04-24 ENCOUNTER — Encounter: Payer: Self-pay | Admitting: Podiatry

## 2014-04-24 ENCOUNTER — Ambulatory Visit (INDEPENDENT_AMBULATORY_CARE_PROVIDER_SITE_OTHER): Payer: Medicaid Other | Admitting: Podiatry

## 2014-04-24 VITALS — BP 125/76 | HR 78 | Ht 64.0 in | Wt 258.0 lb

## 2014-04-24 DIAGNOSIS — B351 Tinea unguium: Secondary | ICD-10-CM

## 2014-04-24 DIAGNOSIS — M79609 Pain in unspecified limb: Secondary | ICD-10-CM

## 2014-04-24 DIAGNOSIS — M79606 Pain in leg, unspecified: Secondary | ICD-10-CM

## 2014-04-24 DIAGNOSIS — E119 Type 2 diabetes mellitus without complications: Secondary | ICD-10-CM

## 2014-04-24 NOTE — Progress Notes (Signed)
Subjective:  52 year old diabetic female presents requesting toe nails and calluses trimmed. There are build ups at the tip of 2nd toes on both feet.   Objective: Thick discolored toe nails x 10.  All pedal pulses are palpable.  Contracted lesser digits with distal clavi 2nd and 3rd bilateral.  Dorsal bunion on left foot.  All epicritic and tactile sensations grossly intact.   Assessment: Diabetes under control.  Onychomycosis x 10.  Contracted digits and bunion bilateral.  History of digital ulcer.   Plan: Reviewed findings.  Debrided all nails and corns.  Continue current level of care.  Return in 3 months.   

## 2014-04-24 NOTE — Patient Instructions (Signed)
Debrided all calluses and nails. Return in 3 months.

## 2014-05-03 ENCOUNTER — Other Ambulatory Visit: Payer: Self-pay

## 2014-05-03 DIAGNOSIS — Z1231 Encounter for screening mammogram for malignant neoplasm of breast: Secondary | ICD-10-CM

## 2014-05-14 ENCOUNTER — Ambulatory Visit
Admission: RE | Admit: 2014-05-14 | Discharge: 2014-05-14 | Disposition: A | Discharge status: Home / Self Care | Payer: Medicaid Other | Source: Ambulatory Visit

## 2014-05-14 DIAGNOSIS — Z1231 Encounter for screening mammogram for malignant neoplasm of breast: Secondary | ICD-10-CM

## 2014-05-22 ENCOUNTER — Ambulatory Visit: Payer: Medicaid Other | Admitting: Podiatry

## 2014-06-10 ENCOUNTER — Ambulatory Visit (INDEPENDENT_AMBULATORY_CARE_PROVIDER_SITE_OTHER): Payer: Medicaid Other | Admitting: Ophthalmology

## 2014-06-10 DIAGNOSIS — H35039 Hypertensive retinopathy, unspecified eye: Secondary | ICD-10-CM

## 2014-06-10 DIAGNOSIS — E11359 Type 2 diabetes mellitus with proliferative diabetic retinopathy without macular edema: Secondary | ICD-10-CM

## 2014-06-10 DIAGNOSIS — H43819 Vitreous degeneration, unspecified eye: Secondary | ICD-10-CM

## 2014-06-10 DIAGNOSIS — E1139 Type 2 diabetes mellitus with other diabetic ophthalmic complication: Secondary | ICD-10-CM

## 2014-06-10 DIAGNOSIS — H2510 Age-related nuclear cataract, unspecified eye: Secondary | ICD-10-CM

## 2014-06-10 DIAGNOSIS — I10 Essential (primary) hypertension: Secondary | ICD-10-CM

## 2014-06-10 DIAGNOSIS — E1165 Type 2 diabetes mellitus with hyperglycemia: Secondary | ICD-10-CM

## 2014-06-19 ENCOUNTER — Encounter (INDEPENDENT_AMBULATORY_CARE_PROVIDER_SITE_OTHER): Payer: Medicaid Other | Admitting: Ophthalmology

## 2014-07-18 ENCOUNTER — Other Ambulatory Visit (HOSPITAL_COMMUNITY): Payer: Self-pay | Admitting: Orthopedic Surgery

## 2014-07-26 ENCOUNTER — Ambulatory Visit: Payer: Medicaid Other | Admitting: Podiatry

## 2014-08-02 ENCOUNTER — Encounter (HOSPITAL_COMMUNITY): Payer: Self-pay | Admitting: Pharmacy Technician

## 2014-08-02 NOTE — Pre-Procedure Instructions (Signed)
Jazara Heidecker  08/02/2014   Your procedure is scheduled on:  Tuesday, September 1.  Report to [facility] Admitting at 5:30 AM.  Call this number if you have problems the morning of surgery: [phone]   Remember:   Do not eat food or drink liquids after midnight Monday, August 31.   Take these medicines the morning of surgery with A SIP OF WATER: atenolol (TENORMIN).               May take acetaminophen-codeine (TYLENOL #3) if needed.                Stop taking phentermine 37.5 ,  diclofenac sodium (VOLTAREN) 1 % GEL Today.             Do not wear jewelry, make-up or nail polish.  Do not wear lotions, powders, or perfumes.  Do not shave 48 hours prior to surgery.   Do not bring valuables to the hospital.              [facility] is not responsible for any belongings or valuables.               Contacts, dentures or bridgework may not be worn into surgery.  Leave suitcase in the car. After surgery it may be brought to your room.  For patients admitted to the hospital, discharge time is determined by your  treatment team.               Special Instructions: -   Please read over the following fact sheets that you were given: Pain Booklet, Coughing and Deep Breathing, Blood Transfusion Information and Surgical Site Infection Prevention

## 2014-08-05 ENCOUNTER — Ambulatory Visit (HOSPITAL_COMMUNITY)
Admission: RE | Admit: 2014-08-05 | Discharge: 2014-08-05 | Disposition: A | Discharge status: Home / Self Care | Payer: Medicaid Other | Source: Ambulatory Visit | Attending: Orthopedic Surgery | Admitting: Orthopedic Surgery

## 2014-08-05 ENCOUNTER — Encounter (HOSPITAL_COMMUNITY): Payer: Self-pay

## 2014-08-05 ENCOUNTER — Encounter (HOSPITAL_COMMUNITY)
Admission: RE | Admit: 2014-08-05 | Discharge: 2014-08-05 | Disposition: A | Discharge status: Home / Self Care | Payer: Medicaid Other | Source: Ambulatory Visit | Attending: Orthopedic Surgery | Admitting: Orthopedic Surgery

## 2014-08-05 DIAGNOSIS — I10 Essential (primary) hypertension: Secondary | ICD-10-CM | POA: Diagnosis present

## 2014-08-05 DIAGNOSIS — E7800 Pure hypercholesterolemia, unspecified: Secondary | ICD-10-CM | POA: Insufficient documentation

## 2014-08-05 DIAGNOSIS — E119 Type 2 diabetes mellitus without complications: Secondary | ICD-10-CM | POA: Diagnosis not present

## 2014-08-05 HISTORY — DX: Unspecified intracranial injury with loss of consciousness of 30 minutes or less, initial encounter: S06.9X1A

## 2014-08-05 HISTORY — DX: Unspecified intracranial injury with loss of consciousness of unspecified duration, initial encounter: S06.9X9A

## 2014-08-05 HISTORY — DX: Constipation, unspecified: K59.00

## 2014-08-05 HISTORY — DX: Unspecified osteoarthritis, unspecified site: M19.90

## 2014-08-05 HISTORY — DX: Shortness of breath: R06.02

## 2014-08-05 LAB — SURGICAL PCR SCREEN
MRSA, PCR: POSITIVE — AB
STAPHYLOCOCCUS AUREUS: POSITIVE — AB

## 2014-08-05 LAB — URINALYSIS, ROUTINE W REFLEX MICROSCOPIC
Bilirubin Urine: NEGATIVE
GLUCOSE, UA: NEGATIVE mg/dL
Hgb urine dipstick: NEGATIVE
Ketones, ur: NEGATIVE mg/dL
LEUKOCYTES UA: NEGATIVE
Nitrite: NEGATIVE
PH: 5.5 (ref 5.0–8.0)
Protein, ur: NEGATIVE mg/dL
SPECIFIC GRAVITY, URINE: 1.023 (ref 1.005–1.030)
Urobilinogen, UA: 0.2 mg/dL (ref 0.0–1.0)

## 2014-08-05 LAB — CBC
HCT: 38.2 % (ref 36.0–46.0)
HEMOGLOBIN: 12.5 g/dL (ref 12.0–15.0)
MCH: 26.8 pg (ref 26.0–34.0)
MCHC: 32.7 g/dL (ref 30.0–36.0)
MCV: 82 fL (ref 78.0–100.0)
Platelets: 221 10*3/uL (ref 150–400)
RBC: 4.66 MIL/uL (ref 3.87–5.11)
RDW: 14.8 % (ref 11.5–15.5)
WBC: 3.7 10*3/uL — ABNORMAL LOW (ref 4.0–10.5)

## 2014-08-05 LAB — TYPE AND SCREEN
ABO/RH(D): O POS
Antibody Screen: NEGATIVE

## 2014-08-05 LAB — BASIC METABOLIC PANEL
Anion gap: 11 (ref 5–15)
BUN: 21 mg/dL (ref 6–23)
CO2: 27 mEq/L (ref 19–32)
Calcium: 9.3 mg/dL (ref 8.4–10.5)
Chloride: 102 mEq/L (ref 96–112)
Creatinine, Ser: 0.97 mg/dL (ref 0.50–1.10)
GFR calc Af Amer: 76 mL/min — ABNORMAL LOW (ref >90)
GFR calc non Af Amer: 66 mL/min — ABNORMAL LOW (ref >90)
Glucose, Bld: 99 mg/dL (ref 70–99)
POTASSIUM: 3.7 meq/L (ref 3.7–5.3)
Sodium: 140 mEq/L (ref 137–147)

## 2014-08-05 LAB — ABO/RH: ABO/RH(D): O POS

## 2014-08-05 NOTE — Progress Notes (Signed)
I called a prescription for Mupirocin ointment to [place], [place], [place].

## 2014-08-05 NOTE — Progress Notes (Signed)
Patient reports that she took Phentermine today, I did not take it for a few days, but I took it today.  Ms Cordaro reports that she only takes Tenormin when she takes Phentermine. I instructed patient to stop Phentermine now.  I requested EKG and last office note from Dr Emilee Hero- Bonsu.  I will leave chart for Ebony Hail to view.  Patient denies any chest discomfort, shortness of breath- "except when she has too much fluid", not a problem now.

## 2014-08-06 ENCOUNTER — Encounter (HOSPITAL_COMMUNITY): Payer: Self-pay

## 2014-08-06 NOTE — Progress Notes (Addendum)
Anesthesia Chart Review:  Patient is a 53 year old female scheduled for left TKA on 08/13/14 by Dr. Marlou Sa.  History includes former smoker (quit 1980), HTN, DM2, hypercholesterolemia, arthritis, closed head injury (not recent). She denied history of OSA. BMI is consistent with morbid obesity. PCP is Dr. Benito Mccreedy, last visit 06/10/14.  Meds include phentermine, glipizide-metformin, exenatide, atenolol (takes when she takes phentermine). She had not taken phentermine for days, but took one dose on 08/05/14 and has been told to hold it until after her surgery.     She thought she had an EKG at her PCP office within the past year, but report (no tracing sent) showed it was actually done on 06/18/13 and showed SR. I called and spoke with patient.  Since she has known DM, HTN I asked that she come in prior to the day of surgery to get an updated EKG.  She thought she could come in tomorrow morning.  She denied history of chest pain.  She denies SOB or new exertional dyspnea.  She gets occasional LE edema which is controlled with diuretic therapy.  She was doing water aerobics until a few weeks ago when her knee became more painful prompting an MRI.     CXR on 08/05/14 showed: No acute cardiopulmonary abnormality.  Preoperative labs noted.  Urine culture showed > 100,000 colonies Staph species (coagulase negative). PT/PTT were not done at PAT, so they will need to be done prior to surgery.  I routed urine culture results to Dr. Marlou Sa and called report to Maudie Mercury at his office. I also notified patient of preliminary urine culture results.  She denied any urinary symptoms such as dysuria or itching.  As long as she can arrange transportation, she will come in tomorrow for EKG and PT/PTT.  I'll follow-up results once available.  [facility] Short Stay Center/Anesthesiology Phone [phone] 08/06/2014 5:00 PM  Addendum 08/07/2014 12:36 PM:  Patient came in today for preoperative EKG and  PT/PTT.  EKG showed NSR. PT/PTT WNL. She is planning to follow-up with Dr. Randel Pigg office today regarding any recommendations for urine culture results.  If no acute changes then I would anticipate that she could proceed as planned.

## 2014-08-07 ENCOUNTER — Encounter (HOSPITAL_COMMUNITY)
Admission: RE | Admit: 2014-08-07 | Discharge: 2014-08-07 | Disposition: A | Discharge status: Home / Self Care | Payer: Medicaid Other | Source: Ambulatory Visit | Attending: Orthopedic Surgery | Admitting: Orthopedic Surgery

## 2014-08-07 DIAGNOSIS — Z01818 Encounter for other preprocedural examination: Secondary | ICD-10-CM | POA: Diagnosis present

## 2014-08-07 DIAGNOSIS — M1710 Unilateral primary osteoarthritis, unspecified knee: Secondary | ICD-10-CM | POA: Insufficient documentation

## 2014-08-07 LAB — URINE CULTURE

## 2014-08-07 LAB — PROTIME-INR
INR: 1.04 (ref 0.00–1.49)
Prothrombin Time: 13.6 seconds (ref 11.6–15.2)

## 2014-08-07 LAB — APTT: aPTT: 29 seconds (ref 24–37)

## 2014-08-09 ENCOUNTER — Encounter: Payer: Self-pay | Admitting: Podiatry

## 2014-08-09 ENCOUNTER — Ambulatory Visit (INDEPENDENT_AMBULATORY_CARE_PROVIDER_SITE_OTHER): Payer: Medicaid Other | Admitting: Podiatry

## 2014-08-09 VITALS — BP 134/75 | HR 83 | Ht 64.0 in | Wt 268.0 lb

## 2014-08-09 DIAGNOSIS — B351 Tinea unguium: Secondary | ICD-10-CM | POA: Diagnosis not present

## 2014-08-09 DIAGNOSIS — M79606 Pain in leg, unspecified: Secondary | ICD-10-CM

## 2014-08-09 DIAGNOSIS — M79609 Pain in unspecified limb: Secondary | ICD-10-CM

## 2014-08-09 NOTE — Patient Instructions (Signed)
Seen for hypertrophic nails and calluses. All nails and calluses debrided. Return in 3 months or as needed.  

## 2014-08-09 NOTE — Progress Notes (Signed)
Subjective:  53 year old diabetic female presents requesting toe nails and calluses trimmed. There are build ups at the tip of 2nd toes on both feet.   Objective: Thick discolored toe nails x 10.  All pedal pulses are palpable.  Contracted lesser digits with distal clavi 2nd and 3rd bilateral.  Dorsal bunion on left foot.  All epicritic and tactile sensations grossly intact.   Assessment: Diabetes under control.  Onychomycosis x 10.  Contracted digits and bunion bilateral.  History of digital ulcer.   Plan: Reviewed findings.  Debrided all nails and corns.  Continue current level of care.  Return in 3 months.

## 2014-08-12 MED ORDER — DEXTROSE 5 % IV SOLN
3.0000 g | INTRAVENOUS | Status: AC
  Administered 2014-08-13: 3 g via INTRAVENOUS
  Filled 2014-08-12: qty 3000

## 2014-08-13 ENCOUNTER — Encounter (HOSPITAL_COMMUNITY): Payer: Self-pay | Admitting: *Deleted

## 2014-08-13 ENCOUNTER — Encounter (HOSPITAL_COMMUNITY)
Admission: RE | Disposition: A | Discharge status: Home / Self Care | Payer: Self-pay | Source: Ambulatory Visit | Attending: Orthopedic Surgery

## 2014-08-13 ENCOUNTER — Encounter (HOSPITAL_COMMUNITY): Payer: Medicaid Other | Admitting: Vascular Surgery

## 2014-08-13 ENCOUNTER — Inpatient Hospital Stay (HOSPITAL_COMMUNITY)
Admission: RE | Admit: 2014-08-13 | Discharge: 2014-08-16 | DRG: 470 | Disposition: A | Discharge status: Home / Self Care | Payer: Medicaid Other | Source: Ambulatory Visit | Attending: Orthopedic Surgery | Admitting: Orthopedic Surgery

## 2014-08-13 ENCOUNTER — Inpatient Hospital Stay (HOSPITAL_COMMUNITY): Payer: Medicaid Other | Admitting: Anesthesiology

## 2014-08-13 DIAGNOSIS — I872 Venous insufficiency (chronic) (peripheral): Secondary | ICD-10-CM | POA: Diagnosis present

## 2014-08-13 DIAGNOSIS — Z6841 Body Mass Index (BMI) 40.0 and over, adult: Secondary | ICD-10-CM | POA: Diagnosis not present

## 2014-08-13 DIAGNOSIS — E1169 Type 2 diabetes mellitus with other specified complication: Secondary | ICD-10-CM | POA: Diagnosis present

## 2014-08-13 DIAGNOSIS — I10 Essential (primary) hypertension: Secondary | ICD-10-CM | POA: Diagnosis present

## 2014-08-13 DIAGNOSIS — Z79899 Other long term (current) drug therapy: Secondary | ICD-10-CM

## 2014-08-13 DIAGNOSIS — M1710 Unilateral primary osteoarthritis, unspecified knee: Principal | ICD-10-CM | POA: Diagnosis present

## 2014-08-13 DIAGNOSIS — E669 Obesity, unspecified: Secondary | ICD-10-CM | POA: Diagnosis present

## 2014-08-13 DIAGNOSIS — F411 Generalized anxiety disorder: Secondary | ICD-10-CM | POA: Diagnosis present

## 2014-08-13 DIAGNOSIS — M25569 Pain in unspecified knee: Secondary | ICD-10-CM | POA: Diagnosis present

## 2014-08-13 HISTORY — PX: TOTAL KNEE ARTHROPLASTY: SHX125

## 2014-08-13 LAB — GLUCOSE, CAPILLARY
Glucose-Capillary: 138 mg/dL — ABNORMAL HIGH (ref 70–99)
Glucose-Capillary: 124 mg/dL — ABNORMAL HIGH (ref 70–99)
Glucose-Capillary: 164 mg/dL — ABNORMAL HIGH (ref 70–99)
Glucose-Capillary: 158 mg/dL — ABNORMAL HIGH (ref 70–99)

## 2014-08-13 SURGERY — ARTHROPLASTY, KNEE, TOTAL
Anesthesia: General | Site: Knee | Laterality: Left

## 2014-08-13 MED ORDER — ONDANSETRON HCL 4 MG/2ML IJ SOLN
INTRAMUSCULAR | Status: DC | PRN
  Administered 2014-08-13 (×2): 4 mg via INTRAVENOUS

## 2014-08-13 MED ORDER — FENTANYL CITRATE 0.05 MG/ML IJ SOLN
INTRAMUSCULAR | Status: AC
  Filled 2014-08-13: qty 5

## 2014-08-13 MED ORDER — MIDAZOLAM HCL 2 MG/2ML IJ SOLN
INTRAMUSCULAR | Status: AC
  Filled 2014-08-13: qty 2

## 2014-08-13 MED ORDER — WARFARIN VIDEO: Freq: Once | Status: DC

## 2014-08-13 MED ORDER — BUPIVACAINE LIPOSOME 1.3 % IJ SUSP
20.0000 mL | Freq: Once | INTRAMUSCULAR | Status: DC
  Filled 2014-08-13: qty 20

## 2014-08-13 MED ORDER — HYDROMORPHONE HCL PF 1 MG/ML IJ SOLN: 0.2500 mg | INTRAMUSCULAR | Status: DC | PRN

## 2014-08-13 MED ORDER — MUPIROCIN 2 % EX OINT
1.0000 "application " | TOPICAL_OINTMENT | Freq: Two times a day (BID) | CUTANEOUS | Status: DC
  Administered 2014-08-13 – 2014-08-16 (×6): 1 via NASAL
  Filled 2014-08-13: qty 22

## 2014-08-13 MED ORDER — MIDAZOLAM HCL 5 MG/5ML IJ SOLN
INTRAMUSCULAR | Status: DC | PRN
  Administered 2014-08-13: 1 mg via INTRAVENOUS

## 2014-08-13 MED ORDER — ATENOLOL 25 MG PO TABS
25.0000 mg | ORAL_TABLET | Freq: Every day | ORAL | Status: DC
  Administered 2014-08-14 – 2014-08-16 (×3): 25 mg via ORAL
  Filled 2014-08-13 (×4): qty 1

## 2014-08-13 MED ORDER — CHLORHEXIDINE GLUCONATE CLOTH 2 % EX PADS
6.0000 | MEDICATED_PAD | Freq: Every day | CUTANEOUS | Status: DC
  Administered 2014-08-14 – 2014-08-15 (×2): 6 via TOPICAL

## 2014-08-13 MED ORDER — CHLORHEXIDINE GLUCONATE 4 % EX LIQD
60.0000 mL | Freq: Once | CUTANEOUS | Status: DC
  Filled 2014-08-13: qty 60

## 2014-08-13 MED ORDER — ONDANSETRON HCL 4 MG/2ML IJ SOLN
INTRAMUSCULAR | Status: AC
  Filled 2014-08-13: qty 4

## 2014-08-13 MED ORDER — MORPHINE SULFATE 4 MG/ML IJ SOLN
INTRAMUSCULAR | Status: AC
  Filled 2014-08-13: qty 2

## 2014-08-13 MED ORDER — ONDANSETRON HCL 4 MG PO TABS: 4.0000 mg | ORAL_TABLET | Freq: Four times a day (QID) | ORAL | Status: DC | PRN

## 2014-08-13 MED ORDER — INSULIN ASPART 100 UNIT/ML ~~LOC~~ SOLN
0.0000 [IU] | Freq: Three times a day (TID) | SUBCUTANEOUS | Status: DC
Start: 2014-08-13 — End: 2014-08-16
  Administered 2014-08-13: 3 [IU] via SUBCUTANEOUS
  Administered 2014-08-14 (×2): 5 [IU] via SUBCUTANEOUS
  Administered 2014-08-14 – 2014-08-16 (×5): 3 [IU] via SUBCUTANEOUS
  Administered 2014-08-16: 2 [IU] via SUBCUTANEOUS

## 2014-08-13 MED ORDER — PHENYLEPHRINE 40 MCG/ML (10ML) SYRINGE FOR IV PUSH (FOR BLOOD PRESSURE SUPPORT)
PREFILLED_SYRINGE | INTRAVENOUS | Status: AC
  Filled 2014-08-13: qty 10

## 2014-08-13 MED ORDER — TRANEXAMIC ACID 100 MG/ML IV SOLN
1000.0000 mg | INTRAVENOUS | Status: AC
  Administered 2014-08-13: 1000 mg via INTRAVENOUS
  Filled 2014-08-13: qty 10

## 2014-08-13 MED ORDER — EXENATIDE 10 MCG/0.04ML ~~LOC~~ SOPN
10.0000 ug | PEN_INJECTOR | Freq: Two times a day (BID) | SUBCUTANEOUS | Status: DC
  Administered 2014-08-16: 10 ug via SUBCUTANEOUS

## 2014-08-13 MED ORDER — GLYCOPYRROLATE 0.2 MG/ML IJ SOLN
INTRAMUSCULAR | Status: DC | PRN
  Administered 2014-08-13: .15 mg via INTRAVENOUS

## 2014-08-13 MED ORDER — ACETAMINOPHEN 325 MG PO TABS: 650.0000 mg | ORAL_TABLET | Freq: Four times a day (QID) | ORAL | Status: DC | PRN

## 2014-08-13 MED ORDER — PROPOFOL 10 MG/ML IV BOLUS
INTRAVENOUS | Status: DC | PRN
  Administered 2014-08-13: 20 mg via INTRAVENOUS
  Administered 2014-08-13: 60 mg via INTRAVENOUS
  Administered 2014-08-13: 20 mg via INTRAVENOUS
  Administered 2014-08-13: 200 mg via INTRAVENOUS
  Administered 2014-08-13: 20 mg via INTRAVENOUS

## 2014-08-13 MED ORDER — PROPOFOL 10 MG/ML IV BOLUS
INTRAVENOUS | Status: AC
  Filled 2014-08-13: qty 20

## 2014-08-13 MED ORDER — LISINOPRIL-HYDROCHLOROTHIAZIDE 20-25 MG PO TABS: 2.0000 | ORAL_TABLET | Freq: Every day | ORAL | Status: DC

## 2014-08-13 MED ORDER — ONDANSETRON HCL 4 MG/2ML IJ SOLN: 4.0000 mg | Freq: Once | INTRAMUSCULAR | Status: DC | PRN

## 2014-08-13 MED ORDER — WARFARIN - PHARMACIST DOSING INPATIENT: Freq: Every day | Status: DC

## 2014-08-13 MED ORDER — GLIPIZIDE-METFORMIN HCL 5-500 MG PO TABS: 1.0000 | ORAL_TABLET | Freq: Every evening | ORAL | Status: DC

## 2014-08-13 MED ORDER — PNEUMOCOCCAL VAC POLYVALENT 25 MCG/0.5ML IJ INJ
0.5000 mL | INJECTION | INTRAMUSCULAR | Status: AC
  Administered 2014-08-15: 0.5 mL via INTRAMUSCULAR
  Filled 2014-08-13: qty 0.5

## 2014-08-13 MED ORDER — VANCOMYCIN HCL IN DEXTROSE 1-5 GM/200ML-% IV SOLN
1000.0000 mg | Freq: Two times a day (BID) | INTRAVENOUS | Status: AC
  Administered 2014-08-13: 1000 mg via INTRAVENOUS
  Filled 2014-08-13: qty 200

## 2014-08-13 MED ORDER — COUMADIN BOOK
Freq: Once | Status: AC
  Administered 2014-08-13: 17:00:00
  Filled 2014-08-13: qty 1

## 2014-08-13 MED ORDER — LIDOCAINE HCL (CARDIAC) 20 MG/ML IV SOLN
INTRAVENOUS | Status: DC | PRN
  Administered 2014-08-13: 100 mg via INTRAVENOUS

## 2014-08-13 MED ORDER — POTASSIUM CHLORIDE IN NACL 20-0.9 MEQ/L-% IV SOLN
INTRAVENOUS | Status: AC
  Administered 2014-08-13: 22:00:00 via INTRAVENOUS
  Filled 2014-08-13 (×2): qty 1000

## 2014-08-13 MED ORDER — GLYCOPYRROLATE 0.2 MG/ML IJ SOLN
INTRAMUSCULAR | Status: AC
  Filled 2014-08-13: qty 1

## 2014-08-13 MED ORDER — ONDANSETRON HCL 4 MG/2ML IJ SOLN: 4.0000 mg | Freq: Four times a day (QID) | INTRAMUSCULAR | Status: DC | PRN

## 2014-08-13 MED ORDER — GLIPIZIDE 5 MG PO TABS
5.0000 mg | ORAL_TABLET | Freq: Every day | ORAL | Status: DC
  Administered 2014-08-13 – 2014-08-15 (×3): 5 mg via ORAL
  Filled 2014-08-13 (×4): qty 1

## 2014-08-13 MED ORDER — LISINOPRIL 20 MG PO TABS
20.0000 mg | ORAL_TABLET | Freq: Every day | ORAL | Status: DC
  Administered 2014-08-13 – 2014-08-16 (×4): 20 mg via ORAL
  Filled 2014-08-13 (×4): qty 1

## 2014-08-13 MED ORDER — HYDROMORPHONE HCL PF 1 MG/ML IJ SOLN
1.0000 mg | INTRAMUSCULAR | Status: DC | PRN
  Administered 2014-08-15 – 2014-08-16 (×4): 1 mg via INTRAVENOUS
  Filled 2014-08-13 (×4): qty 1

## 2014-08-13 MED ORDER — WARFARIN SODIUM 7.5 MG PO TABS
7.5000 mg | ORAL_TABLET | Freq: Once | ORAL | Status: AC
  Administered 2014-08-13: 7.5 mg via ORAL
  Filled 2014-08-13: qty 1

## 2014-08-13 MED ORDER — PHENOL 1.4 % MT LIQD: 1.0000 | OROMUCOSAL | Status: DC | PRN

## 2014-08-13 MED ORDER — HYDROMORPHONE HCL PF 1 MG/ML IJ SOLN
INTRAMUSCULAR | Status: AC
  Filled 2014-08-13: qty 1

## 2014-08-13 MED ORDER — METFORMIN HCL 500 MG PO TABS
500.0000 mg | ORAL_TABLET | Freq: Every day | ORAL | Status: DC
  Administered 2014-08-13: 500 mg via ORAL
  Filled 2014-08-13 (×2): qty 1

## 2014-08-13 MED ORDER — ACETAMINOPHEN 650 MG RE SUPP: 650.0000 mg | Freq: Four times a day (QID) | RECTAL | Status: DC | PRN

## 2014-08-13 MED ORDER — VANCOMYCIN HCL IN DEXTROSE 1-5 GM/200ML-% IV SOLN
INTRAVENOUS | Status: AC
  Administered 2014-08-13: 1000 mg via INTRAVENOUS
  Filled 2014-08-13: qty 200

## 2014-08-13 MED ORDER — METFORMIN HCL 500 MG PO TABS
500.0000 mg | ORAL_TABLET | Freq: Every day | ORAL | Status: DC
  Administered 2014-08-13 – 2014-08-15 (×3): 500 mg via ORAL
  Filled 2014-08-13 (×4): qty 1

## 2014-08-13 MED ORDER — CLONIDINE HCL (ANALGESIA) 100 MCG/ML EP SOLN
150.0000 ug | Freq: Once | EPIDURAL | Status: DC
  Filled 2014-08-13: qty 1.5

## 2014-08-13 MED ORDER — DOCUSATE SODIUM 100 MG PO CAPS
100.0000 mg | ORAL_CAPSULE | Freq: Two times a day (BID) | ORAL | Status: DC
  Administered 2014-08-14 – 2014-08-16 (×5): 100 mg via ORAL
  Filled 2014-08-13 (×6): qty 1

## 2014-08-13 MED ORDER — PHENYLEPHRINE HCL 10 MG/ML IJ SOLN
INTRAMUSCULAR | Status: DC | PRN
  Administered 2014-08-13 (×3): 80 ug via INTRAVENOUS
  Administered 2014-08-13: 40 ug via INTRAVENOUS
  Administered 2014-08-13: 80 ug via INTRAVENOUS
  Administered 2014-08-13 (×2): 40 ug via INTRAVENOUS
  Administered 2014-08-13 (×3): 80 ug via INTRAVENOUS
  Administered 2014-08-13: 120 ug via INTRAVENOUS
  Administered 2014-08-13: 80 ug via INTRAVENOUS

## 2014-08-13 MED ORDER — MENTHOL 3 MG MT LOZG: 1.0000 | LOZENGE | OROMUCOSAL | Status: DC | PRN

## 2014-08-13 MED ORDER — PHENTERMINE HCL 37.5 MG PO CAPS: 37.5000 mg | ORAL_CAPSULE | ORAL | Status: DC

## 2014-08-13 MED ORDER — FENTANYL CITRATE 0.05 MG/ML IJ SOLN
INTRAMUSCULAR | Status: DC | PRN
  Administered 2014-08-13 (×2): 50 ug via INTRAVENOUS
  Administered 2014-08-13: 25 ug via INTRAVENOUS
  Administered 2014-08-13: 50 ug via INTRAVENOUS
  Administered 2014-08-13: 25 ug via INTRAVENOUS

## 2014-08-13 MED ORDER — BUPIVACAINE HCL (PF) 0.25 % IJ SOLN
INTRAMUSCULAR | Status: DC | PRN
  Administered 2014-08-13: 10 mL

## 2014-08-13 MED ORDER — METOCLOPRAMIDE HCL 10 MG PO TABS: 5.0000 mg | ORAL_TABLET | Freq: Three times a day (TID) | ORAL | Status: DC | PRN

## 2014-08-13 MED ORDER — GLIPIZIDE 5 MG PO TABS
5.0000 mg | ORAL_TABLET | Freq: Every day | ORAL | Status: DC
  Administered 2014-08-13: 5 mg via ORAL
  Filled 2014-08-13 (×2): qty 1

## 2014-08-13 MED ORDER — 0.9 % SODIUM CHLORIDE (POUR BTL) OPTIME
TOPICAL | Status: DC | PRN
  Administered 2014-08-13 (×5): 1000 mL

## 2014-08-13 MED ORDER — SODIUM CHLORIDE 0.9 % IV SOLN
INTRAVENOUS | Status: DC | PRN
  Administered 2014-08-13: 10:00:00 via INTRAVENOUS

## 2014-08-13 MED ORDER — SODIUM CHLORIDE 0.9 % IV SOLN
INTRAVENOUS | Status: DC | PRN
  Administered 2014-08-13: 09:00:00

## 2014-08-13 MED ORDER — LIDOCAINE HCL (CARDIAC) 20 MG/ML IV SOLN
INTRAVENOUS | Status: AC
  Filled 2014-08-13: qty 5

## 2014-08-13 MED ORDER — HYDROCHLOROTHIAZIDE 25 MG PO TABS
25.0000 mg | ORAL_TABLET | Freq: Every day | ORAL | Status: DC
  Administered 2014-08-13 – 2014-08-16 (×4): 25 mg via ORAL
  Filled 2014-08-13 (×4): qty 1

## 2014-08-13 MED ORDER — LACTATED RINGERS IV SOLN
INTRAVENOUS | Status: DC | PRN
  Administered 2014-08-13 (×2): via INTRAVENOUS

## 2014-08-13 MED ORDER — HYDROCODONE-ACETAMINOPHEN 10-325 MG PO TABS
1.0000 | ORAL_TABLET | ORAL | Status: DC | PRN
  Administered 2014-08-13: 2 via ORAL
  Administered 2014-08-14: 1 via ORAL
  Administered 2014-08-14 – 2014-08-15 (×6): 2 via ORAL
  Filled 2014-08-13 (×8): qty 2

## 2014-08-13 MED ORDER — PHENYLEPHRINE 40 MCG/ML (10ML) SYRINGE FOR IV PUSH (FOR BLOOD PRESSURE SUPPORT)
PREFILLED_SYRINGE | INTRAVENOUS | Status: AC
  Filled 2014-08-13: qty 20

## 2014-08-13 MED ORDER — METOCLOPRAMIDE HCL 5 MG/ML IJ SOLN: 5.0000 mg | Freq: Three times a day (TID) | INTRAMUSCULAR | Status: DC | PRN

## 2014-08-13 MED ORDER — CLONIDINE HCL (ANALGESIA) 100 MCG/ML EP SOLN
EPIDURAL | Status: DC | PRN
  Administered 2014-08-13: 1 mL

## 2014-08-13 SURGICAL SUPPLY — 74 items
BANDAGE ELASTIC 4 VELCRO ST LF (GAUZE/BANDAGES/DRESSINGS) ×3 IMPLANT
BANDAGE ELASTIC 6 VELCRO ST LF (GAUZE/BANDAGES/DRESSINGS) ×3 IMPLANT
BANDAGE ESMARK 6X9 LF (GAUZE/BANDAGES/DRESSINGS) ×1 IMPLANT
BLADE SAG 18X100X1.27 (BLADE) ×3 IMPLANT
BLADE SAW SGTL 13.0X1.19X90.0M (BLADE) ×3 IMPLANT
BNDG COHESIVE 6X5 TAN STRL LF (GAUZE/BANDAGES/DRESSINGS) ×3 IMPLANT
BNDG ELASTIC 6X10 VLCR STRL LF (GAUZE/BANDAGES/DRESSINGS) ×9 IMPLANT
BNDG ESMARK 6X9 LF (GAUZE/BANDAGES/DRESSINGS) ×3
BONE CEMENT PALACOSE (Orthopedic Implant) ×6 IMPLANT
BOWL SMART MIX CTS (DISPOSABLE) ×3 IMPLANT
CEMENT BONE PALACOSE (Orthopedic Implant) ×2 IMPLANT
COVER SURGICAL LIGHT HANDLE (MISCELLANEOUS) ×3 IMPLANT
CUFF TOURNIQUET SINGLE 34IN LL (TOURNIQUET CUFF) ×3 IMPLANT
CUFF TOURNIQUET SINGLE 44IN (TOURNIQUET CUFF) IMPLANT
DRAPE INCISE IOBAN 66X45 STRL (DRAPES) IMPLANT
DRAPE ORTHO SPLIT 77X108 STRL (DRAPES) ×6
DRAPE SURG ORHT 6 SPLT 77X108 (DRAPES) ×3 IMPLANT
DRAPE U-SHAPE 47X51 STRL (DRAPES) ×3 IMPLANT
DRSG PAD ABDOMINAL 8X10 ST (GAUZE/BANDAGES/DRESSINGS) ×3 IMPLANT
DURAPREP 26ML APPLICATOR (WOUND CARE) ×3 IMPLANT
ELECT REM PT RETURN 9FT ADLT (ELECTROSURGICAL) ×3
ELECTRODE REM PT RTRN 9FT ADLT (ELECTROSURGICAL) ×1 IMPLANT
FACESHIELD WRAPAROUND (MASK) ×3 IMPLANT
GAUZE SPONGE 4X4 12PLY STRL (GAUZE/BANDAGES/DRESSINGS) ×3 IMPLANT
GAUZE XEROFORM 5X9 LF (GAUZE/BANDAGES/DRESSINGS) ×3 IMPLANT
GLOVE BIOGEL PI IND STRL 7.5 (GLOVE) ×1 IMPLANT
GLOVE BIOGEL PI IND STRL 8 (GLOVE) ×1 IMPLANT
GLOVE BIOGEL PI INDICATOR 7.5 (GLOVE) ×2
GLOVE BIOGEL PI INDICATOR 8 (GLOVE) ×2
GLOVE ECLIPSE 7.0 STRL STRAW (GLOVE) ×6 IMPLANT
GLOVE SURG ORTHO 8.0 STRL STRW (GLOVE) ×3 IMPLANT
GOWN STRL REUS W/ TWL LRG LVL3 (GOWN DISPOSABLE) ×3 IMPLANT
GOWN STRL REUS W/ TWL XL LVL3 (GOWN DISPOSABLE) ×1 IMPLANT
GOWN STRL REUS W/TWL LRG LVL3 (GOWN DISPOSABLE) ×6
GOWN STRL REUS W/TWL XL LVL3 (GOWN DISPOSABLE) ×2
HANDPIECE INTERPULSE COAX TIP (DISPOSABLE) ×2
HOOD PEEL AWAY FACE SHEILD DIS (HOOD) ×12 IMPLANT
IMMOBILIZER KNEE 20 (SOFTGOODS) IMPLANT
IMMOBILIZER KNEE 22 UNIV (SOFTGOODS) IMPLANT
IMMOBILIZER KNEE 24 THIGH 36 (MISCELLANEOUS) ×1 IMPLANT
IMMOBILIZER KNEE 24 UNIV (MISCELLANEOUS) ×3
KIT BASIN OR (CUSTOM PROCEDURE TRAY) ×3 IMPLANT
KIT ROOM TURNOVER OR (KITS) ×3 IMPLANT
KNEE/VIT E POLY LINER LEVEL 1B ×3 IMPLANT
MANIFOLD NEPTUNE II (INSTRUMENTS) ×3 IMPLANT
NEEDLE 18GX1X1/2 (RX/OR ONLY) (NEEDLE) ×3 IMPLANT
NEEDLE 21X1 OR PACK (NEEDLE) ×3 IMPLANT
NEEDLE HYPO 22GX1.5 SAFETY (NEEDLE) ×3 IMPLANT
NEEDLE SPNL 18GX3.5 QUINCKE PK (NEEDLE) ×3 IMPLANT
NS IRRIG 1000ML POUR BTL (IV SOLUTION) ×6 IMPLANT
PACK TOTAL JOINT (CUSTOM PROCEDURE TRAY) ×3 IMPLANT
PAD ARMBOARD 7.5X6 YLW CONV (MISCELLANEOUS) ×6 IMPLANT
PAD CAST 4YDX4 CTTN HI CHSV (CAST SUPPLIES) ×1 IMPLANT
PADDING CAST COTTON 4X4 STRL (CAST SUPPLIES) ×2
PADDING CAST COTTON 6X4 STRL (CAST SUPPLIES) ×6 IMPLANT
RUBBERBAND STERILE (MISCELLANEOUS) IMPLANT
SET HNDPC FAN SPRY TIP SCT (DISPOSABLE) ×1 IMPLANT
SPONGE GAUZE 4X4 12PLY STER LF (GAUZE/BANDAGES/DRESSINGS) ×3 IMPLANT
SPONGE LAP 18X18 X RAY DECT (DISPOSABLE) IMPLANT
STAPLER VISISTAT 35W (STAPLE) ×3 IMPLANT
SUCTION FRAZIER TIP 10 FR DISP (SUCTIONS) ×3 IMPLANT
SUT VIC AB 0 CTB1 27 (SUTURE) ×9 IMPLANT
SUT VIC AB 1 CT1 27 (SUTURE) ×10
SUT VIC AB 1 CT1 27XBRD ANBCTR (SUTURE) ×5 IMPLANT
SUT VIC AB 2-0 CT1 27 (SUTURE) ×4
SUT VIC AB 2-0 CT1 TAPERPNT 27 (SUTURE) ×2 IMPLANT
SYR 30ML LL (SYRINGE) ×6 IMPLANT
SYR 30ML SLIP (SYRINGE) ×3 IMPLANT
SYR 50ML LL SCALE MARK (SYRINGE) ×2 IMPLANT
SYR TB 1ML LUER SLIP (SYRINGE) ×3 IMPLANT
TOWEL OR 17X24 6PK STRL BLUE (TOWEL DISPOSABLE) ×6 IMPLANT
TOWEL OR 17X26 10 PK STRL BLUE (TOWEL DISPOSABLE) ×6 IMPLANT
TRAY FOLEY CATH 16FRSI W/METER (SET/KITS/TRAYS/PACK) ×3 IMPLANT
WATER STERILE IRR 1000ML POUR (IV SOLUTION) ×6 IMPLANT

## 2014-08-13 NOTE — Progress Notes (Signed)
Utilization review completed.  

## 2014-08-13 NOTE — Plan of Care (Signed)
Problem: Consults Goal: Diagnosis- Total Joint Replacement Primary Total Knee Left     

## 2014-08-13 NOTE — Anesthesia Postprocedure Evaluation (Signed)
  Anesthesia Post-op Note  Patient: Personal assistant  Procedure(s) Performed: Procedure(s): TOTAL KNEE ARTHROPLASTY (Left)  Patient Location: PACU  Anesthesia Type:GA combined with regional for post-op pain  Level of Consciousness: awake, alert , oriented and patient cooperative  Airway and Oxygen Therapy: Patient Spontanous Breathing  Post-op Pain: mild  Post-op Assessment: Post-op Vital signs reviewed, Patient's Cardiovascular Status Stable, Respiratory Function Stable, Patent Airway and No signs of Nausea or vomiting  Post-op Vital Signs: stable  Last Vitals:  Filed Vitals:   08/13/14 1043  BP: 119/58  Pulse: 91  Temp: 36.2 C  Resp: 15    Complications: No apparent anesthesia complications

## 2014-08-13 NOTE — Brief Op Note (Signed)
08/13/2014  10:59 AM  PATIENT:  Kaitlyn Castillo  53 y.o. female  PRE-OPERATIVE DIAGNOSIS:  LEFT KNEE OSTEOARTHRITIS  POST-OPERATIVE DIAGNOSIS:  LEFT KNEE OSTEOARTHRITIS  PROCEDURE:  Procedure(s): TOTAL KNEE ARTHROPLASTY  [place]ault:  Cabell(s): Meredith Pel, MD  ASSISTANT: s vernonpa  ANESTHESIA:   general  EBL: 50 ml    Total I/O In: 2200 [I.V.:2200] Out: 325 [Urine:225; Blood:100]  BLOOD ADMINISTERED: none  DRAINS: none   LOCAL MEDICATIONS USED: exparel - marcaine  SPECIMEN:  No Specimen  COUNTS:  YES  TOURNIQUET:  * Missing tourniquet times found for documented tourniquets in log:  [id number] *  DICTATION: .Other Dictation: Dictation Number [id number]  PLAN OF CARE: Admit to inpatient   PATIENT DISPOSITION:  PACU - hemodynamically stable

## 2014-08-13 NOTE — Progress Notes (Signed)
ANTICOAGULATION CONSULT NOTE - Initial Consult  Pharmacy Consult for Coumadin  Indication: VTE prophylaxis s/p L TKA   Allergies  Allergen Reactions  . Aleve [Naproxen Sodium] Hives  . Iodine Hives    Certain iodine in fish  . Iohexol      Code: HIVES, Desc: REPORTED HIVES, PT STATES SHE DOES FINE IF SHE HAS A BENADRYL FIRST, ARS 02/09/09   . Percocet [Oxycodone-Acetaminophen] Hives and Itching    Paronoid    Patient Measurements: Height: 5\' 4"  (162.6 cm) Weight: 268 lb (121.564 kg) IBW/kg (Calculated) : 54.7  Vital Signs: Temp: 98.4 F (36.9 C) (09/01 1356) Temp src: Oral (09/01 1356) BP: 130/64 mmHg (09/01 1356) Pulse Rate: 96 (09/01 1356)  Labs: No results found for this basename: HGB, HCT, PLT, APTT, LABPROT, INR, HEPARINUNFRC, CREATININE, CKTOTAL, CKMB, TROPONINI,  in the last 72 hours  Estimated Creatinine Clearance: 86.3 ml/min (by C-G formula based on Cr of 0.97).   Medical History: Past Medical History  Diagnosis Date  . Knee pain, right   . Diabetes mellitus   . Hypertension   . Obese   . High cholesterol   . Head injury, closed, with brief LOC   . Shortness of breath     with too much fluid- not currently  . Constipation   . Arthritis     Medications:  Prescriptions prior to admission  Medication Sig Dispense Refill  . acetaminophen-codeine (TYLENOL #3) 300-30 MG per tablet Take 1-2 tablets by mouth every 6 (six) hours as needed for pain.  15 tablet  0  . atenolol (TENORMIN) 25 MG tablet Take 25 mg by mouth daily.      . diclofenac sodium (VOLTAREN) 1 % GEL Apply 2 g topically 4 (four) times daily as needed (pain).      Marland Kitchen exenatide (BYETTA 10 MCG PEN) 10 MCG/0.04ML SOPN injection Inject 10 mcg into the skin 2 (two) times daily with a meal.      . glipiZIDE-metformin (METAGLIP) 5-500 MG per tablet Take 1 tablet by mouth every evening.       Marland Kitchen lisinopril-hydrochlorothiazide (PRINZIDE,ZESTORETIC) 20-25 MG per tablet Take 2 tablets by mouth daily.        . phentermine 37.5 MG capsule Take 37.5 mg by mouth every morning.      . psyllium (METAMUCIL) 58.6 % powder Take 1 packet by mouth daily.        Assessment: 9 YOF with left knee osteoarthritis who is s/p L TKA. Pharmacy consulted to start Coumadin for VTE prophylaxis. BL INR 1.04. H/H and Plt wnl. Coumadin points = 6   Goal of Therapy:  INR 2-3 Monitor platelets by anticoagulation protocol: Yes   Plan:  -Coumadin 7.5 mg x 1 dose  -Daily PT/INR -Start Coumadin education  Albertina Parr, PharmD.  Clinical Pharmacist Pager [phone]

## 2014-08-13 NOTE — H&P (Signed)
TOTAL KNEE ADMISSION H&P  Patient is being admitted for left total knee arthroplasty.  Subjective:  Chief Complaint:left knee pain.  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  Onset of symptoms was gradual, starting 6 years ago with gradually worsening course since that time. The patient noted no past surgery on the left knee(s).  Patient currently rates pain in the left knee(s) at 10 out of 10 with activity. Patient has night pain, worsening of pain with activity and weight bearing, pain that interferes with activities of daily living, pain with passive range of motion, crepitus and joint swelling.  Patient has evidence of subchondral sclerosis and joint space narrowing by imaging studies. This patient has had 2 surgeries on the right knee without infection or complication. There is no active infection.  Patient Active Problem List   Diagnosis Date Noted  . Pain in lower limb 04/24/2014  . Onychomycosis 11/02/2013  . Pain, foot 11/02/2013  . Ulcer of other part of foot 11/02/2013  . Unspecified venous (peripheral) insufficiency 09/13/2012  . Cellulitis of left leg 07/07/2012  . ARF (acute renal failure) 07/07/2012  . LEG PAIN, LEFT 01/10/2008  . ANXIETY 11/19/2007  . DIABETIC FOOT ULCER, TOE 11/17/2007  . CHEST PAIN UNSPECIFIED 11/01/2007  . DIABETES MELLITUS, TYPE II 07/29/2007  . DEPRESSION 07/29/2007  . HYPERTENSION 07/29/2007   Past Medical History  Diagnosis Date  . Knee pain, right   . Diabetes mellitus   . Hypertension   . Obese   . High cholesterol   . Head injury, closed, with brief LOC   . Shortness of breath     with too much fluid- not currently  . Constipation   . Arthritis      Past Surgical History  Procedure Laterality Date  . Knee surgery      quad  . Quadriceps repair    . Cesarean section    . Colonoscopy    . Carbuncle      back of head    Prescriptions prior to admission  Medication Sig Dispense Refill  . acetaminophen-codeine (TYLENOL #3) 300-30 MG per tablet Take 1-2 tablets by mouth every 6 (six) hours as needed for pain.  15 tablet  0  . atenolol (TENORMIN) 25 MG tablet Take 25 mg by mouth daily.      . diclofenac sodium (VOLTAREN) 1 % GEL Apply 2 g topically 4 (four) times daily as needed (pain).      Marland Kitchen exenatide (BYETTA 10 MCG PEN) 10 MCG/0.04ML SOPN injection Inject 10 mcg into the skin 2 (two) times daily with a meal.      . glipiZIDE-metformin (METAGLIP) 5-500 MG per tablet Take 1 tablet by mouth every evening.       Marland Kitchen lisinopril-hydrochlorothiazide (PRINZIDE,ZESTORETIC) 20-25 MG per tablet Take 2 tablets by mouth daily.       . phentermine 37.5 MG capsule Take 37.5 mg by mouth every morning.      . psyllium (METAMUCIL) 58.6 % powder Take 1 packet by mouth daily.       Allergies  Allergen Reactions  . Aleve [Naproxen Sodium] Hives  . Iodine Hives    Certain iodine in fish  . Iohexol      Code: HIVES, Desc: REPORTED HIVES, PT STATES  SHE DOES FINE IF SHE HAS A BENADRYL FIRST, ARS 02/09/09   . Percocet [Oxycodone-Acetaminophen] Hives and Itching    Paronoid    History  Substance Use Topics  . Smoking status: Former Smoker -- 0 years    Types: Cigarettes    Quit date: 12/13/1978  . Smokeless tobacco: Never Used  . Alcohol Use: No    Family History  Problem Relation Age of Onset  . Diabetes Mother   . Hyperlipidemia Mother   . Hypertension Mother   . Diabetes Father   . Hyperlipidemia Father   . Hypertension Father   . Cancer Brother   . Heart disease Brother   . Hypertension Brother   . Heart attack Brother      Review of Systems  Constitutional: Negative.   HENT: Negative.   Eyes: Negative.   Respiratory: Negative.    Cardiovascular: Negative.   Gastrointestinal: Negative.   Genitourinary: Negative.   Musculoskeletal: Positive for joint pain.  Skin: Negative.   Neurological: Negative.   Endo/Heme/Allergies: Negative.   Psychiatric/Behavioral: Negative.     Objective:  Physical Exam  Constitutional: She appears well-developed.  HENT:  Head: Normocephalic.  Eyes: Pupils are equal, round, and reactive to light.  Neck: Normal range of motion.  Cardiovascular: Normal rate.   Respiratory: Effort normal.  Neurological: She is alert.  Skin: Skin is warm.  Psychiatric: She has a normal mood and affect.   examination of the right knee demonstrates palpable pedal pulses skin is intact in the anterior portion of the knee patella is stable extensor mechanism is intact range of motion is 0-110 collaterals are stable patellofemoral crepitus is present  Vital signs in last 24 hours: Temp:  [98.5 F (36.9 C)] 98.5 F (36.9 C) (09/01 0642) Pulse Rate:  [73] 73 (09/01 0642) Resp:  [20] 20 (09/01 0642) BP: (91)/(59) 91/59 mmHg (09/01 0642) SpO2:  [100 %] 100 % (09/01 0642) Weight:  [121.564 kg (268 lb)] 121.564 kg (268 lb) (09/01 0642)  Labs:   Estimated body mass index is 45.98 kg/(m^2) as calculated from the following:   Height as of this encounter: 5\' 4"  (1.626 m).   Weight as of this encounter: 121.564 kg (268 lb).   Imaging Review Plain radiographs demonstrate severe degenerative joint disease of the left knee(s). The overall alignment isneutral. The bone quality appears to be good for age and reported activity level.  Assessment/Plan:  End stage arthritis, left knee   The patient history, physical examination, clinical judgment of the Aloysious Vangieson and imaging studies are consistent with end stage degenerative joint disease of the left knee(s) and total knee arthroplasty is deemed medically necessary. The treatment options including medical management, injection therapy arthroscopy and  arthroplasty were discussed at length. The risks and benefits of total knee arthroplasty were presented and reviewed. The risks due to aseptic loosening, infection, stiffness, patella tracking problems, thromboembolic complications and other imponderables were discussed. The patient acknowledged the explanation, agreed to proceed with the plan and consent was signed. Patient is being admitted for inpatient treatment for surgery, pain control, PT, OT, prophylactic antibiotics, VTE prophylaxis, progressive ambulation and ADL's and discharge planning. The patient is planning to be discharged home with home health services preoperatively the patient did test positive for MRSA colonization. She also had some staph in her urine. She was treated with antibiotics preoperatively as well as Bactroban cream in the nose preoperatively. We will plan for both Ancef and vancomycin to be given preoperatively and postoperatively to decrease  the chance of infection. No family history of DVT or pulmonary embolism.

## 2014-08-13 NOTE — Anesthesia Preprocedure Evaluation (Addendum)
Anesthesia Evaluation  Patient identified by MRN, date of birth, ID band Patient awake    Reviewed: Allergy & Precautions, H&P , NPO status , Patient's Chart, lab work & pertinent test results, reviewed documented beta blocker date and time   Airway Mallampati: II TM Distance: >3 FB Neck ROM: Full    Dental  (+) Partial Upper, Partial Lower, Dental Advisory Given   Pulmonary former smoker,          Cardiovascular hypertension, Pt. on medications + Peripheral Vascular Disease     Neuro/Psych    GI/Hepatic   Endo/Other  diabetes, Type 2, Oral Hypoglycemic AgentsMorbid obesity  Renal/GU Renal disease     Musculoskeletal  (+) Arthritis -,   Abdominal   Peds  Hematology   Anesthesia Other Findings   Reproductive/Obstetrics                          Anesthesia Physical Anesthesia Plan  ASA: III  Anesthesia Plan: General   Post-op Pain Management:    Induction: Intravenous  Airway Management Planned: LMA and Oral ETT  Additional Equipment:   Intra-op Plan:   Post-operative Plan: Extubation in OR  Informed Consent: I have reviewed the patients History and Physical, chart, labs and discussed the procedure including the risks, benefits and alternatives for the proposed anesthesia with the patient or authorized representative who has indicated his/her understanding and acceptance.     Plan Discussed with: CRNA, Anesthesiologist and Smock  Anesthesia Plan Comments:         Anesthesia Quick Evaluation

## 2014-08-13 NOTE — Anesthesia Procedure Notes (Addendum)
Anesthesia Regional Block:  Femoral nerve block  Pre-Anesthetic Checklist: ,, timeout performed, Correct Patient, Correct Site, Correct Laterality, Correct Procedure, Correct Position, site marked, Risks and benefits discussed,  Surgical consent,  Pre-op evaluation,  At Perea's request and post-op pain management  Laterality: Left  Prep: Maximum Sterile Barrier Precautions used, chloraprep and alcohol swabs       Needles:  Injection technique: Single-shot  Needle Type: Stimulator Needle - 80        Needle insertion depth: 7 cm   Additional Needles:  Procedures: nerve stimulator Femoral nerve block  Nerve Stimulator or Paresthesia:  Response: 0.5 mA, 0.1 ms, 7 cm  Additional Responses:   Narrative:  Start time: 08/13/2014 7:05 AM End time: 08/13/2014 7:10 AM Injection made incrementally with aspirations every 5 mL.  Performed by: Personally  Anesthesiologist: Sharolyn Douglas MD  Additional Notes: Pt accepts procedure w/ risks. 20cc 0.5% Marcaine w/ epi w/o difficulty or discomfort. GES   Procedure Name: LMA Insertion Date/Time: 08/13/2014 7:38 AM Performed by: Terrill Mohr Pre-anesthesia Checklist: Emergency Drugs available, Patient identified, Suction available and Patient being monitored Patient Re-evaluated:Patient Re-evaluated prior to inductionOxygen Delivery Method: Circle system utilized Preoxygenation: Pre-oxygenation with 100% oxygen Intubation Type: IV induction Ventilation: Mask ventilation without difficulty LMA: LMA inserted LMA Size: 4.0 Number of attempts: 1 Placement Confirmation: breath sounds checked- equal and bilateral and positive ETCO2 Tube secured with: Tape (taped across cheeks) Dental Injury: Teeth and Oropharynx as per pre-operative assessment

## 2014-08-13 NOTE — Op Note (Signed)
NAMEKATARINA, RIEBE NO.:  [id number]  MEDICAL RECORD NO.:  [id number]  LOCATION:  5N07C                        FACILITY:  [facility]  PHYSICIAN:  Anderson Malta, M.D.    DATE OF BIRTH:  1961-03-03  DATE OF PROCEDURE:  08/13/2014 DATE OF DISCHARGE:                              OPERATIVE REPORT   PREOPERATIVE DIAGNOSIS:  Left knee arthritis.  POSTOPERATIVE DIAGNOSIS:  Left knee arthritis.  PROCEDURE:  Left total knee replacement, [employer] Triathlon posterior cruciate retaining cemented knee, 5 femur, 5 tibia, 13 poly insert, 35 three-pegged cemented patella.  Teat:  Anderson Malta, M.D.  ASSISTANT:  Epimenio Foot, P.A.  ANESTHESIA:  General.  INDICATIONS:  Kaitlyn Castillo is a patient with end-stage left knee arthritis, refractory to nonoperative management, presents now for operative management after explanation of risks and benefits.  DESCRIPTION OF PROCEDURE:  The patient was brought to the operating room where general endotracheal anesthesia was induced.  Preoperative antibiotics administered.  Time-out was called.  Left leg was prescrubbed with alcohol and Betadine, let to air  dry, prepped with DuraPrep solution, draped in sterile manner.  Preoperative vancomycin and Ancef given.  Time-out called.  Left leg was then prepped with DuraPrep solution and draped in sterile manner.  Ioban used to cover the operative field.  Leg was elevated and exsanguinated the Esmarch wrap. Tourniquet inflated to 300 mmHg.  Total tourniquet time of about 145 minutes.  The patient did have a slight valgus alignment to begin with. Anterior approach to the knee was made after elevating and exsanguinated with the Esmarch wrap and inflating the tourniquet.  Skin and subcutaneous tissues were sharply divided.  Median parapatellar approach was made.  Minimal soft tissue dissection performed on the medial side because of her preop valgus deformity.  Fat pad partially  excised. Lateral patellofemoral ligament released.  Soft tissue elevated off the distal anterior femur.  Intramedullary alignment used to cut the tibia. Initial cut was made about 2 mm underneath the most affected lateral tibial plateau.  This required a re-cut in order to actually get below the defect.  A total of 10-mm cut was made on the tibia.  This is perpendicular to mechanical axis.  A 10-mm cut was then made on the distal femur.  Again, this was done in a stepwise fashion in conjunction with the tibial cuts in order to achieve full extension.  Did require a 10-mm resection off the distal femur.  With this, a full extension was achievable with a 9-mm spacer in position.  At this time, femur was prepared and sized to a size 5.  Osteophytes were removed posteriorly. The tibia was then keel punched.  Collateral posterior structures were protected during tibial and femoral preparation.  Flex rod was utilized. PCL was saved.  At this time, trial components were placed and the patient had some hyperextension with a 9-mm spacer and some valgus instability at 0 and 30 degrees.  This was increased to 11 mm and 13 mm spacer trial which improved it.  Instruments were removed.  Because of the patient's preop varus deformity, lateral release was required.  This did improve patellar tracking to require no-thumbs technique.  The patient  did have good patellar tracking using no-thumbs technique after lateral release.  Thorough irrigation performed in the knee, Exparel placed.  Components cemented into position.  Initially, used an 11-mm spacer, however, the 13-mm spacer gave full extension and good stability to varus valgus stress at 0, 30, and 90 degrees with good patellar tracking.  The tourniquet was released.  Bleeding points controlled with electrocautery.  Thorough irrigation again performed.  The patient's knee was then closed over a bolster using interrupted inverted #1 Vicryl suture, 0  Vicryl suture, 2-0 Vicryl suture, and skin staples.  Skin edges were anesthetized with Marcaine.  Solution of Marcaine and clonidine injected into the knee joint.  The patient tolerated the procedure well without immediate complication.  Bulky dressing and wrap were applied.  Freda Munro Vernon's assistance required all times during the case for retraction of neurovascular structures.  Her assistance was a medical necessity.  She was also utilized for opening, closing, and cement removal.     Anderson Malta, M.D.     GSD/MEDQ  D:  08/13/2014  T:  08/13/2014  Job:  [id number]

## 2014-08-13 NOTE — Transfer of Care (Signed)
Immediate Anesthesia Transfer of Care Note  Patient: Kaitlyn Castillo  Procedure(s) Performed: Procedure(s): TOTAL KNEE ARTHROPLASTY (Left)  Patient Location: PACU  Anesthesia Type:General  Level of Consciousness: awake and patient cooperative  Airway & Oxygen Therapy: Patient Spontanous Breathing and Patient connected to nasal cannula oxygen  Post-op Assessment: Report given to PACU RN, Post -op Vital signs reviewed and stable and Patient moving all extremities  Post vital signs: Reviewed and stable  Complications: No apparent anesthesia complications

## 2014-08-14 LAB — BASIC METABOLIC PANEL
ANION GAP: 7 (ref 5–15)
BUN: 22 mg/dL (ref 6–23)
CHLORIDE: 105 meq/L (ref 96–112)
CO2: 26 mEq/L (ref 19–32)
Calcium: 8 mg/dL — ABNORMAL LOW (ref 8.4–10.5)
Creatinine, Ser: 1.34 mg/dL — ABNORMAL HIGH (ref 0.50–1.10)
GFR calc Af Amer: 51 mL/min — ABNORMAL LOW (ref >90)
GFR calc non Af Amer: 44 mL/min — ABNORMAL LOW (ref >90)
Glucose, Bld: 162 mg/dL — ABNORMAL HIGH (ref 70–99)
POTASSIUM: 4.6 meq/L (ref 3.7–5.3)
Sodium: 138 mEq/L (ref 137–147)

## 2014-08-14 LAB — PROTIME-INR
INR: 1.22 (ref 0.00–1.49)
PROTHROMBIN TIME: 15.4 s — AB (ref 11.6–15.2)

## 2014-08-14 LAB — GLUCOSE, CAPILLARY
GLUCOSE-CAPILLARY: 144 mg/dL — AB (ref 70–99)
Glucose-Capillary: 159 mg/dL — ABNORMAL HIGH (ref 70–99)
Glucose-Capillary: 230 mg/dL — ABNORMAL HIGH (ref 70–99)
Glucose-Capillary: 204 mg/dL — ABNORMAL HIGH (ref 70–99)

## 2014-08-14 LAB — CBC
HCT: 30.5 % — ABNORMAL LOW (ref 36.0–46.0)
Hemoglobin: 9.9 g/dL — ABNORMAL LOW (ref 12.0–15.0)
MCH: 26.7 pg (ref 26.0–34.0)
MCHC: 32.5 g/dL (ref 30.0–36.0)
MCV: 82.2 fL (ref 78.0–100.0)
Platelets: 158 10*3/uL (ref 150–400)
RBC: 3.71 MIL/uL — ABNORMAL LOW (ref 3.87–5.11)
RDW: 15.2 % (ref 11.5–15.5)
WBC: 5.5 10*3/uL (ref 4.0–10.5)

## 2014-08-14 MED ORDER — WARFARIN SODIUM 7.5 MG PO TABS
7.5000 mg | ORAL_TABLET | Freq: Once | ORAL | Status: AC
  Administered 2014-08-14: 7.5 mg via ORAL
  Filled 2014-08-14: qty 1

## 2014-08-14 MED ORDER — CALCIUM CARBONATE 1250 (500 CA) MG PO TABS
1.0000 | ORAL_TABLET | Freq: Three times a day (TID) | ORAL | Status: AC
  Administered 2014-08-14 – 2014-08-15 (×5): 500 mg via ORAL
  Filled 2014-08-14 (×6): qty 1

## 2014-08-14 NOTE — Progress Notes (Signed)
Physical Therapy Treatment Patient Details Name: Kaitlyn Castillo MRN: [id number] DOB: May 23, 1961 Today's Date: 08/14/2014    History of Present Illness 52 y.o. female admitted to [facility] on 08/13/14 s/p elective L TKA.  Pt with significant PMHx of DM, HTN, closed head injury, and multiple right knee surgeries (R quad tendon repair).       PT Comments    Patient progressing nicely towards physical therapy goals, ambulating up to 95 feet today with supervision while using rolling walker. Able to ascend/descend 2 steps with stair training. Plan to progress gait and safety with stair navigation tomorrow (pt with 15 steps to enter bedroom). Patient will continue to benefit from skilled physical therapy services to further improve independence with functional mobility.   Follow Up Recommendations  Home health PT;Supervision/Assistance - 24 hour     Equipment Recommendations  Hospital bed    Recommendations for Other Services       Precautions / Restrictions Precautions Precautions: Fall;Knee Required Braces or Orthoses: Knee Immobilizer - Left Restrictions Weight Bearing Restrictions: Yes LLE Weight Bearing: Weight bearing as tolerated    Mobility  Bed Mobility Overal bed mobility: Needs Assistance Bed Mobility: Supine to Sit     Supine to sit: Min guard Sit to supine: Min assist   General bed mobility comments: Min guard for safety. VC for technique. Use of bed rail to rise to seated position.  Transfers Overall transfer level: Needs assistance Equipment used: Rolling walker (2 wheeled) Transfers: Sit to/from Stand Sit to Stand: Min guard         General transfer comment: Min guard for safety. Needed reminded twice for hand placement due to unsafely pushing on RW to stand from lowest bed setting. Able to perform on second attempt with hands in correct place without physical assist. Also performed from recliner and BSC with improved safety.  Ambulation/Gait Ambulation/Gait  assistance: Supervision Ambulation Distance (Feet): 95 Feet Assistive device: Rolling walker (2 wheeled) Gait Pattern/deviations: Step-to pattern;Step-through pattern;Decreased step length - right;Decreased stance time - left;Decreased weight shift to left;Antalgic;Trunk flexed   Gait velocity interpretation: Below normal speed for age/gender General Gait Details: Very slow and guarded gait pattern. VC for upright posture, forward gaze, and left knee extension in stance phase for quad activation.  No instances of knee buckling during bout. Slowly emerging step-through gait pattern.   Stairs Stairs: Yes Stairs assistance: Min guard Stair Management: One rail Left;Step to pattern;Sideways Number of Stairs: 2 General stair comments: Min guard for safety. Demonstrated to patient prior to having her practice. More difficulty descending steps but able to self support using rail. VC for technique.  Wheelchair Mobility    Modified Rankin (Stroke Patients Only)       Balance                                    Cognition Arousal/Alertness: Awake/alert Behavior During Therapy: WFL for tasks assessed/performed Overall Cognitive Status: Within Functional Limits for tasks assessed                      Exercises Total Joint Exercises Ankle Circles/Pumps: AROM;Both;10 reps;Supine Quad Sets: AROM;Left;15 reps;Supine    General Comments        Pertinent Vitals/Pain Pain Assessment: No/denies pain Pain Intervention(s): Monitored during session;Repositioned    Home Living Family/patient expects to be discharged to:: Private residence Living Arrangements: Children Available Help at Discharge: Family;Available PRN/intermittently Type  of Home: House Home Access: Stairs to enter Entrance Stairs-Rails: None Home Layout: Two level;1/2 bath on main level;Bed/bath upstairs Home Equipment: Walker - 2 wheels;Bedside commode;Shower seat (CPM)      Prior Function Level  of Independence: Independent          PT Goals (current goals can now be found in the care plan section) Acute Rehab PT Goals Patient Stated Goal: to go home when she is done in the hospital PT Goal Formulation: With patient Time For Goal Achievement: 08/21/14 Potential to Achieve Goals: Good Progress towards PT goals: Progressing toward goals    Frequency  7X/week    PT Plan Current plan remains appropriate    Co-evaluation             End of Session Equipment Utilized During Treatment: Left knee immobilizer Activity Tolerance: Patient tolerated treatment well Patient left: in chair;with call bell/phone within reach     Time: 1539-1619 PT Time Calculation (min): 40 min  Charges:  $Gait Training: 23-37 mins $Therapeutic Activity: 8-22 mins                    G Codes:      [employer], [place]  Kaitlyn Castillo 08/14/2014, 4:37 PM

## 2014-08-14 NOTE — Progress Notes (Signed)
Occupational Therapy Evaluation Patient Details Name: Kaitlyn Castillo MRN: [id number] DOB: 01/01/1961 Today's Date: 08/14/2014    History of Present Illness 53 y.o. female admitted to [facility] on 08/13/14 s/p elective L TKA.  Pt with significant PMHx of DM, HTN, closed head injury, and multiple right knee surgeries (R quad tendon repair).      Clinical Impression   PTA pt lived at home and was independent with ADLs and functional mobility. Pt lethargic due to pain medications this date and reports feeling panicked at the thought of attempting the stairs at home to her bedroom and has requested a hospital bed at this time. Pt required min (A) to stand and transfer to bed and requires assist for LB ADLs. Pt would benefit from skilled OT to address tub transfer and LB ADLs.     Follow Up Recommendations  No OT follow up;Supervision/Assistance - 24 hour    Equipment Recommendations  3 in 1 bedside comode    Recommendations for Other Services       Precautions / Restrictions Precautions Precautions: Fall;Knee Required Braces or Orthoses: Knee Immobilizer - Left Restrictions Weight Bearing Restrictions: Yes LLE Weight Bearing: Weight bearing as tolerated      Mobility Bed Mobility Overal bed mobility: Needs Assistance Bed Mobility: Sit to Supine       Sit to supine: Min assist   General bed mobility comments: minimal (A) to manage LLE onto bed. Pt able to manage UB with use of bedrails.   Transfers Overall transfer level: Needs assistance Equipment used: Rolling walker (2 wheeled) Transfers: Sit to/from Stand Sit to Stand: Min assist Stand pivot transfers: Min assist       General transfer comment: VC's for hand placement; min (A) to stabilize RW         ADL Overall ADL's : Needs assistance/impaired Eating/Feeding: Independent;Sitting   Grooming: Min guard;Standing   Upper Body Bathing: Set up;Sitting   Lower Body Bathing: Minimal assistance;Sit to/from stand    Upper Body Dressing : Min guard;Sitting   Lower Body Dressing: Moderate assistance;Sit to/from stand   Toilet Transfer: Minimal assistance;Ambulation;RW   Toileting- Clothing Manipulation and Hygiene: Minimal assistance;Sit to/from stand       Functional mobility during ADLs: Min guard;Rolling walker General ADL Comments: Pt reports that her son will be home to assist with LB ADLs. Pt required min (A) to stand and stabilize and paused frequently during ambulation to bed.      Vision  No apparent visual deficits.                    Perception Perception Perception Tested?: No   Praxis Praxis Praxis tested?: Within functional limits    Pertinent Vitals/Pain Pain Assessment: No/denies pain     Hand Dominance Right   Extremity/Trunk Assessment Upper Extremity Assessment Upper Extremity Assessment: Overall WFL for tasks assessed   Lower Extremity Assessment Lower Extremity Assessment: Defer to PT evaluation   Cervical / Trunk Assessment Cervical / Trunk Assessment: Normal   Communication Communication Communication: No difficulties   Cognition Arousal/Alertness: Lethargic;Suspect due to medications Behavior During Therapy: [facility] for tasks assessed/performed Overall Cognitive Status: Within Functional Limits for tasks assessed                                Home Living Family/patient expects to be discharged to:: Private residence Living Arrangements: Children Available Help at Discharge: Family;Available PRN/intermittently Type of Home: [place]  Access: Stairs to enter [employer] of Steps: 1 Entrance Stairs-Rails: None Home Layout: Two level;1/2 bath on main level;Bed/bath upstairs Alternate Level Stairs-Number of Steps: flight Alternate Level Stairs-Rails: Left Bathroom Shower/Tub: Tub/shower unit Shower/tub characteristics: Curtain Biochemist, clinical: Standard     Home Equipment: Environmental consultant - 2 wheels;Bedside commode;Shower seat  (CPM)          Prior Functioning/Environment Level of Independence: Independent             OT Diagnosis: Generalized weakness;Acute pain   OT Problem List: Decreased strength;Decreased activity tolerance;Decreased range of motion;Impaired balance (sitting and/or standing);Decreased safety awareness;Decreased knowledge of use of DME or AE;Decreased knowledge of precautions;Pain   OT Treatment/Interventions: Self-care/ADL training;Therapeutic exercise;Energy conservation;DME and/or AE instruction;Therapeutic activities;Patient/family education;Balance training    OT Goals(Current goals can be found in the care plan section) Acute Rehab OT Goals Patient Stated Goal: to go home when she is done in the hospital OT Goal Formulation: With patient Time For Goal Achievement: 08/21/14 Potential to Achieve Goals: Good  OT Frequency: Min 2X/week    End of Session Equipment Utilized During Treatment: Left knee immobilizer;Rolling walker;Gait belt;Other (comment) (CPM) CPM Left Knee CPM Left Knee: On Left Knee Flexion (Degrees): 60 Left Knee Extension (Degrees): 0  Activity Tolerance: Patient tolerated treatment well Patient left: in bed;with call bell/phone within reach   Time: 1429-1455 OT Time Calculation (min): 26 min Charges:  OT General Charges $OT Visit: 1 Procedure OT Evaluation $Initial OT Evaluation Tier I: 1 Procedure OT Treatments $Self Care/Home Management : 8-22 mins  Juluis Rainier [phone] 08/14/2014, 3:20 PM

## 2014-08-14 NOTE — Progress Notes (Signed)
Physical Therapy Treatment Patient Details Name: Kaitlyn Castillo MRN: [id number] DOB: 08-04-1961 Today's Date: 08/14/2014    History of Present Illness 53 y.o. female admitted to [facility] on 08/13/14 s/p elective L TKA.  Pt with significant PMHx of DM, HTN, closed head injury, and multiple right knee surgeries (R quad tendon repair).       PT Comments    Excellent progress with gait and mobility.  PT to continue per POC.  Follow Up Recommendations  Home health PT;Supervision/Assistance - 24 hour     Equipment Recommendations  Hospital bed    Recommendations for Other Services       Precautions / Restrictions Precautions Precautions: Fall;Knee Required Braces or Orthoses: Knee Immobilizer - Left Restrictions Weight Bearing Restrictions: Yes LLE Weight Bearing: Weight bearing as tolerated    Mobility  Bed Mobility Overal bed mobility: Needs Assistance Bed Mobility: Supine to Sit     Supine to sit: Min assist     General bed mobility comments: min assist to help progress her left leg to EOB.  Pt able to manage upper body with the use of the railing.   Transfers Overall transfer level: Needs assistance Equipment used: Rolling walker (2 wheeled) Transfers: Sit to/from [facility] Sit to Stand: Min assist Stand pivot transfers: Min assist       General transfer comment: verbal cues for hand placement, sequencing  Ambulation/Gait Ambulation/Gait assistance: Min guard Ambulation Distance (Feet): 40 Feet Assistive device: Rolling walker (2 wheeled) Gait Pattern/deviations: Step-to pattern;Antalgic   Gait velocity interpretation: Below normal speed for age/gender General Gait Details: Not tested   Stairs            Wheelchair Mobility    Modified Rankin (Stroke Patients Only)       Balance   Sitting-balance support: Feet supported;No upper extremity supported Sitting balance-Leahy Scale: Good     Standing balance support: Bilateral  upper extremity supported Standing balance-Leahy Scale: Poor Standing balance comment: needs external assist to stand                    Cognition Arousal/Alertness: Awake/alert Behavior During Therapy: WFL for tasks assessed/performed Overall Cognitive Status: Within Functional Limits for tasks assessed                      Exercises Total Joint Exercises Ankle Circles/Pumps: AROM;Both;10 reps Quad Sets: AROM;Left;10 reps Heel Slides: AAROM;Left;10 reps Hip ABduction/ADduction: AAROM;Left;10 reps Goniometric ROM: 0-40 AAROM L knee in recliner    General Comments        Pertinent Vitals/Pain Pain Assessment: 0-10 Pain Score: 1  Pain Location: left knee Pain Intervention(s): Monitored during session    Home Living Family/patient expects to be discharged to:: Private residence Living Arrangements: Children Available Help at Discharge: Family;Available PRN/intermittently ("not all the time, but most of the time") Type of Home: House Home Access: Stairs to enter Entrance Stairs-Rails: None Home Layout: Two level;1/2 bath on main level;Bed/bath upstairs Home Equipment: Walker - 2 wheels;Bedside commode;Shower seat (CPM) Additional Comments: would like to stay downstairs as her bedroom is upstairs, would like a hospital bed.     Prior Function Level of Independence: Independent          PT Goals (current goals can now be found in the care plan section) Acute Rehab PT Goals Patient Stated Goal: to go home when she is done in the hospital PT Goal Formulation: With patient Time For Goal Achievement: 08/21/14 Potential to Achieve Goals:  Good Progress towards PT goals: Progressing toward goals    Frequency  7X/week    PT Plan Current plan remains appropriate    Co-evaluation             End of Session Equipment Utilized During Treatment: Gait belt;Left knee immobilizer Activity Tolerance: Patient tolerated treatment well Patient left: in  chair;with call bell/phone within reach     Time: 3009-2330 PT Time Calculation (min): 39 min  Charges:  $Gait Training: 23-37 mins $Therapeutic Exercise: 8-22 mins                    G Codes:      [name] 08/14/2014, 12:21 PM

## 2014-08-14 NOTE — Discharge Instructions (Signed)
Information on my medicine - Coumadin   (Warfarin)  This medication education was reviewed with me or my healthcare representative as part of my discharge preparation.  The pharmacist that spoke with me during my hospital stay was:  Arman Bogus, [facility]  Why was Coumadin prescribed for you? Coumadin was prescribed for you because you have a blood clot or a medical condition that can cause an increased risk of forming blood clots. Blood clots can cause serious health problems by blocking the flow of blood to the heart, lung, or brain. Coumadin can prevent harmful blood clots from forming. As a reminder your indication for Coumadin is:   Select from menu  What test will check on my response to Coumadin? While on Coumadin (warfarin) you will need to have an INR test regularly to ensure that your dose is keeping you in the desired range. The INR (international normalized ratio) number is calculated from the result of the laboratory test called prothrombin time (PT).  If an INR APPOINTMENT HAS NOT ALREADY BEEN MADE FOR YOU please schedule an appointment to have this lab work done by your health care provider within 7 days. Your INR goal is usually a number between:  2 to 3 or your provider may give you a more narrow range like 2-2.5.  Ask your health care provider during an office visit what your goal INR is.  What  do you need to  know  About  COUMADIN? Take Coumadin (warfarin) exactly as prescribed by your healthcare provider about the same time each day.  DO NOT stop taking without talking to the doctor who prescribed the medication.  Stopping without other blood clot prevention medication to take the place of Coumadin may increase your risk of developing a new clot or stroke.  Get refills before you run out.  What do you do if you miss a dose? If you miss a dose, take it as soon as you remember on the same day then continue your regularly scheduled regimen the next day.  Do not take two doses  of Coumadin at the same time.  Important Safety Information A possible side effect of Coumadin (Warfarin) is an increased risk of bleeding. You should call your healthcare provider right away if you experience any of the following:   Bleeding from an injury or your nose that does not stop.   Unusual colored urine (red or dark brown) or unusual colored stools (red or black).   Unusual bruising for unknown reasons.   A serious fall or if you hit your head (even if there is no bleeding).  Some foods or medicines interact with Coumadin (warfarin) and might alter your response to warfarin. To help avoid this:   Eat a balanced diet, maintaining a consistent amount of Vitamin K.   Notify your provider about major diet changes you plan to make.   Avoid alcohol or limit your intake to 1 drink for women and 2 drinks for men per day. (1 drink is 5 oz. wine, 12 oz. beer, or 1.5 oz. liquor.)  Make sure that ANY health care provider who prescribes medication for you knows that you are taking Coumadin (warfarin).  Also make sure the healthcare provider who is monitoring your Coumadin knows when you have started a new medication including herbals and non-prescription products.  Coumadin (Warfarin)  Major Drug Interactions  Increased Warfarin Effect Decreased Warfarin Effect  Alcohol (large quantities) Antibiotics (esp. Septra/Bactrim, Flagyl, Cipro) Amiodarone (Cordarone) Aspirin (ASA) Cimetidine (Tagamet)  Megestrol (Megace) NSAIDs (ibuprofen, naproxen, etc.) Piroxicam (Feldene) Propafenone (Rythmol SR) Propranolol (Inderal) Isoniazid (INH) Posaconazole (Noxafil) Barbiturates (Phenobarbital) Carbamazepine (Tegretol) Chlordiazepoxide (Librium) Cholestyramine (Questran) Griseofulvin Oral Contraceptives Rifampin Sucralfate (Carafate) Vitamin K   Coumadin (Warfarin) Major Herbal Interactions  Increased Warfarin Effect Decreased Warfarin Effect  Garlic Ginseng Ginkgo biloba Coenzyme  Q10 Green tea St. Johns wort    Coumadin (Warfarin) FOOD Interactions  Eat a consistent number of servings per week of foods HIGH in Vitamin K (1 serving =  cup)  Collards (cooked, or boiled & drained) Kale (cooked, or boiled & drained) Mustard greens (cooked, or boiled & drained) Parsley *serving size only =  cup Spinach (cooked, or boiled & drained) Swiss chard (cooked, or boiled & drained) Turnip greens (cooked, or boiled & drained)  Eat a consistent number of servings per week of foods MEDIUM-HIGH in Vitamin K (1 serving = 1 cup)  Asparagus (cooked, or boiled & drained) Broccoli (cooked, boiled & drained, or raw & chopped) Brussel sprouts (cooked, or boiled & drained) *serving size only =  cup Lettuce, raw (green leaf, endive, romaine) Spinach, raw Turnip greens, raw & chopped   These websites have more information on Coumadin (warfarin):  FailFactory.se; VeganReport.com.au;

## 2014-08-14 NOTE — Progress Notes (Signed)
Orthopedic Tech Progress Note Patient Details:  Kaitlyn Castillo 1961/12/09 [id number]  CPM Left Knee CPM Left Knee: On Left Knee Flexion (Degrees): 50 Left Knee Extension (Degrees): 0 Additional Comments: order to start POD#1   Cammer, Theodoro Parma 08/14/2014, 1:47 PM

## 2014-08-14 NOTE — Progress Notes (Signed)
ANTICOAGULATION CONSULT NOTE -Follow up  Pharmacy Consult for Coumadin  Indication: VTE prophylaxis s/p L TKA   Allergies  Allergen Reactions  . Aleve [Naproxen Sodium] Hives  . Iodine Hives    Certain iodine in fish  . Iohexol      Code: HIVES, Desc: REPORTED HIVES, PT STATES SHE DOES FINE IF SHE HAS A BENADRYL FIRST, ARS 02/09/09   . Percocet [Oxycodone-Acetaminophen] Hives and Itching    Paronoid    Patient Measurements: Height: 5\' 4"  (162.6 cm) Weight: 268 lb (121.564 kg) IBW/kg (Calculated) : 54.7  Vital Signs: Temp: 98.9 F (37.2 C) (09/02 0557) BP: 134/68 mmHg (09/02 0557) Pulse Rate: 96 (09/02 0557)  Labs:  Recent Labs  08/14/14 0605  HGB 9.9*  HCT 30.5*  PLT 158  LABPROT 15.4*  INR 1.22  CREATININE 1.34*    Estimated Creatinine Clearance: 62.5 ml/min (by C-G formula based on Cr of 1.34).   Medical History: Past Medical History  Diagnosis Date  . Knee pain, right   . Diabetes mellitus   . Hypertension   . Obese   . High cholesterol   . Head injury, closed, with brief LOC   . Shortness of breath     with too much fluid- not currently  . Constipation   . Arthritis     Medications:  Prescriptions prior to admission  Medication Sig Dispense Refill  . acetaminophen-codeine (TYLENOL #3) 300-30 MG per tablet Take 1-2 tablets by mouth every 6 (six) hours as needed for pain.  15 tablet  0  . atenolol (TENORMIN) 25 MG tablet Take 25 mg by mouth daily.      . diclofenac sodium (VOLTAREN) 1 % GEL Apply 2 g topically 4 (four) times daily as needed (pain).      Marland Kitchen exenatide (BYETTA 10 MCG PEN) 10 MCG/0.04ML SOPN injection Inject 10 mcg into the skin 2 (two) times daily with a meal.      . glipiZIDE-metformin (METAGLIP) 5-500 MG per tablet Take 1 tablet by mouth every evening.       Marland Kitchen lisinopril-hydrochlorothiazide (PRINZIDE,ZESTORETIC) 20-25 MG per tablet Take 2 tablets by mouth daily.       . phentermine 37.5 MG capsule Take 37.5 mg by mouth every morning.       . psyllium (METAMUCIL) 58.6 % powder Take 1 packet by mouth daily.        Assessment: INR is 1.22 today in this 10 YOF with left knee osteoarthritis who is s/p L TKA- POD#1. Pharmacy consulted to start Coumadin for VTE prophylaxis. Baseline INR 1.04. H/H and Plt decreased from preop labs.  H/H 9.9/30.5, pltc 158K.  MD notes patient is stable today. No bleeding reported.  SCR increased to 1.34 today from 0.97.  I/O 4651/1500  Goal of Therapy:  INR 2-3 Monitor platelets by anticoagulation protocol: Yes   Plan:  -Coumadin 7.5 mg x 1 dose  -Daily PT/INR -Start Coumadin education  Nicole Cella, RPh Clinical Pharmacist Pager: [phone] 08/14/2014, 12: 15 PM

## 2014-08-14 NOTE — Evaluation (Signed)
Physical Therapy Evaluation Patient Details Name: Kaitlyn Castillo MRN: [id number] DOB: [birth year] Today's Date: 08/14/2014   History of Present Illness  53 y.o. female admitted to [facility] on 08/13/14 s/p elective L TKA.  Pt with significant PMHx of DM, HTN, closed head injury, and multiple right knee surgeries (R quad tendon repair).     Clinical Impression  Pt is POD #0 and is moving slowly with min assist and a RW.  She reports that she wants to go home with home therapy f/u at discharge and a hospital bed so she can stay downstairs.  This will be up to her progression with therapy.  It does not sound like she has 24/7 assist at discharge either.   PT to follow acutely for deficits listed below.       Follow Up Recommendations Home health PT;Supervision/Assistance - 24 hour    Equipment Recommendations  Hospital bed    Recommendations for Other Services    NA    Precautions / Restrictions Precautions Precautions: Fall;Knee Required Braces or Orthoses: Knee Immobilizer - Left Restrictions Weight Bearing Restrictions: Yes LLE Weight Bearing: Weight bearing as tolerated      Mobility  Bed Mobility Overal bed mobility: Needs Assistance Bed Mobility: Supine to Sit     Supine to sit: Min assist     General bed mobility comments: min assist to help progress her left leg to EOB.  Pt able to manage upper body with the use of the railing.   Transfers Overall transfer level: Needs assistance Equipment used: Rolling walker (2 wheeled) Transfers: Sit to/from [facility] Sit to Stand: Min assist Stand pivot transfers: Min assist       General transfer comment: Min assist to support trunk from elevated bed.  Verbal cues for safe RW use.   Ambulation/Gait             General Gait Details: Not tested  Stairs            Wheelchair Mobility    Modified Rankin (Stroke Patients Only)       Balance   Sitting-balance support: Feet supported;No upper  extremity supported Sitting balance-Leahy Scale: Good     Standing balance support: Bilateral upper extremity supported Standing balance-Leahy Scale: Poor Standing balance comment: needs external assist to stand                             Pertinent Vitals/Pain Pain Assessment: 0-10 Pain Score: 5  Pain Location: 'shank" of the left knee Pain Intervention(s): Limited activity within patient's tolerance;Monitored during session;Repositioned    Home Living Family/patient expects to be discharged to:: Private residence Living Arrangements: Children Available Help at Discharge: Family;Available PRN/intermittently ("not all the time, but most of the time") Type of Home: House Home Access: Stairs to enter Entrance Stairs-Rails: None Entrance Stairs-Number of Steps: 1 Home Layout: Two level;1/2 bath on main level;Bed/bath upstairs Home Equipment: Walker - 2 wheels;Bedside commode;Shower seat (CPM) Additional Comments: would like to stay downstairs as her bedroom is upstairs, would like a hospital bed.     Prior Function Level of Independence: Independent               Hand Dominance   Dominant Hand: Right    Extremity/Trunk Assessment   Upper Extremity Assessment: Defer to OT evaluation           Lower Extremity Assessment: RLE deficits/detail;LLE deficits/detail RLE Deficits / Details: pt reports h/o right  leg multiple quad tendon repair.  It gives away on her coming down the stairs.     LLE Deficits / Details: left leg with normal post op pain and weakness, ankle 3/4, knee 2/5, hip 2+/5 flexion  Cervical / Trunk Assessment: Normal  Communication   Communication: No difficulties  Cognition Arousal/Alertness: Awake/alert Behavior During Therapy: WFL for tasks assessed/performed Overall Cognitive Status: Within Functional Limits for tasks assessed                      General Comments      Exercises        Assessment/Plan    PT  Assessment Patient needs continued PT services  PT Diagnosis Difficulty walking;Abnormality of gait;Generalized weakness;Acute pain   PT Problem List Decreased strength;Decreased activity tolerance;Decreased range of motion;Decreased balance;Decreased mobility;Decreased knowledge of use of DME;Decreased knowledge of precautions;Impaired sensation;Pain  PT Treatment Interventions DME instruction;Gait training;Stair training;Functional mobility training;Therapeutic activities;Therapeutic exercise;Balance training;Neuromuscular re-education;Patient/family education;Manual techniques;Modalities   PT Goals (Current goals can be found in the Care Plan section) Acute Rehab PT Goals Patient Stated Goal: to go home when she is done in the hospital PT Goal Formulation: With patient Time For Goal Achievement: 08/21/14 Potential to Achieve Goals: Good    Frequency 7X/week   Barriers to discharge Inaccessible home environment      Co-evaluation               End of Session Equipment Utilized During Treatment: Gait belt;Left knee immobilizer Activity Tolerance: Patient limited by fatigue;Patient limited by pain Patient left: in chair;with call bell/phone within reach Nurse Communication: Mobility status (likely 2 person assist to get out of low chair)         Time: 0762-2633 PT Time Calculation (min): 33 min   Charges:  1EV 1TA       PT G Codes:          Evan Mackie B. [place], [place], DPT [phone]   08/14/2014, 9:05 AM

## 2014-08-14 NOTE — Progress Notes (Signed)
Subjective: Pt stable - pain ok - needs cpm - ordered yesterday - not on yet   Objective: Vital signs in last 24 hours: Temp:  [97.2 F (36.2 C)-100.5 F (38.1 C)] 98.9 F (37.2 C) (09/02 0557) Pulse Rate:  [78-105] 96 (09/02 0557) Resp:  [12-20] 16 (09/02 0557) BP: (119-165)/(58-96) 134/68 mmHg (09/02 0557) SpO2:  [98 %-100 %] 99 % (09/02 0557) FiO2 (%):  [28 %] 28 % (09/01 1333)  Intake/Output from previous day: 09/01 0701 - 09/02 0700 In: 4651.3 [P.O.:780; I.V.:3671.3; IV Piggyback:200] Out: 1500 [Urine:1400; Blood:100] Intake/Output this shift:    Exam:  Dorsiflexion/Plantar flexion intact Compartment soft  Labs:  Recent Labs  08/14/14 0605  HGB 9.9*    Recent Labs  08/14/14 0605  WBC 5.5  RBC 3.71*  HCT 30.5*  PLT 158    Recent Labs  08/14/14 0605  NA 138  K 4.6  CL 105  CO2 26  BUN 22  CREATININE 1.34*  GLUCOSE 162*  CALCIUM 8.0*    Recent Labs  08/14/14 0605  INR 1.22    Assessment/Plan: Plan cpm and PT today   Alsha Meland SCOTT 08/14/2014, 8:21 AM

## 2014-08-15 ENCOUNTER — Encounter (HOSPITAL_COMMUNITY): Payer: Self-pay | Admitting: Orthopedic Surgery

## 2014-08-15 LAB — CBC
HEMATOCRIT: 28.8 % — AB (ref 36.0–46.0)
HEMOGLOBIN: 9.5 g/dL — AB (ref 12.0–15.0)
MCH: 26.3 pg (ref 26.0–34.0)
MCHC: 33 g/dL (ref 30.0–36.0)
MCV: 79.8 fL (ref 78.0–100.0)
Platelets: 179 10*3/uL (ref 150–400)
RBC: 3.61 MIL/uL — AB (ref 3.87–5.11)
RDW: 14.9 % (ref 11.5–15.5)
WBC: 7.8 10*3/uL (ref 4.0–10.5)

## 2014-08-15 LAB — GLUCOSE, CAPILLARY
GLUCOSE-CAPILLARY: 154 mg/dL — AB (ref 70–99)
Glucose-Capillary: 195 mg/dL — ABNORMAL HIGH (ref 70–99)
Glucose-Capillary: 198 mg/dL — ABNORMAL HIGH (ref 70–99)
Glucose-Capillary: 192 mg/dL — ABNORMAL HIGH (ref 70–99)

## 2014-08-15 LAB — PROTIME-INR
INR: 1.48 (ref 0.00–1.49)
PROTHROMBIN TIME: 17.9 s — AB (ref 11.6–15.2)

## 2014-08-15 MED ORDER — WARFARIN SODIUM 7.5 MG PO TABS
7.5000 mg | ORAL_TABLET | Freq: Once | ORAL | Status: AC
  Administered 2014-08-15: 7.5 mg via ORAL
  Filled 2014-08-15: qty 1

## 2014-08-15 NOTE — Progress Notes (Signed)
Occupational Therapy Treatment Patient Details Name: Kaitlyn Castillo MRN: [id number] DOB: July 09, 1961 Today's Date: 08/15/2014    History of present illness 53 y.o. female admitted to [facility] on 08/13/14 s/p elective L TKA.  Pt with significant PMHx of DM, HTN, closed head injury, and multiple right knee surgeries (R quad tendon repair).      OT comments  Pt seen today for ADLs and functional mobility. Pt making excellent progress with mobility and was able to stand in bathroom at sink to perform grooming and bathing. Educated on fall prevention and safety during ADLs throughout session.    Follow Up Recommendations  No OT follow up;Supervision/Assistance - 24 hour    Equipment Recommendations  3 in 1 bedside comode    Recommendations for Other Services      Precautions / Restrictions Precautions Precautions: Fall;Knee Precaution Comments: reviewed knee precautions Required Braces or Orthoses: Knee Immobilizer - Left Restrictions Weight Bearing Restrictions: Yes LLE Weight Bearing: Weight bearing as tolerated       Mobility Bed Mobility Overal bed mobility: Modified Independent Bed Mobility: Sit to Supine       Sit to supine: Supervision   General bed mobility comments: Supervision for safety. VC for technique. Use of bed rail.  Transfers Overall transfer level: Needs assistance Equipment used: Rolling walker (2 wheeled) Transfers: Sit to/from Stand Sit to Stand: Supervision         General transfer comment: Pt has good technique.        ADL Overall ADL's : Needs assistance/impaired     Grooming: Supervision/safety;Standing   Upper Body Bathing: Supervision/ safety;Standing;Set up   Lower Body Bathing: Minimal assistance;Sit to/from stand Lower Body Bathing Details (indicate cue type and reason): assist for LLE Upper Body Dressing : Supervision/safety;Set up;Standing       Toilet Transfer: Supervision/safety;Ambulation;BSC;RW   Toileting- Clothing  Manipulation and Hygiene: Supervision/safety;Sit to/from stand       Functional mobility during ADLs: Supervision/safety;Rolling walker General ADL Comments: Pt requested to wash up in bathroom. Performed bed mobility to get EOB then ambulated to bathroom with Supervision. Pt performed toileting and transfer, then stood at sink to bathe self using soap and wash cloths with Supervision. Pt unable to reach LLE for bathing due to knee immobilizer, however able to wash RLE safely.                 Cognition  Arousal/Alertness: Awake/Alert Behavior During Therapy: WFL for tasks assessed/performed Overall Cognitive Status: Within Functional Limits for tasks assessed                                    Pertinent Vitals/ Pain       No/denies pain         Frequency Min 2X/week     Progress Toward Goals  OT Goals(current goals can now be found in the care plan section)  Progress towards OT goals: Progressing toward goals  Acute Rehab OT Goals Patient Stated Goal: to keep making progress OT Goal Formulation: With patient Time For Goal Achievement: 08/21/14 Potential to Achieve Goals: Good ADL Goals Pt Will Perform Lower Body Bathing: with set-up;with supervision;sit to/from stand;with adaptive equipment Pt Will Perform Lower Body Dressing: with set-up;with supervision;with adaptive equipment;sit to/from stand Pt Will Transfer to Toilet: with modified independence;ambulating;bedside commode Pt Will Perform Toileting - Clothing Manipulation and hygiene: with modified independence;sit to/from stand Pt Will Perform Tub/Shower Transfer: Tub transfer;with supervision;ambulating;3  in 1;rolling walker  Plan Discharge plan remains appropriate       End of Session Equipment Utilized During Treatment: Left knee immobilizer;Rolling walker   Activity Tolerance Patient tolerated treatment well   Patient Left in chair;with call bell/phone within reach;with family/visitor present    Nurse Communication          Time: 5953-9672 OT Time Calculation (min): 56 min  Charges: OT General Charges $OT Visit: 1 Procedure OT Treatments $Self Care/Home Management : 53-67 mins  Juluis Rainier [phone] 08/15/2014, 5:02 PM

## 2014-08-15 NOTE — Progress Notes (Signed)
Called ortho tech to request zero knee for patient, was told we are currently out.

## 2014-08-15 NOTE — Care Management Note (Signed)
CARE MANAGEMENT NOTE 08/15/2014  Patient:  Kaitlyn Castillo, Kaitlyn Castillo   Account Number:  [id number]  Date Initiated:  08/15/2014  Documentation initiated by:  Ricki Miller  Subjective/Objective Assessment:   53 yr old female admitted with osteoarthritis of left knee, s/p left total knee arthroplasty.     Action/Plan:   Patient preoperatively setup [facility], no changes. Patient has family support at discharge.   Anticipated DC Date:  08/16/2014   Anticipated DC Plan:  [place]  CM consult      PAC Choice  [place]   Choice offered to / List presented to:  C-1 Patient   DME arranged  [place]  3-N-1  CP[facility]      DME agency  [employer]     [place] arranged  HH-2 PT  HH-1 RN      [place]   Status of service:   Medicare Important Message given?   (If response is "NO", the following Medicare IM given date fields will be blank) Date Medicare IM given:   Medicare IM given by:   Date Additional Medicare IM given:   Additional Medicare IM given by:    Discharge Disposition:  [place]  Per UR Regulation:  Reviewed for med. necessity/level of care/duration of stay

## 2014-08-15 NOTE — Progress Notes (Signed)
Physical Therapy Treatment Patient Details Name: Kaitlyn Castillo MRN: [id number] DOB: 1961/07/15 Today's Date: 08/15/2014    History of Present Illness      PT Comments    Treatment limited by pain today.  Pt declined stair training review but was agreeable to gait and exercises.  Good progress noted with gait distance.  PT to continue per POC.  Follow Up Recommendations  Home health PT;Supervision/Assistance - 24 hour     Equipment Recommendations  Hospital bed    Recommendations for Other Services       Precautions / Restrictions Precautions Precautions: Fall;Knee Required Braces or Orthoses: Knee Immobilizer - Left Restrictions LLE Weight Bearing: Weight bearing as tolerated    Mobility  Bed Mobility                  Transfers   Equipment used: Rolling walker (2 wheeled)   Sit to Stand: Min guard Stand pivot transfers: Min guard       General transfer comment: verbal cues for safety, pt attempting to stand with one foot outside of the base of RW  Ambulation/Gait Ambulation/Gait assistance: Supervision Ambulation Distance (Feet): 120 Feet Assistive device: Rolling walker (2 wheeled) Gait Pattern/deviations: Antalgic;Decreased stride length;Step-to pattern   Gait velocity interpretation: Below normal speed for age/gender     Stairs            Wheelchair Mobility    Modified Rankin (Stroke Patients Only)       Balance                                    Cognition Arousal/Alertness: Awake/alert Behavior During Therapy: WFL for tasks assessed/performed Overall Cognitive Status: Within Functional Limits for tasks assessed                      Exercises Total Joint Exercises Ankle Circles/Pumps: AROM;Both;10 reps Quad Sets: AROM;Left;10 reps Heel Slides: AAROM;Left;10 reps Hip ABduction/ADduction: AAROM;Left;10 reps    General Comments        Pertinent Vitals/Pain Pain Assessment: 0-10 Pain Score: 8   Pain Intervention(s): Monitored during session;Ice applied;Limited activity within patient's tolerance    Home Living                      Prior Function            PT Goals (current goals can now be found in the care plan section) Progress towards PT goals: Progressing toward goals    Frequency  7X/week    PT Plan Current plan remains appropriate    Co-evaluation             End of Session Equipment Utilized During Treatment: Gait belt;Left knee immobilizer Activity Tolerance: Patient tolerated treatment well Patient left: in chair;with call bell/phone within reach     Time: 6712-4580 PT Time Calculation (min): 32 min  Charges:  $Gait Training: 8-22 mins $Therapeutic Exercise: 8-22 mins                    G Codes:      [name] 08/15/2014, 12:08 PM

## 2014-08-15 NOTE — Progress Notes (Signed)
Subjective: Pt stable - has done room amb   Objective: Vital signs in last 24 hours: Temp:  [98.1 F (36.7 C)-98.9 F (37.2 C)] 98.4 F (36.9 C) (09/03 0413) Pulse Rate:  [86-100] 87 (09/03 0413) Resp:  [19-20] 19 (09/03 0413) BP: (125-128)/(58-77) 128/76 mmHg (09/03 0413) SpO2:  [99 %-100 %] 100 % (09/03 0413)  Intake/Output from previous day: 09/02 0701 - 09/03 0700 In: 900 [P.O.:900] Out: -  Intake/Output this shift:    Exam:  Intact pulses distally Dorsiflexion/Plantar flexion intact  Labs:  Recent Labs  08/14/14 0605  HGB 9.9*    Recent Labs  08/14/14 0605  WBC 5.5  RBC 3.71*  HCT 30.5*  PLT 158    Recent Labs  08/14/14 0605  NA 138  K 4.6  CL 105  CO2 26  BUN 22  CREATININE 1.34*  GLUCOSE 162*  CALCIUM 8.0*    Recent Labs  08/14/14 0605  INR 1.22    Assessment/Plan: Doing well plan pt cpm today - dc am   Emi Lymon SCOTT 08/15/2014, 8:27 AM

## 2014-08-15 NOTE — Progress Notes (Signed)
Inpatient Diabetes Program Recommendations  AACE/ADA: New Consensus Statement on Inpatient Glycemic Control (2013)  Target Ranges:  Prepandial:   less than 140 mg/dL      Peak postprandial:   less than 180 mg/dL (1-2 hours)      Critically ill patients:  140 - 180 mg/dL   Results for MILAROSE, SAVICH (MRN [id number]) as of 08/15/2014 08:53  Ref. Range 08/14/2014 12:03 08/14/2014 16:52 08/14/2014 21:35 08/15/2014 06:38  Glucose-Capillary Latest Range: 70-99 mg/dL 230 (H) 204 (H) 144 (H) 154 (H)    Reason for Assessment: Elevated CBG  Diabetes history: Type 2 Outpatient Diabetes medications: Glipizide-Metfromin 5-500mg  1/day, Byetta 10mg  bid with meals Current orders for Inpatient glycemic control: Glipizide 5mg /day, Glucophage 500mg  1/day, Byetta 10mg  bid with meals (currently not available), Novolog 0-15units tid with meals   Consider changing current diet order to carb modified.   Gentry Fitz, RN, BA, MHA, CDE Diabetes Coordinator Inpatient Diabetes Program  [phone] (Team Pager) [phone] [facility]) 08/15/2014 9:12 AM

## 2014-08-15 NOTE — Progress Notes (Signed)
Physical Therapy Treatment Patient Details Name: Kaitlyn Castillo MRN: [id number] DOB: 1961-12-11 Today's Date: 08/15/2014    History of Present Illness 53 y.o. female admitted to [facility] on 08/13/14 s/p elective L TKA.  Pt with significant PMHx of DM, HTN, closed head injury, and multiple right knee surgeries (R quad tendon repair).       PT Comments    Patient continues to progress well towards physical therapy goals. Ambulating at a supervision level up to 130 feet with a rolling walker, demonstrates a very slow and guarded gait pattern. Tolerating therapeutic exercises well. Plan to complete further stair training in AM. Patient will continue to benefit from skilled physical therapy services to further improve independence with functional mobility.   Follow Up Recommendations  Home health PT;Supervision/Assistance - 24 hour     Equipment Recommendations  Hospital bed    Recommendations for Other Services       Precautions / Restrictions Precautions Precautions: Fall;Knee Precaution Comments: reviewed knee precautions Required Braces or Orthoses: Knee Immobilizer - Left Restrictions Weight Bearing Restrictions: Yes LLE Weight Bearing: Weight bearing as tolerated    Mobility  Bed Mobility Overal bed mobility: Needs Assistance Bed Mobility: Sit to Supine       Sit to supine: Supervision   General bed mobility comments: Supervision for safety. VC for technique. Use of bed rail.  Transfers Overall transfer level: Needs assistance Equipment used: Rolling walker (2 wheeled) Transfers: Sit to/from Stand Sit to Stand: Min guard Stand pivot transfers: Min guard       General transfer comment: Min guard for safety from reclining chair. Safely places hands on chair for push off. Good stability upon standing.  Ambulation/Gait Ambulation/Gait assistance: Supervision Ambulation Distance (Feet): 130 Feet Assistive device: Rolling walker (2 wheeled) Gait Pattern/deviations:  Step-through pattern;Decreased step length - right;Decreased stance time - left;Antalgic   Gait velocity interpretation: Below normal speed for age/gender General Gait Details: Slow and guarded gait. Focused on step symmetry with step through gait pattern.  VC for left knee extension in stance phase for quad activation and glute activation for upright posture.   Stairs            Wheelchair Mobility    Modified Rankin (Stroke Patients Only)       Balance                                    Cognition Arousal/Alertness: Awake/alert Behavior During Therapy: WFL for tasks assessed/performed Overall Cognitive Status: Within Functional Limits for tasks assessed                      Exercises Total Joint Exercises Ankle Circles/Pumps: AROM;Both;10 reps;Supine Quad Sets: AROM;Left;10 reps Heel Slides: AAROM;Left;10 reps Hip ABduction/ADduction: AAROM;Left;10 reps Straight Leg Raises: AROM;Left;10 reps;Supine Long Arc Quad: AROM;Left;10 reps;Seated Knee Flexion: AAROM;Left;10 reps;Seated    General Comments        Pertinent Vitals/Pain Pain Assessment: 0-10 Pain Score:  ("More sore today" no value given) Pain Location: Lt knee Pain Intervention(s): Limited activity within patient's tolerance;Monitored during session;Repositioned    Home Living                      Prior Function            PT Goals (current goals can now be found in the care plan section) Acute Rehab PT Goals Patient Stated Goal: Lt knee  sore from being in CPM last night PT Goal Formulation: With patient Time For Goal Achievement: 08/21/14 Potential to Achieve Goals: Good Progress towards PT goals: Progressing toward goals    Frequency  7X/week    PT Plan Current plan remains appropriate    Co-evaluation             End of Session Equipment Utilized During Treatment: Left knee immobilizer Activity Tolerance: Patient tolerated treatment well Patient  left: with call bell/phone within reach;in bed;in CPM;with family/visitor present     Time: 1431-1501 PT Time Calculation (min): 30 min  Charges:  $Gait Training: 8-22 mins $Therapeutic Exercise: 8-22 mins                    G Codes:      Kaitlyn Castillo, Kaitlyn  [name] 08/15/2014, 3:13 PM

## 2014-08-15 NOTE — Progress Notes (Signed)
ANTICOAGULATION CONSULT NOTE - Follow Up Consult  Pharmacy Consult for Coumadin Indication: VTE prophylaxis  Allergies  Allergen Reactions  . Aleve [Naproxen Sodium] Hives  . Iodine Hives    Certain iodine in fish  . Iohexol      Code: HIVES, Desc: REPORTED HIVES, PT STATES SHE DOES FINE IF SHE HAS A BENADRYL FIRST, ARS 02/09/09   . Percocet [Oxycodone-Acetaminophen] Hives and Itching    Paronoid    Patient Measurements: Height: 5\' 4"  (162.6 cm) Weight: 268 lb (121.564 kg) IBW/kg (Calculated) : 54.7 Heparin Dosing Weight:    Vital Signs: Temp: 98.8 F (37.1 C) (09/03 1300) BP: 109/38 mmHg (09/03 1300) Pulse Rate: 66 (09/03 1300)  Labs:  Recent Labs  08/14/14 0605 08/15/14 0733  HGB 9.9* 9.5*  HCT 30.5* 28.8*  PLT 158 179  LABPROT 15.4* 17.9*  INR 1.22 1.48  CREATININE 1.34*  --     Estimated Creatinine Clearance: 62.5 ml/min (by C-G formula based on Cr of 1.34).   Assessment: Admit Complaint: 35 YOF with left knee osteoarthritis who is s/p L TKA 08/13/14  PMH: Anxiety, DM, HTN, Depression   Anticoagulation: Coumadin for L TKA, BL INR 1.04, Coumadin points =6, Today INR 1.48, hgb 9.5, pltc 179, no bleeding noted, pt stable per MD  Cardiovascular: BP 109/38, HR 66-84 , atenolol, lisinopril/HCT Endocrinology: CBGs 144-230 , Byetta -await family to bring in, on SSI & glipizide/metfomin for now (Scr 1.34) Nephrology: Scr up 1.34 <0.97, lytes wnl Pulm: RA Hematology / Onc: Hgb 9.5. Plts 179 slightly up PTA Med Issues- voltaren gel prn Best Practices- warf new/ SCDs  Goal of Therapy:  INR 2-3 Monitor platelets by anticoagulation protocol: Yes   Plan:  Coumadin 7.5mg  po x 1 tonight. Daily INR.   Dmari Castillo S. Kaitlyn Castillo, PharmD, BCPS Clinical Staff Pharmacist Pager [phone]  Kaitlyn Castillo 08/15/2014,1:36 PM

## 2014-08-16 LAB — GLUCOSE, CAPILLARY
Glucose-Capillary: 158 mg/dL — ABNORMAL HIGH (ref 70–99)
Glucose-Capillary: 142 mg/dL — ABNORMAL HIGH (ref 70–99)

## 2014-08-16 LAB — CBC
HCT: 26.7 % — ABNORMAL LOW (ref 36.0–46.0)
Hemoglobin: 8.7 g/dL — ABNORMAL LOW (ref 12.0–15.0)
MCH: 26 pg (ref 26.0–34.0)
MCHC: 32.6 g/dL (ref 30.0–36.0)
MCV: 79.7 fL (ref 78.0–100.0)
Platelets: 172 K/uL (ref 150–400)
RBC: 3.35 MIL/uL — ABNORMAL LOW (ref 3.87–5.11)
RDW: 14.7 % (ref 11.5–15.5)
WBC: 7.6 K/uL (ref 4.0–10.5)

## 2014-08-16 LAB — PROTIME-INR
INR: 1.39 (ref 0.00–1.49)
Prothrombin Time: 17.1 seconds — ABNORMAL HIGH (ref 11.6–15.2)

## 2014-08-16 MED ORDER — DSS 100 MG PO CAPS: 100.0000 mg | ORAL_CAPSULE | Freq: Two times a day (BID) | ORAL | Status: DC

## 2014-08-16 MED ORDER — WARFARIN SODIUM 10 MG PO TABS
10.0000 mg | ORAL_TABLET | Freq: Once | ORAL | Status: DC
  Filled 2014-08-16: qty 1

## 2014-08-16 MED ORDER — ACETAMINOPHEN-CODEINE #3 300-30 MG PO TABS: 1.0000 | ORAL_TABLET | ORAL | Status: DC | PRN

## 2014-08-16 MED ORDER — WARFARIN SODIUM 10 MG PO TABS: 10.0000 mg | ORAL_TABLET | Freq: Once | ORAL | Status: DC

## 2014-08-16 MED ORDER — WARFARIN SODIUM 5 MG PO TABS: 5.0000 mg | ORAL_TABLET | Freq: Every day | ORAL | Status: DC

## 2014-08-16 NOTE — Progress Notes (Signed)
Physical Therapy Treatment Patient Details Name: Cherisse Hickle MRN: [id number] DOB: 06-25-1961 Today's Date: 08/16/2014    History of Present Illness 53 y.o. female admitted to [facility] on 08/13/14 s/p elective L TKA.  Pt with significant PMHx of DM, HTN, closed head injury, and multiple right knee surgeries (R quad tendon repair).       PT Comments    Patient progressing well with ambulation. Continues slow and gaurded but no safety concerns. Patient is planning to DC later today. If still here this afternoon, therapy will follow up with any questions. Patient safe to D/C from a mobility standpoint based on progression towards goals set on PT eval.    Follow Up Recommendations  Home health PT;Supervision/Assistance - 24 hour     Equipment Recommendations  Hospital bed    Recommendations for Other Services       Precautions / Restrictions Precautions Precautions: Fall;Knee Required Braces or Orthoses: Knee Immobilizer - Left Restrictions Weight Bearing Restrictions: Yes LLE Weight Bearing: Weight bearing as tolerated    Mobility  Bed Mobility Overal bed mobility: Modified Independent                Transfers Overall transfer level: Modified independent                  Ambulation/Gait Ambulation/Gait assistance: Supervision Ambulation Distance (Feet): 200 Feet Assistive device: Rolling walker (2 wheeled) Gait Pattern/deviations: Step-to pattern     General Gait Details: Slow and guarded gait but safe technique.    Stairs            Wheelchair Mobility    Modified Rankin (Stroke Patients Only)       Balance                                    Cognition Arousal/Alertness: Awake/alert Behavior During Therapy: WFL for tasks assessed/performed Overall Cognitive Status: Within Functional Limits for tasks assessed                      Exercises Total Joint Exercises Quad Sets: AROM;Left;10 reps Heel Slides:  AAROM;Left;10 reps Hip ABduction/ADduction: AAROM;Left;10 reps Straight Leg Raises: AROM;Left;10 reps;Supine    General Comments        Pertinent Vitals/Pain      Home Living                      Prior Function            PT Goals (current goals can now be found in the care plan section) Progress towards PT goals: Progressing toward goals    Frequency  7X/week    PT Plan Current plan remains appropriate    Co-evaluation             End of Session Equipment Utilized During Treatment: Left knee immobilizer Activity Tolerance: Patient tolerated treatment well Patient left: in bed;with call bell/phone within reach     Time: 0920-0948 PT Time Calculation (min): 28 min  Charges:  $Gait Training: 8-22 mins $Therapeutic Exercise: 8-22 mins                    G Codes:      Jacqualyn Posey 08/16/2014, 9:55 AM 08/16/2014 Jacqualyn Posey PTA [phone] pager [phone] office

## 2014-08-16 NOTE — Progress Notes (Signed)
ANTICOAGULATION CONSULT NOTE - Follow Up Consult  Pharmacy Consult for Coumadin Indication: VTE prophylaxis  Allergies  Allergen Reactions  . Aleve [Naproxen Sodium] Hives  . Iodine Hives    Certain iodine in fish  . Iohexol      Code: HIVES, Desc: REPORTED HIVES, PT STATES SHE DOES FINE IF SHE HAS A BENADRYL FIRST, ARS 02/09/09   . Percocet [Oxycodone-Acetaminophen] Hives and Itching    Paronoid    Patient Measurements: Height: 5\' 4"  (162.6 cm) Weight: 268 lb (121.564 kg) IBW/kg (Calculated) : 54.7 Heparin Dosing Weight:    Vital Signs: Temp: 99 F (37.2 C) (09/04 0526) Temp src: Oral (09/04 0526) BP: 119/59 mmHg (09/04 0526) Pulse Rate: 83 (09/04 0526)  Labs:  Recent Labs  08/14/14 0605 08/15/14 0733 08/16/14 0427  HGB 9.9* 9.5* 8.7*  HCT 30.5* 28.8* 26.7*  PLT 158 179 172  LABPROT 15.4* 17.9* 17.1*  INR 1.22 1.48 1.39  CREATININE 1.34*  --   --     Estimated Creatinine Clearance: 62.5 ml/min (by C-G formula based on Cr of 1.34).   Assessment: Coumadin for L TKA. BL INR 1.04, Coumadin points =6, Today INR down to 1.39, hgb down to 8.7, pltc stable, no bleeding noted, pt stable per MD   Goal of Therapy:  INR 2-3 Monitor platelets by anticoagulation protocol: Yes   Plan:  Give Coumadin 10 mg x 1 dose today  Daily INR. Consider discharge on Coumadin 10 mg daily with outpatient f/u in 3-4 days    Albertina Parr, PharmD.  Clinical Pharmacist Pager [phone]

## 2014-08-16 NOTE — Progress Notes (Signed)
Physical Therapy Treatment Patient Details Name: Kaitlyn Castillo MRN: [id number] DOB: [birth year] Today's Date: 08/16/2014    History of Present Illness 53 y.o. female admitted to [facility] on 08/13/14 s/p elective L TKA.  Pt with significant PMHx of DM, HTN, closed head injury, and multiple right knee surgeries (R quad tendon repair).       PT Comments    Patient working on ambulation this session. Able to increase ambulation and steadiness of gait. PLanning to DC home later today. Patient safe to D/C from a mobility standpoint based on progression towards goals set on PT eval.    Follow Up Recommendations  Home health PT;Supervision/Assistance - 24 hour     Equipment Recommendations  Hospital bed    Recommendations for Other Services       Precautions / Restrictions Precautions Required Braces or Orthoses: Knee Immobilizer - Left Restrictions LLE Weight Bearing: Weight bearing as tolerated    Mobility  Bed Mobility                  Transfers Overall transfer level: Modified independent                  Ambulation/Gait Ambulation/Gait assistance: Modified independent (Device/Increase time) Ambulation Distance (Feet): 500 Feet Assistive device: Rolling walker (2 wheeled) Gait Pattern/deviations: Step-through pattern;Decreased stride length     General Gait Details: Slow and guarded gait but safe technique.    Stairs         General stair comments: Patient felt safe with practicing steps earlier. Did not want to attempt again.  Wheelchair Mobility    Modified Rankin (Stroke Patients Only)       Balance                                    Cognition Arousal/Alertness: Awake/alert Behavior During Therapy: WFL for tasks assessed/performed Overall Cognitive Status: Within Functional Limits for tasks assessed                      Exercises      General Comments        Pertinent Vitals/Pain Pain Assessment: No/denies  pain    Home Living                      Prior Function            PT Goals (current goals can now be found in the care plan section) Progress towards PT goals: Progressing toward goals    Frequency  7X/week    PT Plan Current plan remains appropriate    Co-evaluation             End of Session Equipment Utilized During Treatment: Left knee immobilizer Activity Tolerance: Patient tolerated treatment well Patient left: in chair;with call bell/phone within reach (ON commode)     Time: 5427-0623 PT Time Calculation (min): 27 min  Charges:  $Gait Training: 23-37 mins                    G Codes:      Jacqualyn Posey 08/16/2014, 2:03 PM  08/16/2014 Jacqualyn Posey PTA [phone] pager [phone] office

## 2014-08-16 NOTE — Progress Notes (Signed)
Subjective: Pt stable - pain ok   Objective: Vital signs in last 24 hours: Temp:  [98.4 F (36.9 C)-99 F (37.2 C)] 99 F (37.2 C) (09/04 0526) Pulse Rate:  [66-83] 83 (09/04 0526) Resp:  [16-18] 18 (09/04 0322) BP: (95-119)/(38-59) 119/59 mmHg (09/04 0526) SpO2:  [96 %-100 %] 99 % (09/04 0526)  Intake/Output from previous day: 09/03 0701 - 09/04 0700 In: 760 [P.O.:760] Out: -  Intake/Output this shift: Total I/O In: 240 [P.O.:240] Out: -   Exam:  Intact pulses distally Dorsiflexion/Plantar flexion intact No cellulitis present  Labs:  Recent Labs  08/14/14 0605 08/15/14 0733 08/16/14 0427  HGB 9.9* 9.5* 8.7*    Recent Labs  08/15/14 0733 08/16/14 0427  WBC 7.8 7.6  RBC 3.61* 3.35*  HCT 28.8* 26.7*  PLT 179 172    Recent Labs  08/14/14 0605  NA 138  K 4.6  CL 105  CO2 26  BUN 22  CREATININE 1.34*  GLUCOSE 162*  CALCIUM 8.0*    Recent Labs  08/15/14 0733 08/16/14 0427  INR 1.48 1.39    Assessment/Plan: Plan dc today - mobilizing well   Kaitlyn Castillo 08/16/2014, 11:49 AM

## 2014-08-16 NOTE — Progress Notes (Signed)
PT Cancellation Note  Patient Details Name: Kaitlyn Castillo MRN: [id number] DOB: 07-10-1961   Cancelled Treatment:    Reason Eval/Treat Not Completed: Pain limiting ability to participate. Patient requesting pain meds at this time. RN made aware. Will follow up later this morning.    Jacqualyn Posey 08/16/2014, 8:01 AM

## 2014-08-27 NOTE — Discharge Summary (Signed)
Physician Discharge Summary  Patient ID: Carlie Monie MRN: [id number] DOB/AGE: 1961-05-12 53 y.o.  Admit date: 08/13/2014 Discharge date:08/16/2014 Admission Diagnoses:  Active Problems:   Arthritis of knee   Discharge Diagnoses:  Same  Surgeries: Procedure(s): TOTAL KNEE ARTHROPLASTY on 08/13/2014   Consultants:    Discharged Condition: Stable  Hospital Course: Kimiya Kliewer is an 53 y.o. female who was admitted 08/13/2014 with a chief complaint of knee pain, and found to have a diagnosis of knee arthritis.  They were brought to the operating room on 08/13/2014 and underwent the above named procedures.  She made good progress in PT and was dced on POD 3 in good condition.  Antibiotics given:  Anti-infectives   Start     Dose/Rate Route Frequency Ordered Stop   08/13/14 1800  vancomycin (VANCOCIN) IVPB 1000 mg/200 mL premix     1,000 mg 200 mL/hr over 60 Minutes Intravenous Every 12 hours 08/13/14 1332 08/13/14 1800   08/13/14 0723  vancomycin (VANCOCIN) 1 GM/200ML IVPB    Comments:  [facility]   : cabinet override      08/13/14 0723 08/13/14 0850   08/13/14 0600  ceFAZolin (ANCEF) 3 g in dextrose 5 % 50 mL IVPB     3 g 160 mL/hr over 30 Minutes Intravenous On call to O.R. 08/12/14 1415 08/13/14 0750    .  Recent vital signs:  Filed Vitals:   08/16/14 1425  BP: 128/69  Pulse: 89  Temp: 97.1 F (36.2 C)  Resp: 18    Recent laboratory studies:  Results for orders placed during the hospital encounter of 08/13/14  GLUCOSE, CAPILLARY      Result Value Ref Range   Glucose-Capillary 138 (*) 70 - 99 mg/dL  GLUCOSE, CAPILLARY      Result Value Ref Range   Glucose-Capillary 124 (*) 70 - 99 mg/dL  PROTIME-INR      Result Value Ref Range   Prothrombin Time 15.4 (*) 11.6 - 15.2 seconds   INR 1.22  0.00 - 1.49  CBC      Result Value Ref Range   WBC 5.5  4.0 - 10.5 K/uL   RBC 3.71 (*) 3.87 - 5.11 MIL/uL   Hemoglobin 9.9 (*) 12.0 - 15.0 g/dL   HCT 30.5 (*) 36.0 -  46.0 %   MCV 82.2  78.0 - 100.0 fL   MCH 26.7  26.0 - 34.0 pg   MCHC 32.5  30.0 - 36.0 g/dL   RDW 15.2  11.5 - 15.5 %   Platelets 158  150 - 400 K/uL  BASIC METABOLIC PANEL      Result Value Ref Range   Sodium 138  137 - 147 mEq/L   Potassium 4.6  3.7 - 5.3 mEq/L   Chloride 105  96 - 112 mEq/L   CO2 26  19 - 32 mEq/L   Glucose, Bld 162 (*) 70 - 99 mg/dL   BUN 22  6 - 23 mg/dL   Creatinine, Ser 1.34 (*) 0.50 - 1.10 mg/dL   Calcium 8.0 (*) 8.4 - 10.5 mg/dL   GFR calc non Af Amer 44 (*) >90 mL/min   GFR calc Af Amer 51 (*) >90 mL/min   Anion gap 7  5 - 15  GLUCOSE, CAPILLARY      Result Value Ref Range   Glucose-Capillary 164 (*) 70 - 99 mg/dL  GLUCOSE, CAPILLARY      Result Value Ref Range   Glucose-Capillary 158 (*) 70 - 99 mg/dL  Comment 1 Notify RN    GLUCOSE, CAPILLARY      Result Value Ref Range   Glucose-Capillary 159 (*) 70 - 99 mg/dL   Comment 1 Notify RN    GLUCOSE, CAPILLARY      Result Value Ref Range   Glucose-Capillary 230 (*) 70 - 99 mg/dL   Comment 1 Documented in Chart     Comment 2 Notify RN    GLUCOSE, CAPILLARY      Result Value Ref Range   Glucose-Capillary 204 (*) 70 - 99 mg/dL   Comment 1 Documented in Chart     Comment 2 Notify RN    PROTIME-INR      Result Value Ref Range   Prothrombin Time 17.9 (*) 11.6 - 15.2 seconds   INR 1.48  0.00 - 1.49  CBC      Result Value Ref Range   WBC 7.8  4.0 - 10.5 K/uL   RBC 3.61 (*) 3.87 - 5.11 MIL/uL   Hemoglobin 9.5 (*) 12.0 - 15.0 g/dL   HCT 28.8 (*) 36.0 - 46.0 %   MCV 79.8  78.0 - 100.0 fL   MCH 26.3  26.0 - 34.0 pg   MCHC 33.0  30.0 - 36.0 g/dL   RDW 14.9  11.5 - 15.5 %   Platelets 179  150 - 400 K/uL  GLUCOSE, CAPILLARY      Result Value Ref Range   Glucose-Capillary 144 (*) 70 - 99 mg/dL   Comment 1 Notify RN    GLUCOSE, CAPILLARY      Result Value Ref Range   Glucose-Capillary 154 (*) 70 - 99 mg/dL   Comment 1 Notify RN    GLUCOSE, CAPILLARY      Result Value Ref Range    Glucose-Capillary 195 (*) 70 - 99 mg/dL   Comment 1 Documented in Chart     Comment 2 Notify RN    PROTIME-INR      Result Value Ref Range   Prothrombin Time 17.1 (*) 11.6 - 15.2 seconds   INR 1.39  0.00 - 1.49  CBC      Result Value Ref Range   WBC 7.6  4.0 - 10.5 K/uL   RBC 3.35 (*) 3.87 - 5.11 MIL/uL   Hemoglobin 8.7 (*) 12.0 - 15.0 g/dL   HCT 26.7 (*) 36.0 - 46.0 %   MCV 79.7  78.0 - 100.0 fL   MCH 26.0  26.0 - 34.0 pg   MCHC 32.6  30.0 - 36.0 g/dL   RDW 14.7  11.5 - 15.5 %   Platelets 172  150 - 400 K/uL  GLUCOSE, CAPILLARY      Result Value Ref Range   Glucose-Capillary 198 (*) 70 - 99 mg/dL  GLUCOSE, CAPILLARY      Result Value Ref Range   Glucose-Capillary 192 (*) 70 - 99 mg/dL   Comment 1 Notify RN    GLUCOSE, CAPILLARY      Result Value Ref Range   Glucose-Capillary 158 (*) 70 - 99 mg/dL   Comment 1 Notify RN    GLUCOSE, CAPILLARY      Result Value Ref Range   Glucose-Capillary 142 (*) 70 - 99 mg/dL   Comment 1 Documented in Chart     Comment 2 Notify RN      Discharge Medications:     Medication List         acetaminophen-codeine 300-30 MG per tablet  Commonly known as:  TYLENOL #3  Take  1-2 tablets by mouth every 6 (six) hours as needed for pain.     acetaminophen-codeine 300-30 MG per tablet  Commonly known as:  TYLENOL #3  Take 1 tablet by mouth every 4 (four) hours as needed for moderate pain.     atenolol 25 MG tablet  Commonly known as:  TENORMIN  Take 25 mg by mouth daily.     BYETTA 10 MCG PEN 10 MCG/0.04ML Sopn injection  Generic drug:  exenatide  Inject 10 mcg into the skin 2 (two) times daily with a meal.     diclofenac sodium 1 % Gel  Commonly known as:  VOLTAREN  Apply 2 g topically 4 (four) times daily as needed (pain).     DSS 100 MG Caps  Take 100 mg by mouth 2 (two) times daily.     glipiZIDE-metformin 5-500 MG per tablet  Commonly known as:  METAGLIP  Take 1 tablet by mouth every evening.      lisinopril-hydrochlorothiazide 20-25 MG per tablet  Commonly known as:  PRINZIDE,ZESTORETIC  Take 2 tablets by mouth daily.     phentermine 37.5 MG capsule  Take 37.5 mg by mouth every morning.     psyllium 58.6 % powder  Commonly known as:  METAMUCIL  Take 1 packet by mouth daily.     warfarin 5 MG tablet  Commonly known as:  COUMADIN  Take 1 tablet (5 mg total) by mouth daily.     warfarin 10 MG tablet  Commonly known as:  COUMADIN  Take 1 tablet (10 mg total) by mouth one time only at 6 PM.        Diagnostic Studies: Dg Chest 2 View  08/05/2014   CLINICAL DATA:  53 year old female preoperative study for left knee surgery. Hypertension and diabetes. Initial encounter.  EXAM: CHEST  2 VIEW  COMPARISON:  06/07/2011 and earlier.  FINDINGS: Stable lung volumes. Stable cardiac size at the upper limits of normal. Other mediastinal contours are within normal limits. Visualized tracheal air column is within normal limits. The lungs are clear. No pneumothorax or effusion. No acute osseous abnormality identified.  IMPRESSION: No acute cardiopulmonary abnormality.   Electronically Signed   By: Lars Pinks M.D.   On: 08/05/2014 13:34    Disposition: 01-Home or Self Care      Discharge Instructions   Call MD / Call 911    Complete by:  As directed   If you experience chest pain or shortness of breath, CALL 911 and be transported to the hospital emergency room.  If you develope a fever above 101 F, pus (white drainage) or increased drainage or redness at the wound, or calf pain, call your [facility] office.     Constipation Prevention    Complete by:  As directed   Drink plenty of fluids.  Prune juice may be helpful.  You may use a stool softener, such as Colace (over the counter) 100 mg twice a day.  Use MiraLax (over the counter) for constipation as needed.     Diet - low sodium heart healthy    Complete by:  As directed      Discharge instructions    Complete by:  As directed   Keep  incision dry CPM 1 hour 3 times a day minimum - increase daily Take 10 mg coumadin for 3 days then 5 mg coumadin daily     Increase activity slowly as tolerated    Complete by:  As directed  Weight bearing as tolerated    Complete by:  As directed            Follow-up Information   Follow up with [facility]. (Someone from [facility] will contact you concerning start date and time for Nursing and physical therapy.)    Contact information:   [address] [place] [id number] [phone]        Signed: Meredith Pel 08/27/2014, 9:57 PM

## 2014-09-05 ENCOUNTER — Ambulatory Visit: Payer: Medicaid Other | Attending: Orthopedic Surgery | Admitting: Physical Therapy

## 2014-09-05 DIAGNOSIS — IMO0001 Reserved for inherently not codable concepts without codable children: Secondary | ICD-10-CM | POA: Insufficient documentation

## 2014-09-05 DIAGNOSIS — M25569 Pain in unspecified knee: Secondary | ICD-10-CM | POA: Diagnosis not present

## 2014-09-17 ENCOUNTER — Ambulatory Visit: Payer: Medicaid Other | Admitting: Rehabilitation

## 2014-09-25 ENCOUNTER — Ambulatory Visit: Payer: Medicaid Other | Attending: Orthopedic Surgery | Admitting: Rehabilitation

## 2014-09-25 DIAGNOSIS — Z5189 Encounter for other specified aftercare: Secondary | ICD-10-CM | POA: Diagnosis present

## 2014-09-25 DIAGNOSIS — M25562 Pain in left knee: Secondary | ICD-10-CM | POA: Diagnosis not present

## 2014-10-03 ENCOUNTER — Ambulatory Visit: Payer: Medicaid Other | Admitting: Physical Therapy

## 2014-10-03 DIAGNOSIS — Z5189 Encounter for other specified aftercare: Secondary | ICD-10-CM | POA: Diagnosis not present

## 2014-10-09 ENCOUNTER — Ambulatory Visit: Payer: Medicaid Other | Admitting: Rehabilitation

## 2014-10-09 DIAGNOSIS — Z5189 Encounter for other specified aftercare: Secondary | ICD-10-CM | POA: Diagnosis not present

## 2014-10-14 ENCOUNTER — Encounter (HOSPITAL_COMMUNITY): Payer: Self-pay | Admitting: Orthopedic Surgery

## 2014-10-16 ENCOUNTER — Ambulatory Visit: Payer: Medicaid Other | Attending: Orthopedic Surgery | Admitting: Physical Therapy

## 2014-10-16 DIAGNOSIS — M25562 Pain in left knee: Secondary | ICD-10-CM | POA: Insufficient documentation

## 2014-10-16 DIAGNOSIS — Z5189 Encounter for other specified aftercare: Secondary | ICD-10-CM | POA: Diagnosis present

## 2014-10-16 DIAGNOSIS — Z96652 Presence of left artificial knee joint: Secondary | ICD-10-CM

## 2014-10-16 NOTE — Therapy (Signed)
Physical Therapy Treatment  Patient Details  Name: Kaitlyn Castillo MRN: [id number] Date of Birth: 1961-01-20  Encounter Date: 10/16/2014      PT End of Session - 10/16/14 1351    Visit Number 5   Number of Visits 5   PT Start Time 4656   PT Stop Time 1120   PT Time Calculation (min) 40 min      Past Medical History  Diagnosis Date  . Knee pain, right   . Diabetes mellitus   . Hypertension   . Obese   . High cholesterol   . Head injury, closed, with brief LOC   . Shortness of breath     with too much fluid- not currently  . Constipation   . Arthritis     Past Surgical History  Procedure Laterality Date  . Knee surgery      quad  . Quadriceps repair    . Cesarean section    . Colonoscopy    . Carbuncle      back of head  . Total knee arthroplasty Left 08/13/2014    Procedure: TOTAL KNEE ARTHROPLASTY;  Tkach: Meredith Pel, MD;  Location: [facility];  Service: Orthopedics;  Laterality: Left;    LMP 11/11/2012  Visit Diagnosis:  Status post total left knee replacement        [facility] PT Assessment - 10/16/14 1400    AROM   Left Knee Extension 3   Left Knee Flexion 105   Strength   Left Knee Flexion 5/5   Left Knee Extension 5/5          Adult PT Treatment/Exercise - 10/16/14 0700    Knee/Hip Exercises: Stretches   Knee: Self-Stretch to increase Flexion --  10 reps, 10 seconds seated   Knee/Hip Exercises: Aerobic   Stationary Bike 5 min   Knee/Hip Exercises: Standing   Lateral Step Up 10 reps;Left   Forward Step Up 10 reps;Hand Hold: 1   Wall Squat 10 reps   Other Standing Knee Exercises 3 way pillowcase slide 10 each stand by   Knee/Hip Exercises: Supine   Quad Sets Left;1 set   Heel Slides Left;AAROM;1 set   Patellar Mobs checked non tight   Knee/Hip Exercises: Prone   Hamstring Curl 10 reps   Hamstring Curl Limitations assisted flexion 10 times   Other Prone Exercises prone quad set left 10 reps    Modalities   Modalities Cryotherapy   Manual Therapy   Manual Therapy Edema management   Manual Therapy   Edema Management --          Education - 10/16/14 1351    Education provided Yes   Education Details Patient   Methods Handout   Comprehension Verbalized understanding          PT Short Term Goals - 10/16/14 1359    PT SHORT TERM GOAL #1   Period Weeks   Status Achieved          PT Long Term Goals - 10/16/14 1327    PT LONG TERM GOAL #1   Title Patient will be able to ambulate short community distances with or without on level and unlevel surfaces needed to return to work   Time 6   Period Weeks   Status Achieved   PT LONG TERM GOAL #2   Title Independent with advanced home program   Time 6   Period Weeks   Status Achieved   PT LONG TERM GOAL #3   Title  Left knee flexion to 110 degrees needed for greater ease with going up and down stairs and curbs   Time 6   Period --   Status Not Met   PT LONG TERM GOAL #4   Title Patient will have minimal pain 2-3/10 with the majority of househols chores, caring for her youngest son   Time 6   Period Weeks   Status Achieved   PT LONG TERM GOAL #5   Title Quad and HS strength 4/5 needed for work and household duties   Time 6   Period Weeks   Status Achieved          Plan - 10/16/14 1321    Clinical Impression Statement  LTG# 1 met,   PT Plan D/C to home program. Patient requests D/C  Info to [facility] program for possible PT.        Problem List Patient Active Problem List   Diagnosis Date Noted  . Arthritis of knee 08/13/2014  . Pain in lower limb 04/24/2014  . Onychomycosis 11/02/2013  . Pain, foot 11/02/2013  . Ulcer of other part of foot 11/02/2013  . Unspecified venous (peripheral) insufficiency 09/13/2012  . Cellulitis of left leg 07/07/2012  . ARF (acute renal failure) 07/07/2012  . LEG PAIN, LEFT 01/10/2008  . ANXIETY 11/19/2007  . DIABETIC FOOT ULCER, TOE 11/17/2007  . CHEST PAIN UNSPECIFIED 11/01/2007  . DIABETES MELLITUS,  TYPE II 07/29/2007  . DEPRESSION 07/29/2007  . HYPERTENSION 07/29/2007                                            HARRIS,KAREN 10/16/2014, 2:19 PM

## 2014-10-17 ENCOUNTER — Encounter: Payer: Self-pay | Admitting: Physical Therapy

## 2014-10-17 NOTE — Therapy (Signed)
  Patient Details  Name: [name] MRN: [id number] Date of Birth: 1961/02/14  Encounter Date: 10/17/2014 PHYSICAL THERAPY DISCHARGE SUMMARY  Visits from Start of Care: 5  Current functional level related to goals / functional outcomes: PT Long Term Goals - 10/16/14 1327    PT LONG TERM GOAL #1   Title Patient will be able to ambulate short community distances with or without on level and unlevel surfaces needed to return to work   Time 6   Period Weeks   Status Achieved   PT LONG TERM GOAL #2   Title Independent with advanced home program   Time 6   Period Weeks   Status Achieved   PT LONG TERM GOAL #3   Title Left knee flexion to 110 degrees needed for greater ease with going up and down stairs and curbs   Time 6   Period --   Status Not Met   PT LONG TERM GOAL #4   Title Patient will have minimal pain 2-3/10 with the majority of househols chores, caring for her youngest son   Time 6   Period Weeks   Status Achieved   PT LONG TERM GOAL #5   Title Quad and HS strength 4/5 needed for work and household duties   Time 6   Period Weeks   Status Achieved      Remaining deficits: AROM    Left Knee Extension 3   Left Knee Flexion 105   Strength   Left Knee Flexion 5/5   Left Knee Extension 5/5      Education / Equipment:  Plan: Patient agrees to discharge.  Patient goals were partially met. Patient is being discharged due to financial reasons.  Due to changes in the Medicaid policy for rehab as of May 13, 2013, the patient has limited visits with her qualifying diagnosis.  The patient is unable to pay out of pocket expenses at this time and therefore she plans on continuing with her [facility] for further gains in ROM and strength.        Ruben Im C 10/17/2014, 11:47 AM

## 2014-10-22 ENCOUNTER — Ambulatory Visit: Payer: Medicaid Other | Admitting: Physical Therapy

## 2014-11-12 ENCOUNTER — Ambulatory Visit: Payer: Medicaid Other | Admitting: Podiatry

## 2014-11-13 ENCOUNTER — Ambulatory Visit: Payer: Medicaid Other | Admitting: Podiatry

## 2014-11-22 ENCOUNTER — Encounter: Payer: Self-pay | Admitting: Podiatry

## 2014-11-22 ENCOUNTER — Ambulatory Visit (INDEPENDENT_AMBULATORY_CARE_PROVIDER_SITE_OTHER): Payer: Medicaid Other | Admitting: Podiatry

## 2014-11-22 VITALS — BP 153/84 | HR 79

## 2014-11-22 DIAGNOSIS — B351 Tinea unguium: Secondary | ICD-10-CM

## 2014-11-22 DIAGNOSIS — M79606 Pain in leg, unspecified: Secondary | ICD-10-CM

## 2014-11-22 NOTE — Progress Notes (Signed)
Subjective:  53 year old diabetic female presents requesting toe nails and calluses trimmed. Has had knee replacement and a death in family. Been out of water exercise too long.   Objective: Thick discolored toe nails x 10.  All pedal pulses are palpable.  Contracted lesser digits with distal clavi 2nd and 3rd bilateral.  Dorsal bunion on left foot.  All epicritic and tactile sensations grossly intact.   Assessment: Diabetes under control.  Onychomycosis x 10.  Contracted digits and bunion bilateral.  History of digital ulcer.   Plan: Reviewed findings.  Debrided all nails and corns.  Continue current level of care.  Return in 3 months.

## 2014-11-22 NOTE — Patient Instructions (Signed)
Seen for hypertrophic nails. All nails debrided. Return in 3 months or as needed.  

## 2015-01-29 ENCOUNTER — Ambulatory Visit: Payer: Medicaid Other | Admitting: Podiatry

## 2015-02-24 ENCOUNTER — Ambulatory Visit: Payer: Medicaid Other | Admitting: Podiatry

## 2015-03-11 ENCOUNTER — Ambulatory Visit (INDEPENDENT_AMBULATORY_CARE_PROVIDER_SITE_OTHER): Payer: Medicaid Other | Admitting: Ophthalmology

## 2015-03-25 ENCOUNTER — Ambulatory Visit (INDEPENDENT_AMBULATORY_CARE_PROVIDER_SITE_OTHER): Payer: Medicaid Other | Admitting: Ophthalmology

## 2015-03-25 DIAGNOSIS — E11351 Type 2 diabetes mellitus with proliferative diabetic retinopathy with macular edema: Secondary | ICD-10-CM | POA: Diagnosis not present

## 2015-03-25 DIAGNOSIS — H35033 Hypertensive retinopathy, bilateral: Secondary | ICD-10-CM | POA: Diagnosis not present

## 2015-03-25 DIAGNOSIS — E11359 Type 2 diabetes mellitus with proliferative diabetic retinopathy without macular edema: Secondary | ICD-10-CM | POA: Diagnosis not present

## 2015-03-25 DIAGNOSIS — E11311 Type 2 diabetes mellitus with unspecified diabetic retinopathy with macular edema: Secondary | ICD-10-CM

## 2015-03-25 DIAGNOSIS — H43813 Vitreous degeneration, bilateral: Secondary | ICD-10-CM | POA: Diagnosis not present

## 2015-03-25 DIAGNOSIS — I10 Essential (primary) hypertension: Secondary | ICD-10-CM | POA: Diagnosis not present

## 2015-04-02 ENCOUNTER — Encounter: Payer: Self-pay | Admitting: Podiatry

## 2015-04-02 ENCOUNTER — Ambulatory Visit (INDEPENDENT_AMBULATORY_CARE_PROVIDER_SITE_OTHER): Payer: Medicaid Other | Admitting: Podiatry

## 2015-04-02 VITALS — BP 142/81 | HR 80

## 2015-04-02 DIAGNOSIS — M79673 Pain in unspecified foot: Secondary | ICD-10-CM

## 2015-04-02 DIAGNOSIS — M659 Synovitis and tenosynovitis, unspecified: Secondary | ICD-10-CM

## 2015-04-02 DIAGNOSIS — M76829 Posterior tibial tendinitis, unspecified leg: Secondary | ICD-10-CM

## 2015-04-02 DIAGNOSIS — B351 Tinea unguium: Secondary | ICD-10-CM

## 2015-04-02 DIAGNOSIS — M79606 Pain in leg, unspecified: Secondary | ICD-10-CM

## 2015-04-02 DIAGNOSIS — M216X9 Other acquired deformities of unspecified foot: Secondary | ICD-10-CM

## 2015-04-02 NOTE — Progress Notes (Signed)
Subjective:  54 year old diabetic female presents complaining of pain on left foot lateral side and posterior ankle area for a couple of weeks. Also requesting toe nails and calluses trimmed. Her blood sugar has been running over 250. Patient noted bulging out vein on right leg and will go have it checked out.  Objective: Thick discolored toe nails x 10.  All pedal pulses are palpable.  Contracted lesser digits with ulcerating distal clavi 2nd left and 3rd right. Dorsal bunion on left foot.  All epicritic and tactile sensations grossly intact.  Pain with ambulation at dorsolateral left foot. Severe elevated first ray left with collapsing mid arch left.  Severe tight Achilles tendon bilateral.  Assessment: Diabetes uncontrolled. Onychomycosis x 10.  Contracted digits and bunion bilateral.  Digital ulcer 2nd left. Hyperpronation left with collapsing arch and pain in lateral column.  Charcot joint left.  Plan: Reviewed findings.  Debrided all nails . Debrided ulcerating callus 2nd left. Left foot placed in Ankle brace to reduce excess pronation.   Hard shell OTC orthotic dispensed to place under the soft shoe liner. Return in one month.

## 2015-04-02 NOTE — Patient Instructions (Signed)
Seen for pain in left foot. Debrided all nails.  Ankle brace dispensed for left foot.  OTC hard shell orthotics dispensed to place under soft shoe liners.  Return in one months.

## 2015-04-16 ENCOUNTER — Other Ambulatory Visit: Payer: Self-pay

## 2015-04-16 DIAGNOSIS — Z1231 Encounter for screening mammogram for malignant neoplasm of breast: Secondary | ICD-10-CM

## 2015-04-23 ENCOUNTER — Other Ambulatory Visit (INDEPENDENT_AMBULATORY_CARE_PROVIDER_SITE_OTHER): Payer: Medicaid Other | Admitting: Ophthalmology

## 2015-04-23 DIAGNOSIS — E11351 Type 2 diabetes mellitus with proliferative diabetic retinopathy with macular edema: Secondary | ICD-10-CM | POA: Diagnosis not present

## 2015-04-23 DIAGNOSIS — E11311 Type 2 diabetes mellitus with unspecified diabetic retinopathy with macular edema: Secondary | ICD-10-CM | POA: Diagnosis not present

## 2015-04-28 ENCOUNTER — Ambulatory Visit: Payer: Medicaid Other | Admitting: Podiatry

## 2015-05-02 ENCOUNTER — Encounter: Payer: Self-pay | Admitting: Podiatry

## 2015-05-02 ENCOUNTER — Ambulatory Visit: Payer: Medicaid Other | Admitting: Podiatry

## 2015-05-02 ENCOUNTER — Ambulatory Visit (INDEPENDENT_AMBULATORY_CARE_PROVIDER_SITE_OTHER): Payer: Medicaid Other | Admitting: Podiatry

## 2015-05-02 VITALS — BP 170/85 | HR 77 | Ht 64.0 in | Wt 280.0 lb

## 2015-05-02 DIAGNOSIS — L97521 Non-pressure chronic ulcer of other part of left foot limited to breakdown of skin: Secondary | ICD-10-CM

## 2015-05-02 DIAGNOSIS — L97529 Non-pressure chronic ulcer of other part of left foot with unspecified severity: Secondary | ICD-10-CM | POA: Insufficient documentation

## 2015-05-02 DIAGNOSIS — M79672 Pain in left foot: Secondary | ICD-10-CM | POA: Diagnosis not present

## 2015-05-02 DIAGNOSIS — M2060 Acquired deformities of toe(s), unspecified, unspecified foot: Secondary | ICD-10-CM

## 2015-05-02 NOTE — Progress Notes (Signed)
54 year old female presents stating she is under a lots of stress. Wants to know if she needs toe cover to stop the toe breaking down.  Objective: Thick dystrophic nails x 10. Digital corn distal end 2nd bilateral L>R. All pedal pulses are palpable. Contracted both first and 2nd toe bilateral.  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 Dystrophic nails x 10.  Plan: Debrided all nails and calluses. May benefit from Flexor tendon release on 2nd toes.

## 2015-05-02 NOTE — Patient Instructions (Signed)
All nails and digital corns debrided. May benefit from Flexor tenotomy on 2nd toe left, office surgery 45 min.

## 2015-05-16 ENCOUNTER — Ambulatory Visit: Payer: Medicaid Other

## 2015-05-23 ENCOUNTER — Ambulatory Visit
Admission: RE | Admit: 2015-05-23 | Discharge: 2015-05-23 | Disposition: A | Discharge status: Home / Self Care | Payer: Medicaid Other | Source: Ambulatory Visit

## 2015-05-23 DIAGNOSIS — Z1231 Encounter for screening mammogram for malignant neoplasm of breast: Secondary | ICD-10-CM

## 2015-05-28 ENCOUNTER — Encounter: Payer: Medicaid Other | Admitting: Podiatry

## 2015-06-03 ENCOUNTER — Encounter: Payer: Medicaid Other | Admitting: Podiatry

## 2015-08-25 ENCOUNTER — Encounter (HOSPITAL_COMMUNITY): Payer: Self-pay | Admitting: *Deleted

## 2015-08-25 ENCOUNTER — Inpatient Hospital Stay (HOSPITAL_COMMUNITY)
Admission: AD | Admit: 2015-08-25 | Discharge: 2015-08-25 | Disposition: A | Discharge status: Home / Self Care | Payer: Medicaid Other | Source: Ambulatory Visit | Attending: Family Medicine | Admitting: Family Medicine

## 2015-08-25 DIAGNOSIS — E669 Obesity, unspecified: Secondary | ICD-10-CM | POA: Insufficient documentation

## 2015-08-25 DIAGNOSIS — Z96652 Presence of left artificial knee joint: Secondary | ICD-10-CM | POA: Diagnosis not present

## 2015-08-25 DIAGNOSIS — I10 Essential (primary) hypertension: Secondary | ICD-10-CM | POA: Insufficient documentation

## 2015-08-25 DIAGNOSIS — M1990 Unspecified osteoarthritis, unspecified site: Secondary | ICD-10-CM | POA: Insufficient documentation

## 2015-08-25 DIAGNOSIS — N946 Dysmenorrhea, unspecified: Secondary | ICD-10-CM | POA: Diagnosis not present

## 2015-08-25 DIAGNOSIS — N950 Postmenopausal bleeding: Secondary | ICD-10-CM | POA: Insufficient documentation

## 2015-08-25 DIAGNOSIS — E119 Type 2 diabetes mellitus without complications: Secondary | ICD-10-CM | POA: Insufficient documentation

## 2015-08-25 DIAGNOSIS — E780 Pure hypercholesterolemia: Secondary | ICD-10-CM | POA: Insufficient documentation

## 2015-08-25 LAB — URINALYSIS, ROUTINE W REFLEX MICROSCOPIC
BILIRUBIN URINE: NEGATIVE
Glucose, UA: NEGATIVE mg/dL
Ketones, ur: 15 mg/dL — AB
Leukocytes, UA: NEGATIVE
NITRITE: NEGATIVE
Protein, ur: NEGATIVE mg/dL
SPECIFIC GRAVITY, URINE: 1.025 (ref 1.005–1.030)
Urobilinogen, UA: 0.2 mg/dL (ref 0.0–1.0)
pH: 5.5 (ref 5.0–8.0)

## 2015-08-25 LAB — CBC
HCT: 34.5 % — ABNORMAL LOW (ref 36.0–46.0)
Hemoglobin: 11.2 g/dL — ABNORMAL LOW (ref 12.0–15.0)
MCH: 26.2 pg (ref 26.0–34.0)
MCHC: 32.5 g/dL (ref 30.0–36.0)
MCV: 80.6 fL (ref 78.0–100.0)
Platelets: 236 K/uL (ref 150–400)
RBC: 4.28 MIL/uL (ref 3.87–5.11)
RDW: 16.1 % — ABNORMAL HIGH (ref 11.5–15.5)
WBC: 4.6 K/uL (ref 4.0–10.5)

## 2015-08-25 LAB — URINE MICROSCOPIC-ADD ON

## 2015-08-25 MED ORDER — ACETAMINOPHEN-CODEINE #3 300-30 MG PO TABS
2.0000 | ORAL_TABLET | Freq: Once | ORAL | Status: AC
  Administered 2015-08-25: 2 via ORAL
  Filled 2015-08-25: qty 2

## 2015-08-25 MED ORDER — ACETAMINOPHEN-CODEINE #3 300-30 MG PO TABS: 1.0000 | ORAL_TABLET | Freq: Four times a day (QID) | ORAL | Status: DC | PRN

## 2015-08-25 MED ORDER — MEGESTROL ACETATE 40 MG PO TABS: 40.0000 mg | ORAL_TABLET | Freq: Two times a day (BID) | ORAL | Status: DC

## 2015-08-25 NOTE — MAU Note (Addendum)
NO CYCLE  FOR  6  YEARS  BUT  HAS  OCC  SPOTTING   .  SAYS    2 YEARS AGO   SHE  WENT  TO [place]-  ALL GOOD - IF  CHANGES   COME  IN.       SPOTTING  STARTED  AGAIN -  SO IN July   SHE  WENT  TO SEE  DR [place]   TO [facility]-.     SAYS BLEEDING  STARTED  HEAVY  ON Friday.  -  WITH  BAD  CRAMPING-  NO MEDS.     ON ARRIVAL  SMALL AMT  RED SMEARS ON PAD.     SAYS  HAD POSITIVE  [facility]- WHEN SWABBED NOSE LAST YEAR  BEFORE  SURGERY-  NEVER  HAD A  SORE.

## 2015-08-25 NOTE — MAU Provider Note (Signed)
History     CSN: [id number]  Arrival date and time: 08/25/15 2012   First Provider Initiated Contact with Patient 08/25/15 2047      No chief complaint on file.  HPI Ms. Kaitlyn Castillo is a 54 y.o. [id number] who presents to MAU today with complaint of vaginal bleeding x 3-4 days. The patient states that it had been > 1 year post menopausal. She denies any previous episodes of bleeding. She states that she has had moderate lower abdominal pain associated with the bleeding. She has not taken anything for pain. She endorses some fatigue, but denies weakness or dizziness.    OB History    Gravida Para Term Preterm AB TAB SAB Ectopic Multiple Living   8 4 4  4 4    4       Past Medical History  Diagnosis Date  . Knee pain, right   . Diabetes mellitus   . Hypertension   . Obese   . High cholesterol   . Head injury, closed, with brief LOC   . Shortness of breath     with too much fluid- not currently  . Constipation   . Arthritis     Past Surgical History  Procedure Laterality Date  . Knee surgery      quad  . Quadriceps repair    . Cesarean section    . Colonoscopy    . Carbuncle      back of head  . Total knee arthroplasty Left 08/13/2014    Procedure: TOTAL KNEE ARTHROPLASTY;  [place]: Meredith Pel, MD;  Location: [place];  Service: Orthopedics;  Laterality: Left;    Family History  Problem Relation Age of Onset  . Diabetes Mother   . Hyperlipidemia Mother   . Hypertension Mother   . Diabetes Father   . Hyperlipidemia Father   . Hypertension Father   . Cancer Brother   . Heart disease Brother   . Hypertension Brother   . Heart attack Brother     Social History  Substance Use Topics  . Smoking status: Former Smoker -- 0 years    Types: Cigarettes    Quit date: 12/13/1978  . Smokeless tobacco: Never Used  . Alcohol Use: No    Allergies:  Allergies  Allergen Reactions  . Aleve [Naproxen Sodium] Hives  . Iodine Hives    Certain iodine in fish  .  Iohexol      Code: HIVES, Desc: REPORTED HIVES, PT STATES SHE DOES FINE IF SHE HAS A BENADRYL FIRST, ARS 02/09/09   . Percocet [Oxycodone-Acetaminophen] Hives and Itching    Paronoid    Prescriptions prior to admission  Medication Sig Dispense Refill Last Dose  . exenatide (BYETTA 10 MCG PEN) 10 MCG/0.04ML SOPN injection Inject 10 mcg into the skin 2 (two) times daily with a meal.   08/25/2015 at Unknown time  . glipiZIDE-metformin (METAGLIP) 5-500 MG per tablet Take 1 tablet by mouth every evening.    08/25/2015 at Unknown time  . lisinopril-hydrochlorothiazide (PRINZIDE,ZESTORETIC) 20-25 MG per tablet Take 2 tablets by mouth daily.    08/25/2015 at Unknown time  . acetaminophen-codeine (TYLENOL #3) 300-30 MG per tablet Take 1-2 tablets by mouth every 6 (six) hours as needed for pain. 15 tablet 0 Taking  . acetaminophen-codeine (TYLENOL #3) 300-30 MG per tablet Take 1 tablet by mouth every 4 (four) hours as needed for moderate pain. 60 tablet 0 Taking  . atenolol (TENORMIN) 25 MG tablet Take 25 mg by  mouth daily.   More than a month at Unknown time  . diclofenac sodium (VOLTAREN) 1 % GEL Apply 2 g topically 4 (four) times daily as needed (pain).   Taking  . docusate sodium 100 MG CAPS Take 100 mg by mouth 2 (two) times daily. 10 capsule 0 Taking  . phentermine 37.5 MG capsule Take 37.5 mg by mouth every morning.   More than a month at Unknown time  . psyllium (METAMUCIL) 58.6 % powder Take 1 packet by mouth daily.   More than a month at Unknown time  . warfarin (COUMADIN) 10 MG tablet Take 1 tablet (10 mg total) by mouth one time only at 6 PM. 3 tablet 0 Taking  . warfarin (COUMADIN) 5 MG tablet Take 1 tablet (5 mg total) by mouth daily. 30 tablet 1 Taking    Review of Systems  Constitutional: Positive for malaise/fatigue. Negative for fever.  Gastrointestinal: Positive for abdominal pain.  Genitourinary: Negative for dysuria, urgency and frequency.        Vaginal bleeding  Neurological:  Negative for dizziness, loss of consciousness and weakness.   Physical Exam   Blood pressure 136/65, pulse 75, temperature 98.5 F (36.9 C), temperature source Oral, resp. rate 20, height 5\' 4"  (1.626 m), weight 282 lb (127.914 kg), last menstrual period 11/11/2012, SpO2 100 %.  Physical Exam  Nursing note and vitals reviewed. Constitutional: She is oriented to person, place, and time. She appears well-developed and well-nourished. No distress.  HENT:  Head: Normocephalic and atraumatic.  Cardiovascular: Normal rate.   Respiratory: Effort normal.  GI: Soft. She exhibits no distension and no mass. There is no tenderness. There is no rebound and no guarding.  Genitourinary: Uterus is not enlarged and not tender. Cervix exhibits no motion tenderness, no discharge and no friability. Right adnexum displays no mass and no tenderness. Left adnexum displays no mass and no tenderness. There is bleeding (small amount of blood in the vaginal vault) in the vagina. No vaginal discharge found.  Neurological: She is alert and oriented to person, place, and time.  Skin: Skin is warm and dry. No erythema.  Psychiatric: She has a normal mood and affect.   Results for orders placed or performed during the hospital encounter of 08/25/15 (from the past 24 hour(s))  Urinalysis, Routine w reflex microscopic (not at [facility])     Status: Abnormal   Collection Time: 08/25/15  8:24 PM  Result Value Ref Range   Color, Urine YELLOW YELLOW   APPearance HAZY (A) CLEAR   Specific Gravity, Urine 1.025 1.005 - 1.030   pH 5.5 5.0 - 8.0   Glucose, UA NEGATIVE NEGATIVE mg/dL   Hgb urine dipstick LARGE (A) NEGATIVE   Bilirubin Urine NEGATIVE NEGATIVE   Ketones, ur 15 (A) NEGATIVE mg/dL   Protein, ur NEGATIVE NEGATIVE mg/dL   Urobilinogen, UA 0.2 0.0 - 1.0 mg/dL   Nitrite NEGATIVE NEGATIVE   Leukocytes, UA NEGATIVE NEGATIVE  Urine microscopic-add on     Status: None   Collection Time: 08/25/15  8:24 PM  Result Value Ref  Range   Squamous Epithelial / LPF RARE RARE   WBC, UA 0-2 <3 WBC/hpf   RBC / HPF TOO NUMEROUS TO COUNT <3 RBC/hpf   Urine-Other MUCOUS PRESENT   CBC     Status: Abnormal   Collection Time: 08/25/15  8:43 PM  Result Value Ref Range   WBC 4.6 4.0 - 10.5 K/uL   RBC 4.28 3.87 - 5.11 MIL/uL  Hemoglobin 11.2 (L) 12.0 - 15.0 g/dL   HCT 34.5 (L) 36.0 - 46.0 %   MCV 80.6 78.0 - 100.0 fL   MCH 26.2 26.0 - 34.0 pg   MCHC 32.5 30.0 - 36.0 g/dL   RDW 16.1 (H) 11.5 - 15.5 %   Platelets 236 150 - 400 K/uL     MAU Course  Procedures  None  MDM CBC today Patient is hemodynamically stable. Will complete work-up for PMB as outpatient Patient states that she is able to take Tylenol #3 even though she is allergic to Percocet Assessment and Plan  A: Post menopausal bleeding Dysmenorrhea  P: Discharge home Rx for Megace and Tylenol #3 given to patient Bleeding precautions discussed Outpatient [place] ordered. They will call with an appointment time Patient advised to follow-up with [place] on 08/27/15 at 2:00 pm for further evaluation, most likely to include endometrial biopsy Patient may return to MAU as needed or if her condition were to change or worsen   Luvenia Redden, PA-C  08/25/2015, 8:54 PM

## 2015-08-25 NOTE — MAU Note (Signed)
Pt reports vaginal bleeding x 4 days, had not had bleeding in 6 years. Changing a pad every 2 hours, having a lot of lower abd pain.

## 2015-08-25 NOTE — Discharge Instructions (Signed)

## 2015-08-26 ENCOUNTER — Ambulatory Visit (HOSPITAL_COMMUNITY)
Admission: RE | Admit: 2015-08-26 | Discharge: 2015-08-26 | Disposition: A | Discharge status: Home / Self Care | Payer: Medicaid Other | Source: Ambulatory Visit | Attending: Medical | Admitting: Medical

## 2015-08-26 ENCOUNTER — Ambulatory Visit (INDEPENDENT_AMBULATORY_CARE_PROVIDER_SITE_OTHER): Payer: Medicaid Other | Admitting: Ophthalmology

## 2015-08-26 DIAGNOSIS — N950 Postmenopausal bleeding: Secondary | ICD-10-CM | POA: Insufficient documentation

## 2015-08-27 ENCOUNTER — Ambulatory Visit (INDEPENDENT_AMBULATORY_CARE_PROVIDER_SITE_OTHER): Payer: Medicaid Other | Admitting: Obstetrics & Gynecology

## 2015-08-27 ENCOUNTER — Encounter: Payer: Self-pay | Admitting: Obstetrics & Gynecology

## 2015-08-27 ENCOUNTER — Other Ambulatory Visit (HOSPITAL_COMMUNITY)
Admission: RE | Admit: 2015-08-27 | Discharge: 2015-08-27 | Disposition: A | Discharge status: Home / Self Care | Payer: Medicaid Other | Source: Ambulatory Visit | Attending: Obstetrics & Gynecology | Admitting: Obstetrics & Gynecology

## 2015-08-27 VITALS — BP 136/76 | HR 66 | Temp 98.5°F | Ht 64.0 in | Wt 278.6 lb

## 2015-08-27 DIAGNOSIS — N809 Endometriosis, unspecified: Secondary | ICD-10-CM

## 2015-08-27 DIAGNOSIS — N950 Postmenopausal bleeding: Secondary | ICD-10-CM

## 2015-08-27 DIAGNOSIS — N800 Endometriosis of uterus: Secondary | ICD-10-CM

## 2015-08-27 DIAGNOSIS — Z3202 Encounter for pregnancy test, result negative: Secondary | ICD-10-CM

## 2015-08-27 DIAGNOSIS — N8003 Adenomyosis of the uterus: Secondary | ICD-10-CM | POA: Insufficient documentation

## 2015-08-27 DIAGNOSIS — R87619 Unspecified abnormal cytological findings in specimens from cervix uteri: Secondary | ICD-10-CM | POA: Insufficient documentation

## 2015-08-27 LAB — POCT PREGNANCY, URINE: PREG TEST UR: NEGATIVE

## 2015-08-27 NOTE — Patient Instructions (Signed)
Return to clinic for any scheduled appointments or for any gynecologic concerns as needed.   

## 2015-08-27 NOTE — Progress Notes (Signed)
    Subjective:  Kaitlyn Castillo is a 54 y.o. female who presents today to the [facility] outpatient clinic with a chief complaint of uterine bleeding.   HPI: Patient was seen in the MAU 2 days ago with 4-5 days of heavy bleeding and cramping. Patient is post-menopausal. Patient was started on Megace, which has improved the bleeding. No vaginal discharge. Mild lower abdominal pain. Not sexually active.   Patient has not had uterine bleeding since 2 years ago. States that she had a work up at that time which was normal.    Objective:  Physical Exam: BP 136/76 mmHg  Pulse 66  Temp(Src) 98.5 F (36.9 C) (Oral)  Ht 5\' 4"  (1.626 m)  Wt 278 lb 9.6 oz (126.372 kg)  BMI 47.80 kg/m2  LMP 08/22/2015  GU: Normal female genitalia. Scant amount of dark red blood noted at cervical os.   Results for orders placed or performed in visit on 08/27/15 (from the past 72 hour(s))  Pregnancy, urine POC     Status: None   Collection Time: 08/27/15  2:51 PM  Result Value Ref Range   Preg Test, Ur NEGATIVE NEGATIVE    Comment:        THE SENSITIVITY OF THIS METHODOLOGY IS >24 mIU/mL    ENDOMETRIAL BIOPSY     The indications for endometrial biopsy were reviewed.   Risks of the biopsy including cramping, bleeding, infection, uterine perforation, inadequate specimen and need for additional procedures  were discussed. The patient states she understands and agrees to undergo procedure today. Consent was signed. Time out was performed. Urine HCG was negative. During the pelvic exam, the cervix was prepped with Chlorhexidine. The 3 mm pipelle was introduced into the endometrial cavity without difficulty to a depth of 10cm, and a moderate amount of tissue was obtained and sent to pathology. The instruments were removed from the patient's vagina. Minimal bleeding from the cervix was noted. The patient tolerated the procedure well. Routine post-procedure instructions were given to the patient.       Assessment/Plan:  Endometrial biopsy performed today. Will call with results. Continue Megace to control bleeding.   Algis Greenhouse. Jerline Pain, [place] Medicine Resident PGY-2 08/27/2015 2:56 PM   Attestation of Attending Supervision of Resident: Evaluation and management procedures were performed by the [facility] Medicine Resident under my supervision.  I have seen and examined the patient, reviewed the resident's note and chart, and I agree with the management and plan.  Verita Schneiders, MD, [place] Attending [place], [facility]

## 2015-09-01 ENCOUNTER — Telehealth: Payer: Self-pay | Admitting: General Practice

## 2015-09-01 NOTE — Telephone Encounter (Signed)
Per Dr Harolyn Rutherford patient had benign pathology on endo bx. Called patient, no answer- left message stating we are trying to reach you with non urgent results, please call us back at the clinics

## 2015-09-01 NOTE — Telephone Encounter (Signed)
Patient called back and left message stating she is returning our call. Called patient and informed her of results. Patient verbalized understanding and states she wants to know why she is having the bleeding and what are we going to do for it. Asked patient if she was still taking the megace and if it was helping. Patient states yes but she doesn't want to keep taking pills and wants to know why she is bleeding. Offered appt to come back in and see the doctor to see management going forward. Patient verbalized understanding. Told patient the front office will contact her with an appt. Patient verbalized understanding and had no questions

## 2015-09-05 ENCOUNTER — Ambulatory Visit (INDEPENDENT_AMBULATORY_CARE_PROVIDER_SITE_OTHER): Payer: Medicaid Other | Admitting: Ophthalmology

## 2015-09-24 ENCOUNTER — Ambulatory Visit: Payer: Self-pay | Admitting: Obstetrics & Gynecology

## 2016-02-25 ENCOUNTER — Ambulatory Visit (INDEPENDENT_AMBULATORY_CARE_PROVIDER_SITE_OTHER): Payer: Medicaid Other | Admitting: Podiatry

## 2016-02-25 ENCOUNTER — Encounter: Payer: Self-pay | Admitting: Podiatry

## 2016-02-25 VITALS — BP 163/83 | HR 65

## 2016-02-25 DIAGNOSIS — M79606 Pain in leg, unspecified: Secondary | ICD-10-CM | POA: Diagnosis not present

## 2016-02-25 DIAGNOSIS — B351 Tinea unguium: Secondary | ICD-10-CM | POA: Diagnosis not present

## 2016-02-25 NOTE — Patient Instructions (Signed)
Seen for hypertrophic nails. All nails debrided. Return in 3 months or as needed.  

## 2016-02-25 NOTE — Progress Notes (Signed)
Subjective: 55 year old female presents stating that she has had many death in family since she was here last in May 2016.  Blood sugar is up and down. She is back in Gym to reduce her weight.  Thick hard nails hurt to walk.   Objective: Thick dystrophic nails x 10. Digital corn distal end 2nd bilateral L>R. All pedal pulses are palpable. Contracted both first and 2nd toe bilateral.  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 Dystrophic nails x 10. Pain in lower limb with thick nails.   Plan: Debrided all nails.

## 2016-04-11 ENCOUNTER — Emergency Department (HOSPITAL_COMMUNITY)
Admission: EM | Admit: 2016-04-11 | Discharge: 2016-04-11 | Disposition: A | Discharge status: Home / Self Care | Payer: Medicaid Other | Attending: Emergency Medicine | Admitting: Emergency Medicine

## 2016-04-11 ENCOUNTER — Emergency Department (HOSPITAL_COMMUNITY): Payer: Medicaid Other

## 2016-04-11 ENCOUNTER — Encounter (HOSPITAL_COMMUNITY): Payer: Self-pay | Admitting: *Deleted

## 2016-04-11 DIAGNOSIS — R1110 Vomiting, unspecified: Secondary | ICD-10-CM | POA: Insufficient documentation

## 2016-04-11 DIAGNOSIS — I10 Essential (primary) hypertension: Secondary | ICD-10-CM | POA: Diagnosis not present

## 2016-04-11 DIAGNOSIS — Z79899 Other long term (current) drug therapy: Secondary | ICD-10-CM | POA: Diagnosis not present

## 2016-04-11 DIAGNOSIS — R05 Cough: Secondary | ICD-10-CM

## 2016-04-11 DIAGNOSIS — Z8719 Personal history of other diseases of the digestive system: Secondary | ICD-10-CM | POA: Diagnosis not present

## 2016-04-11 DIAGNOSIS — E119 Type 2 diabetes mellitus without complications: Secondary | ICD-10-CM | POA: Insufficient documentation

## 2016-04-11 DIAGNOSIS — J069 Acute upper respiratory infection, unspecified: Secondary | ICD-10-CM | POA: Diagnosis not present

## 2016-04-11 DIAGNOSIS — Z87828 Personal history of other (healed) physical injury and trauma: Secondary | ICD-10-CM | POA: Diagnosis not present

## 2016-04-11 DIAGNOSIS — Z87891 Personal history of nicotine dependence: Secondary | ICD-10-CM | POA: Diagnosis not present

## 2016-04-11 DIAGNOSIS — E669 Obesity, unspecified: Secondary | ICD-10-CM | POA: Insufficient documentation

## 2016-04-11 DIAGNOSIS — M1990 Unspecified osteoarthritis, unspecified site: Secondary | ICD-10-CM | POA: Diagnosis not present

## 2016-04-11 DIAGNOSIS — Z7984 Long term (current) use of oral hypoglycemic drugs: Secondary | ICD-10-CM | POA: Diagnosis not present

## 2016-04-11 DIAGNOSIS — R32 Unspecified urinary incontinence: Secondary | ICD-10-CM | POA: Diagnosis not present

## 2016-04-11 DIAGNOSIS — R059 Cough, unspecified: Secondary | ICD-10-CM

## 2016-04-11 MED ORDER — BENZONATATE 100 MG PO CAPS: 100.0000 mg | ORAL_CAPSULE | Freq: Three times a day (TID) | ORAL | Status: DC

## 2016-04-11 NOTE — ED Provider Notes (Signed)
CSN: [id number]     Arrival date & time 04/11/16  1953 History   First MD Initiated Contact with Patient 04/11/16 2038     Chief Complaint  Patient presents with  . Cough     (Consider location/radiation/quality/duration/timing/severity/associated sxs/prior Treatment) HPI   Kaitlyn Castillo is a 55 y.o F with Dm, HTN, HLD who presents to the ED c/o cough. Pt states that she has had a productive cough with yellow sputum for the last 2 weeks. Pt states that today prior to going to church she had a coughing fit which resulted in an episode of put-tussive emesis and urinary incontinence. Pt states that she did have a sore throat and right otalgia 1 week ago but these symptoms have since resolved. Pt states that she has tried multiple home remedies including lemon water, vinegar, theraflu, lozenges without much relief. She states that she is colder than normal, but is not having overt chills. Denies fevers, rhinorrhea, ST, otalgia, CP, SOB, wheezing, sinus pressure, HA, neck pain/stiffness. Denies tobacco use.    Past Medical History  Diagnosis Date  . Knee pain, right   . Diabetes mellitus   . Hypertension   . Obese   . High cholesterol   . Head injury, closed, with brief LOC ([place])   . Shortness of breath     with too much fluid- not currently  . Constipation   . Arthritis    Past Surgical History  Procedure Laterality Date  . Knee surgery      quad  . Quadriceps repair    . Cesarean section    . Colonoscopy    . Carbuncle      back of head  . Total knee arthroplasty Left 08/13/2014    Procedure: TOTAL KNEE ARTHROPLASTY;  [place]: Meredith Pel, MD;  Location: [place];  Service: Orthopedics;  Laterality: Left;   Family History  Problem Relation Age of Onset  . Diabetes Mother   . Hyperlipidemia Mother   . Hypertension Mother   . Diabetes Father   . Hyperlipidemia Father   . Hypertension Father   . Cancer Brother   . Heart disease Brother   . Hypertension Brother   .  Heart attack Brother    Social History  Substance Use Topics  . Smoking status: Former Smoker -- 0 years    Types: Cigarettes    Quit date: 12/13/1978  . Smokeless tobacco: Never Used  . Alcohol Use: No   OB History    Gravida Para Term Preterm AB TAB SAB Ectopic Multiple Living   8 4 4  4 4    4      Review of Systems  All other systems reviewed and are negative.     Allergies  Aleve; Iodine; Iohexol; and Percocet  Home Medications   Prior to Admission medications   Medication Sig Start Date End Date Taking? Authorizing Provider  acetaminophen-codeine (TYLENOL #3) 300-30 MG per tablet Take 1-2 tablets by mouth every 6 (six) hours as needed. 08/25/15   Luvenia Redden, PA-C  diclofenac sodium (VOLTAREN) 1 % GEL Apply 2 g topically 4 (four) times daily as needed (pain).    Historical Provider, MD  exenatide (BYETTA 10 MCG PEN) 10 MCG/0.04ML SOPN injection Inject 10 mcg into the skin 2 (two) times daily with a meal.    Historical Provider, MD  glipiZIDE-metformin (METAGLIP) 5-500 MG per tablet Take 1 tablet by mouth every morning.     Historical Provider, MD  lisinopril-hydrochlorothiazide Reita May)  20-25 MG per tablet Take 2 tablets by mouth daily.     Historical Provider, MD  megestrol (MEGACE) 40 MG tablet Take 1 tablet (40 mg total) by mouth 2 (two) times daily. 08/25/15   Luvenia Redden, PA-C   BP 168/85 mmHg  Pulse 78  Temp(Src) 98.8 F (37.1 C) (Oral)  Resp 20  Wt 124.059 kg  SpO2 100%  LMP 08/22/2015 Physical Exam  Constitutional: She is oriented to person, place, and time. She appears well-developed and well-nourished. No distress.  HENT:  Head: Normocephalic and atraumatic.  Right Ear: External ear normal.  Left Ear: External ear normal.  Nose: Nose normal.  Mouth/Throat: Oropharynx is clear and moist. No oropharyngeal exudate.  NO maxillary or frontal sinus tenderness.  Eyes: Conjunctivae are normal. Right eye exhibits no discharge. Left eye  exhibits no discharge. No scleral icterus.  Cardiovascular: Normal rate, regular rhythm, normal heart sounds and intact distal pulses.  Exam reveals no gallop.   No murmur heard. Pulmonary/Chest: Effort normal and breath sounds normal. No respiratory distress. She has no wheezes. She has no rales. She exhibits no tenderness.  Neurological: She is alert and oriented to person, place, and time. Coordination normal.  Skin: Skin is warm and dry. No rash noted. She is not diaphoretic. No erythema. No pallor.  Psychiatric: She has a normal mood and affect. Her behavior is normal.  Nursing note and vitals reviewed.   ED Course  Procedures (including critical care time) Labs Review Labs Reviewed - No data to display  Imaging Review Dg Chest 2 View  04/11/2016  CLINICAL DATA:  Yellowish productive cough for 2 weeks. Tonsillar pain. EXAM: CHEST  2 VIEW COMPARISON:  08/05/2014 FINDINGS: The heart size and mediastinal contours are within normal limits. Both lungs are clear. The visualized skeletal structures are unremarkable. IMPRESSION: No active cardiopulmonary disease. Electronically Signed   By: Lucienne Capers M.D.   On: 04/11/2016 21:38   I have personally reviewed and evaluated these images and lab results as part of my medical decision-making.   EKG Interpretation None      MDM   Final diagnoses:  Cough  URI (upper respiratory infection)    55 y.o F presents tot the ED for coughing x 2 weeks. Pt appears well in the ED, non-toxic, non-septic appearing. No labored breathing. Lungs CTAB. Pt CXR negative for acute infiltrate. Patients symptoms are consistent with URI, likely viral etiology. Discussed that antibiotics are not indicated for viral infections. Pt will be discharged with symptomatic treatment. Will give Tessalon for cough. Suspect pts urinary incontinence was due to stress from coughing. Verbalizes understanding and is agreeable with plan. Pt is hemodynamically stable & in NAD  prior to dc.     Dondra Spry [place], PA-C 04/11/16 [place], MD 04/11/16 [id number]

## 2016-04-11 NOTE — Discharge Instructions (Signed)
Cough, Adult Coughing is a reflex that clears your throat and your airways. Coughing helps to heal and protect your lungs. It is normal to cough occasionally, but a cough that happens with other symptoms or lasts a long time may be a sign of a condition that needs treatment. A cough may last only 2-3 weeks (acute), or it may last longer than 8 weeks (chronic). CAUSES Coughing is commonly caused by:  Breathing in substances that irritate your lungs.  A viral or bacterial respiratory infection.  Allergies.  Asthma.  Postnasal drip.  Smoking.  Acid backing up from the stomach into the esophagus (gastroesophageal reflux).  Certain medicines.  Chronic lung problems, including COPD (or rarely, lung cancer).  Other medical conditions such as heart failure. HOME CARE INSTRUCTIONS  Pay attention to any changes in your symptoms. Take these actions to help with your discomfort:  Take medicines only as told by your health care provider.  If you were prescribed an antibiotic medicine, take it as told by your health care provider. Do not stop taking the antibiotic even if you start to feel better.  Talk with your health care provider before you take a cough suppressant medicine.  Drink enough fluid to keep your urine clear or pale yellow.  If the air is dry, use a cold steam vaporizer or humidifier in your bedroom or your home to help loosen secretions.  Avoid anything that causes you to cough at work or at home.  If your cough is worse at night, try sleeping in a semi-upright position.  Avoid cigarette smoke. If you smoke, quit smoking. If you need help quitting, ask your health care provider.  Avoid caffeine.  Avoid alcohol.  Rest as needed. SEEK MEDICAL CARE IF:   You have new symptoms.  You cough up pus.  Your cough does not get better after 2-3 weeks, or your cough gets worse.  You cannot control your cough with suppressant medicines and you are losing sleep.  You  develop pain that is getting worse or pain that is not controlled with pain medicines.  You have a fever.  You have unexplained weight loss.  You have night sweats. SEEK IMMEDIATE MEDICAL CARE IF:  You cough up blood.  You have difficulty breathing.  Your heartbeat is very fast.   This information is not intended to replace advice given to you by your health care provider. Make sure you discuss any questions you have with your health care provider.   Your chest xray did not show any sign of Pneumonia or infection today. Follow up with your PCP if your symptoms do not improve. Take Tessalon as needed for cough. Return to the Emergency Department if you experience severe worsening of your symptoms, fevers, chills, chest pain, difficulty breathing.

## 2016-04-11 NOTE — ED Notes (Signed)
Patient presents stating she has been coughing more over the last week - prod cough with "golden colored" sputum  Excessive coughing and is now urinating on herself

## 2016-05-27 ENCOUNTER — Ambulatory Visit (INDEPENDENT_AMBULATORY_CARE_PROVIDER_SITE_OTHER): Payer: Medicaid Other | Admitting: Podiatry

## 2016-05-27 ENCOUNTER — Encounter: Payer: Self-pay | Admitting: Podiatry

## 2016-05-27 VITALS — BP 183/96 | HR 76

## 2016-05-27 DIAGNOSIS — M79673 Pain in unspecified foot: Secondary | ICD-10-CM | POA: Diagnosis not present

## 2016-05-27 DIAGNOSIS — M79606 Pain in leg, unspecified: Secondary | ICD-10-CM

## 2016-05-27 DIAGNOSIS — B351 Tinea unguium: Secondary | ICD-10-CM

## 2016-05-27 NOTE — Progress Notes (Signed)
Subjective: 55 year old female presents requesting toe nails trimmed. She is back in GYM and doing well. Thick hard nails hurt to walk. Her blood sugar is well under control. She is have more feelings in her left foot.   Objective: Thick dystrophic nails x 10. Digital corn distal end 2nd bilateral L>R. All pedal pulses are palpable. Contracted both first and 2nd toe bilateral.  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 Dystrophic nails x 10. Pain in lower limb with thick nails.   Plan: Debrided all nails

## 2016-05-27 NOTE — Patient Instructions (Signed)
Seen for hypertrophic nails. All nails debrided. Return in 3 months or as needed.  

## 2016-07-19 ENCOUNTER — Encounter (HOSPITAL_COMMUNITY): Payer: Self-pay | Admitting: Emergency Medicine

## 2016-07-19 ENCOUNTER — Ambulatory Visit (HOSPITAL_COMMUNITY)
Admission: EM | Admit: 2016-07-19 | Discharge: 2016-07-19 | Disposition: A | Discharge status: Home / Self Care | Payer: Medicaid Other | Attending: Emergency Medicine | Admitting: Emergency Medicine

## 2016-07-19 DIAGNOSIS — L02232 Carbuncle of back [any part, except buttock]: Secondary | ICD-10-CM | POA: Diagnosis not present

## 2016-07-19 DIAGNOSIS — L02222 Furuncle of back [any part, except buttock]: Secondary | ICD-10-CM

## 2016-07-19 MED ORDER — SULFAMETHOXAZOLE-TRIMETHOPRIM 800-160 MG PO TABS
1.0000 | ORAL_TABLET | Freq: Two times a day (BID) | ORAL | 0 refills | Status: AC
Start: 2016-07-19 — End: 2016-07-26

## 2016-07-19 NOTE — Discharge Instructions (Signed)
You have a very early abscess developing on your back. Take Bactrim twice a day for 7 days. Use the steam room or apply warm compresses as often as you can. This should improve over the next 2 days. Please come back if it is getting worse as we may need to drain it.

## 2016-07-19 NOTE — ED Provider Notes (Signed)
[place]    CSN: [id number] Arrival date & time: 07/19/16  1910  First Provider Contact:  First MD Initiated Contact with Patient 07/19/16 2047        History   Chief Complaint Chief Complaint  Patient presents with  . Mass    HPI Kaitlyn Castillo is a 55 y.o. female.   She is a 55 year old woman here for evaluation of bump on her back. She reports feeling a little bit of discomfort on the left shoulder blade yesterday. Today, it was more noticeable and she saw bump in the mirror. She denies any fevers or chills. No known injury or trauma. She does state she has killed several wasps in her bathroom recently, but does not recall any sting.  Rebound is painful, particularly when the bra strap or clothing rubs over it.      Past Medical History:  Diagnosis Date  . Arthritis   . Constipation   . Diabetes mellitus   . Head injury, closed, with brief LOC ([place])   . High cholesterol   . Hypertension   . Knee pain, right   . Obese   . Shortness of breath    with too much fluid- not currently    Patient Active Problem List   Diagnosis Date Noted  . Postmenopausal bleeding 08/27/2015  . Adenomyosis 08/27/2015  . Skin ulcer of left foot including toes ([place]) 05/02/2015  . Equinus deformity of foot, acquired 04/02/2015  . Tenosynovitis of left foot 04/02/2015  . Arthritis of knee 08/13/2014  . Pain in lower limb 04/24/2014  . Onychomycosis 11/02/2013  . Pain, foot 11/02/2013  . Acquired deformity of toe 11/02/2013  . Unspecified venous (peripheral) insufficiency 09/13/2012  . Cellulitis of left leg 07/07/2012  . ARF (acute renal failure) ([place]) 07/07/2012  . LEG PAIN, LEFT 01/10/2008  . ANXIETY 11/19/2007  . DIABETIC FOOT ULCER, TOE 11/17/2007  . CHEST PAIN UNSPECIFIED 11/01/2007  . DIABETES MELLITUS, TYPE II 07/29/2007  . DEPRESSION 07/29/2007  . HYPERTENSION 07/29/2007    Past Surgical History:  Procedure Laterality Date  . carbuncle     back of  head  . CESAREAN SECTION    . COLONOSCOPY    . KNEE SURGERY     quad  . QUADRICEPS REPAIR    . TOTAL KNEE ARTHROPLASTY Left 08/13/2014   Procedure: TOTAL KNEE ARTHROPLASTY;  Boettger: Meredith Pel, MD;  Location: [facility];  Service: Orthopedics;  Laterality: Left;    OB History    Gravida Para Term Preterm AB Living   8 4 4   4 4    SAB TAB Ectopic Multiple Live Births     4     4       Home Medications    Prior to Admission medications   Medication Sig Start Date End Date Taking? Authorizing Provider  exenatide (BYETTA 10 MCG PEN) 10 MCG/0.04ML SOPN injection Inject 10 mcg into the skin 2 (two) times daily with a meal.   Yes Historical Provider, MD  glipiZIDE-metformin (METAGLIP) 5-500 MG per tablet Take 1 tablet by mouth every morning.    Yes Historical Provider, MD  lisinopril-hydrochlorothiazide (PRINZIDE,ZESTORETIC) 20-25 MG per tablet Take 2 tablets by mouth daily.    Yes Historical Provider, MD  acetaminophen-codeine (TYLENOL #3) 300-30 MG per tablet Take 1-2 tablets by mouth every 6 (six) hours as needed. Patient not taking: Reported on 04/11/2016 08/25/15   Luvenia Redden, PA-C  APPLE CIDER VINEGAR PO Take 5 mLs  by mouth daily.    Historical Provider, MD  benzonatate (TESSALON) 100 MG capsule Take 1 capsule (100 mg total) by mouth every 8 (eight) hours. 04/11/16   Samantha Tripp Dowless, PA-C  GARCINIA CAMBOGIA-CHROMIUM PO Take 2 capsules by mouth daily.    Historical Provider, MD  megestrol (MEGACE) 40 MG tablet Take 1 tablet (40 mg total) by mouth 2 (two) times daily. Patient not taking: Reported on 04/11/2016 08/25/15   Luvenia Redden, PA-C  Omega-3 Fatty Acids (FISH OIL PO) Take 1 capsule by mouth daily.    Historical Provider, MD  psyllium (METAMUCIL) 58.6 % powder Take 1 packet by mouth daily.    Historical Provider, MD  sulfamethoxazole-trimethoprim (BACTRIM DS,SEPTRA DS) 800-160 MG tablet Take 1 tablet by mouth 2 (two) times daily. 07/19/16 07/26/16  Melony Overly, MD     Family History Family History  Problem Relation Age of Onset  . Diabetes Mother   . Hyperlipidemia Mother   . Hypertension Mother   . Diabetes Father   . Hyperlipidemia Father   . Hypertension Father   . Cancer Brother   . Heart disease Brother   . Hypertension Brother   . Heart attack Brother     Social History Social History  Substance Use Topics  . Smoking status: Former Smoker    Years: 0.00    Types: Cigarettes    Quit date: 12/13/1978  . Smokeless tobacco: Never Used  . Alcohol use No     Allergies   Aleve [naproxen sodium]; Iodine; Iohexol; and Percocet [oxycodone-acetaminophen]   Review of Systems Review of Systems  Constitutional: Negative for fever.  Skin:       Bump on left shoulder blade     Physical Exam Triage Vital Signs ED Triage Vitals [07/19/16 2010]  Enc Vitals Group     BP 149/80     Pulse Rate 78     Resp 16     Temp 98.6 F (37 C)     Temp Source Oral     SpO2 100 %     Weight      Height      Head Circumference      Peak Flow      Pain Score      Pain Loc      Pain Edu?      Excl. in [place]?    No data found.   Updated Vital Signs BP 149/80 (BP Location: Left Arm)   Pulse 78   Temp 98.6 F (37 C) (Oral)   Resp 16   LMP 08/22/2015   SpO2 100%   Visual Acuity Right Eye Distance:   Left Eye Distance:   Bilateral Distance:    Right Eye Near:   Left Eye Near:    Bilateral Near:     Physical Exam  Constitutional: She is oriented to person, place, and time. She appears well-developed and well-nourished. No distress.  Cardiovascular: Normal rate.   Pulmonary/Chest: Effort normal.  Neurological: She is alert and oriented to person, place, and time.  Skin:  0.5 cm firm and tender papule on the medial aspect of the left shoulder blade. No fluctuance     UC Treatments / Results  Labs (all labs ordered are listed, but only abnormal results are displayed) Labs Reviewed - No data to display  EKG  EKG  Interpretation None       Radiology No results found.  Procedures Procedures (including critical care time)  Medications Ordered in  UC Medications - No data to display   Initial Impression / Assessment and Plan / UC Course  I have reviewed the triage vital signs and the nursing notes.  Pertinent labs & imaging results that were available during my care of the patient were reviewed by me and considered in my medical decision making (see chart for details).  Clinical Course    Exam is consistent with an early abscess. Not amenable to drainage at this time. We'll treat with Bactrim and recommended frequent warm compresses. Follow-up as needed.  Final Clinical Impressions(s) / UC Diagnoses   Final diagnoses:  Boil, back    New Prescriptions Discharge Medication List as of 07/19/2016  8:56 PM    START taking these medications   Details  sulfamethoxazole-trimethoprim (BACTRIM DS,SEPTRA DS) 800-160 MG tablet Take 1 tablet by mouth 2 (two) times daily., Starting Mon 07/19/2016, Until Mon 07/26/2016, Normal         Melony Overly, MD 07/19/16 2106

## 2016-07-19 NOTE — ED Triage Notes (Signed)
The patient presented to the [facility] with a complaint of a possible mass or abscess on her left shoulder blade that she noticed yesterday.

## 2016-07-22 IMAGING — US US PELVIS COMPLETE
1 series · 15 of 25 positions shown · non-contrast
Comparison: CT 02/09/2009

CLINICAL DATA: Postmenopausal bleeding

EXAM:
TRANSABDOMINAL AND TRANSVAGINAL ULTRASOUND OF PELVIS
TECHNIQUE: Both transabdominal and transvaginal ultrasound examinations of the
pelvis were performed. Transabdominal technique was performed for
global imaging of the pelvis including uterus, ovaries, adnexal
regions, and pelvic cul-de-sac. It was necessary to proceed with
endovaginal exam following the transabdominal exam to visualize the
uterus, endometrium, ovaries and adnexa .

[Series 1: us pelvis complete · 15 of 46 slices shown]
[im 1/46]
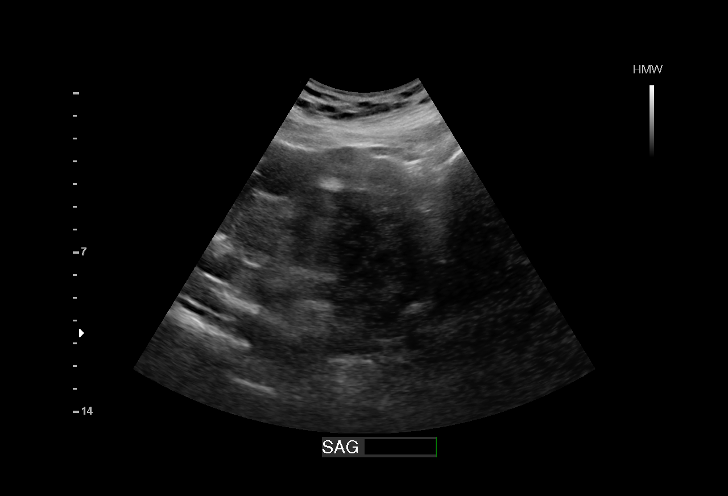
[im 4/46]
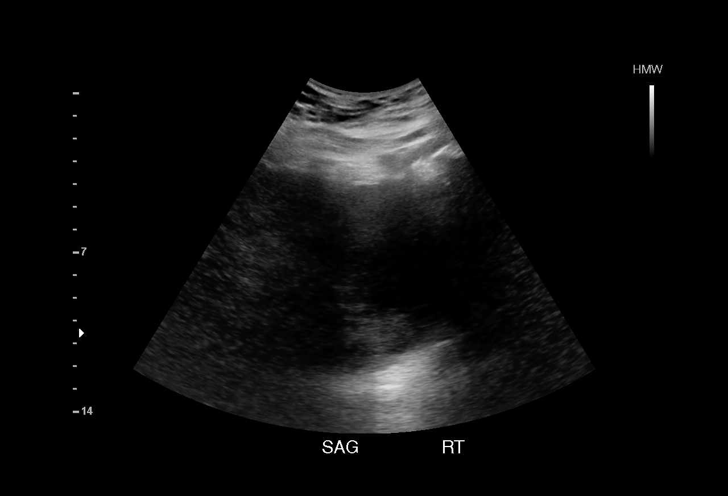
[im 8/46]
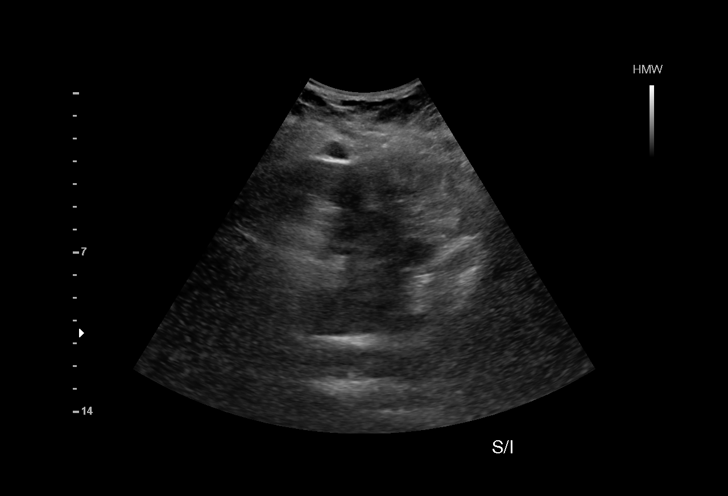
[im 10/46]
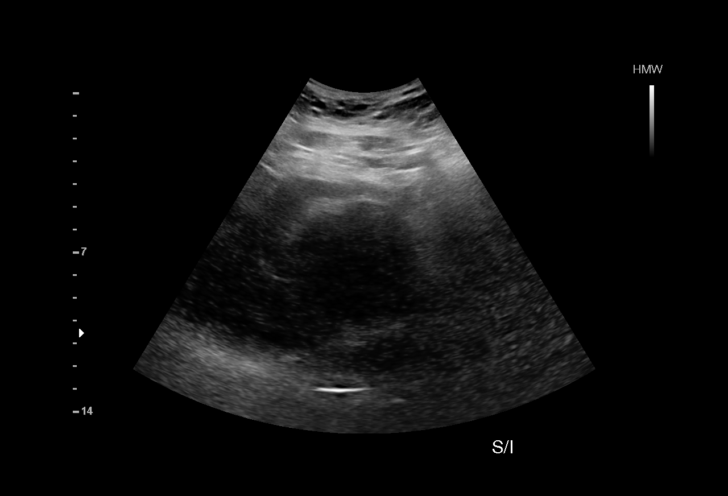
[im 14/46]
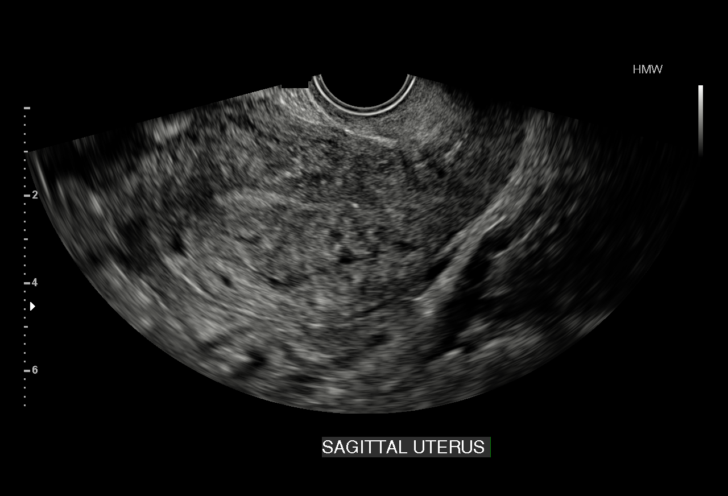
[im 17/46]
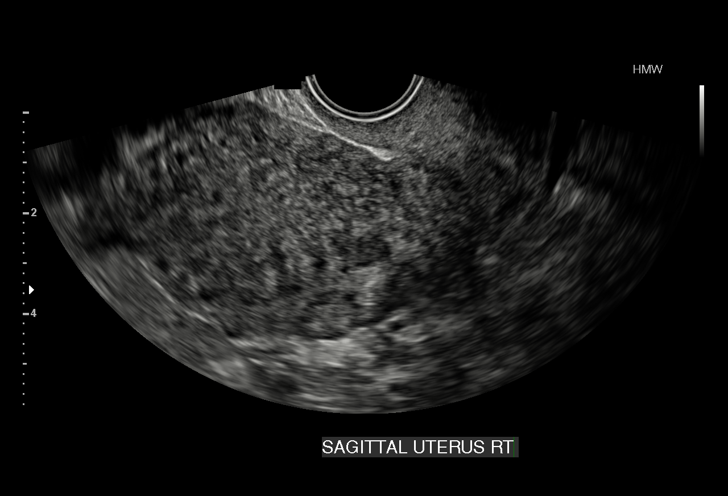
[im 19/46]
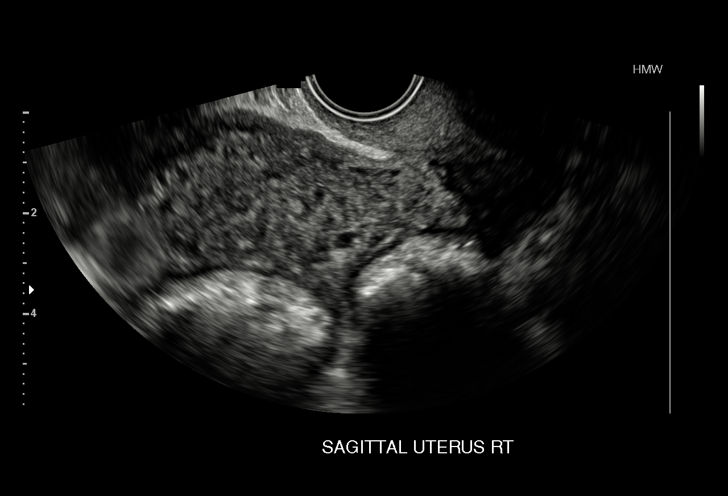
[im 23/46]
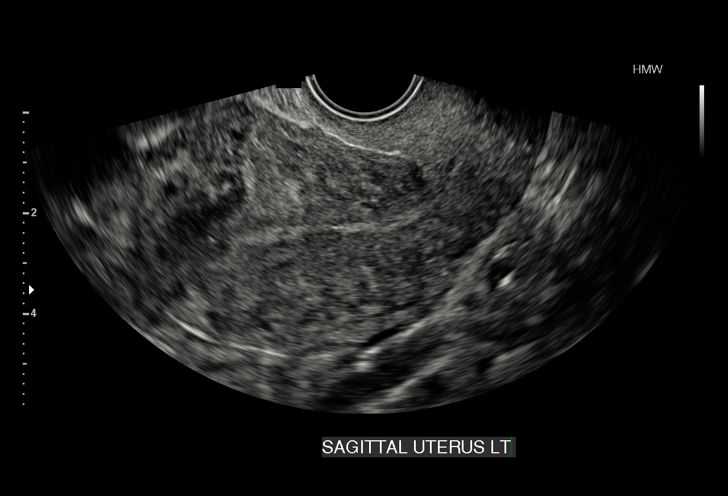
[im 27/46]
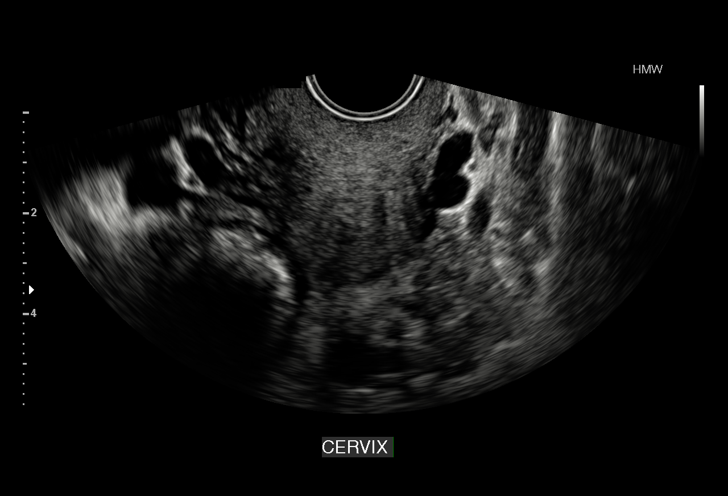
[im 29/46]
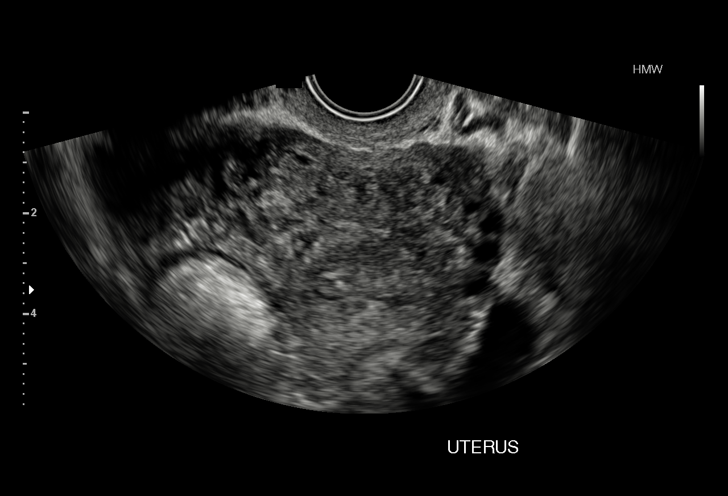
[im 32/46]
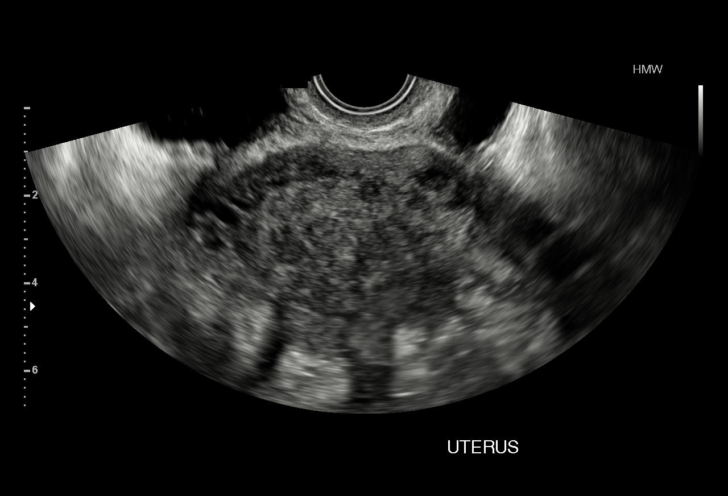
[im 36/46]
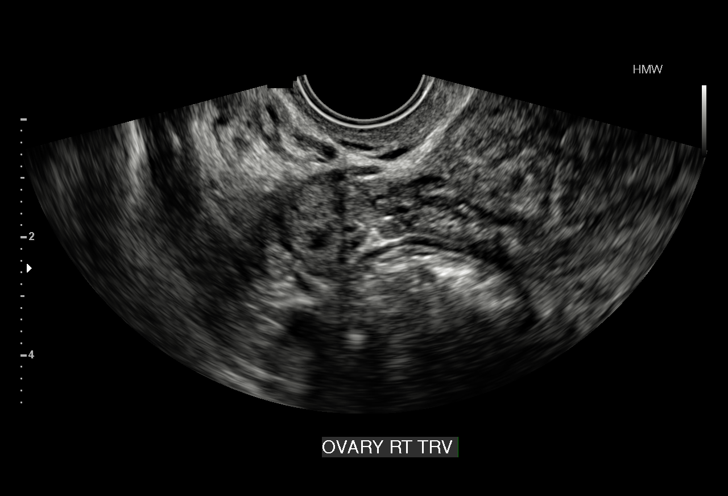
[im 38/46]
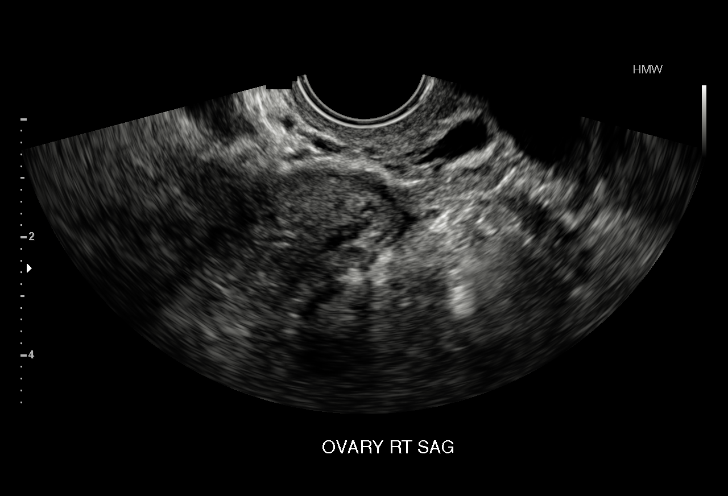
[im 42/46]
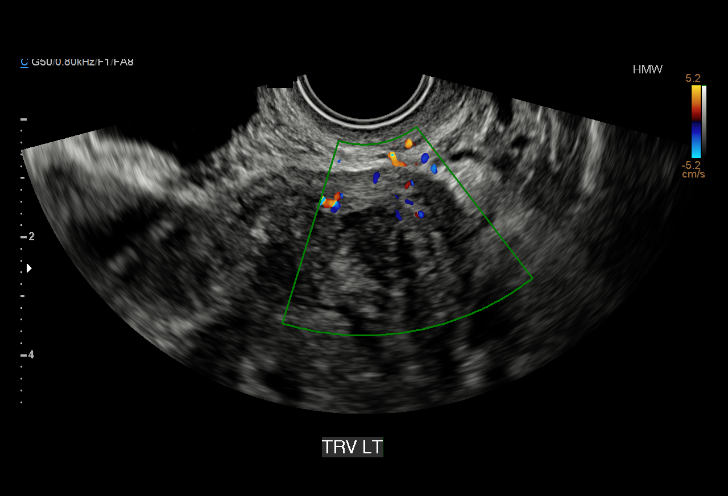
[im 46/46]
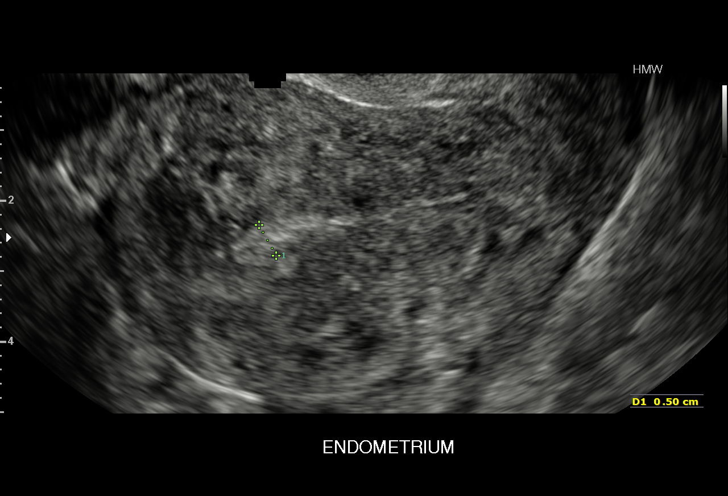

[15 of 25 positions shown; findings below may reference images not displayed]

FINDINGS: Uterus

Measurements: 9.1 x 5.2 x 5.4 cm. No fibroids or other mass
visualized. Diffusely heterogeneous echotexture throughout the
uterus.

Endometrium

Thickness: 5 mm in thickness.  No focal abnormality visualized.

Right ovary

Measurements: 3.0 x 2.1 x 1.8 cm. Normal appearance/no adnexal mass.

Left ovary

Measurements: Not visualize. No adnexal masses seen.

Other findings

No free fluid.
IMPRESSION: Endometrium appears normal. Diffusely heterogeneous echotexture
throughout the myometrium suggest possibility of adenomyosis.

## 2016-08-26 ENCOUNTER — Ambulatory Visit: Payer: Medicaid Other | Admitting: Podiatry

## 2016-09-01 ENCOUNTER — Ambulatory Visit: Payer: Medicaid Other | Admitting: Podiatry

## 2016-09-02 ENCOUNTER — Other Ambulatory Visit: Payer: Self-pay | Admitting: Internal Medicine

## 2016-09-02 DIAGNOSIS — Z1231 Encounter for screening mammogram for malignant neoplasm of breast: Secondary | ICD-10-CM

## 2016-09-14 ENCOUNTER — Inpatient Hospital Stay: Admission: RE | Admit: 2016-09-14 | Payer: Medicaid Other | Source: Ambulatory Visit

## 2016-12-29 ENCOUNTER — Ambulatory Visit: Payer: Medicaid Other | Admitting: Podiatry

## 2017-01-05 ENCOUNTER — Ambulatory Visit (INDEPENDENT_AMBULATORY_CARE_PROVIDER_SITE_OTHER): Payer: Medicaid Other | Admitting: Podiatry

## 2017-01-05 ENCOUNTER — Encounter: Payer: Self-pay | Admitting: Podiatry

## 2017-01-05 VITALS — BP 144/74 | HR 72

## 2017-01-05 DIAGNOSIS — M79672 Pain in left foot: Secondary | ICD-10-CM

## 2017-01-05 DIAGNOSIS — M79671 Pain in right foot: Secondary | ICD-10-CM

## 2017-01-05 DIAGNOSIS — B351 Tinea unguium: Secondary | ICD-10-CM

## 2017-01-05 NOTE — Patient Instructions (Signed)
Seen for hypertrophic nails and painful corn 3rd digit left. All nails debrided. Buttress pad placed under 3rd digit left. Return in 3 months or as needed.

## 2017-01-05 NOTE — Progress Notes (Signed)
Subjective: 55 year old female presents requesting toe nails trimmed. 3rd toe left foot is hurting from a build up digital corn at the end. Her last visit to the office was 7 months ago. She is back in GYM and doing well. Thick hard nails hurt to walk. Her blood sugar is well under control. She is have more feelings in her left foot.   Objective: Thick dystrophic nails x 10. Pre ulcerative digital corn distal end 3rd digit left foot. All pedal pulses are palpable. Contracted digits 3rd bilateral.  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 Dystrophic nails x 10. Pain in lower limb with thick nails.   Plan: Debrided all nails. Left foot pre ulcerative lesion debrided and buttress pad placed. Return in 3 months or sooner if needed. 

## 2017-01-23 ENCOUNTER — Encounter (HOSPITAL_COMMUNITY): Payer: Self-pay | Admitting: *Deleted

## 2017-01-23 ENCOUNTER — Emergency Department (HOSPITAL_COMMUNITY)
Admission: EM | Admit: 2017-01-23 | Discharge: 2017-01-23 | Disposition: A | Discharge status: Home / Self Care | Payer: Medicaid Other | Attending: Emergency Medicine | Admitting: Emergency Medicine

## 2017-01-23 ENCOUNTER — Emergency Department (HOSPITAL_COMMUNITY): Payer: Medicaid Other

## 2017-01-23 DIAGNOSIS — R509 Fever, unspecified: Secondary | ICD-10-CM | POA: Diagnosis present

## 2017-01-23 DIAGNOSIS — R05 Cough: Secondary | ICD-10-CM | POA: Diagnosis not present

## 2017-01-23 DIAGNOSIS — J111 Influenza due to unidentified influenza virus with other respiratory manifestations: Secondary | ICD-10-CM | POA: Insufficient documentation

## 2017-01-23 DIAGNOSIS — W1830XA Fall on same level, unspecified, initial encounter: Secondary | ICD-10-CM | POA: Insufficient documentation

## 2017-01-23 DIAGNOSIS — Z87891 Personal history of nicotine dependence: Secondary | ICD-10-CM | POA: Diagnosis not present

## 2017-01-23 DIAGNOSIS — Z79899 Other long term (current) drug therapy: Secondary | ICD-10-CM | POA: Diagnosis not present

## 2017-01-23 DIAGNOSIS — Y999 Unspecified external cause status: Secondary | ICD-10-CM | POA: Diagnosis not present

## 2017-01-23 DIAGNOSIS — Y939 Activity, unspecified: Secondary | ICD-10-CM | POA: Insufficient documentation

## 2017-01-23 DIAGNOSIS — Y929 Unspecified place or not applicable: Secondary | ICD-10-CM | POA: Insufficient documentation

## 2017-01-23 DIAGNOSIS — R69 Illness, unspecified: Secondary | ICD-10-CM

## 2017-01-23 DIAGNOSIS — Z96652 Presence of left artificial knee joint: Secondary | ICD-10-CM | POA: Diagnosis not present

## 2017-01-23 DIAGNOSIS — E119 Type 2 diabetes mellitus without complications: Secondary | ICD-10-CM | POA: Diagnosis not present

## 2017-01-23 DIAGNOSIS — I10 Essential (primary) hypertension: Secondary | ICD-10-CM | POA: Diagnosis not present

## 2017-01-23 DIAGNOSIS — R059 Cough, unspecified: Secondary | ICD-10-CM

## 2017-01-23 LAB — CBC WITH DIFFERENTIAL/PLATELET
Basophils Absolute: 0 10*3/uL (ref 0.0–0.1)
Basophils Relative: 1 %
EOS ABS: 0 10*3/uL (ref 0.0–0.7)
Eosinophils Relative: 0 %
HEMATOCRIT: 35.7 % — AB (ref 36.0–46.0)
HEMOGLOBIN: 11.5 g/dL — AB (ref 12.0–15.0)
LYMPHS ABS: 0.9 10*3/uL (ref 0.7–4.0)
Lymphocytes Relative: 30 %
MCH: 26.9 pg (ref 26.0–34.0)
MCHC: 32.2 g/dL (ref 30.0–36.0)
MCV: 83.6 fL (ref 78.0–100.0)
Monocytes Absolute: 0.3 10*3/uL (ref 0.1–1.0)
Monocytes Relative: 9 %
NEUTROS ABS: 1.8 10*3/uL (ref 1.7–7.7)
NEUTROS PCT: 60 %
Platelets: 229 10*3/uL (ref 150–400)
RBC: 4.27 MIL/uL (ref 3.87–5.11)
RDW: 14.3 % (ref 11.5–15.5)
WBC: 3 10*3/uL — AB (ref 4.0–10.5)

## 2017-01-23 LAB — I-STAT CHEM 8, ED
BUN: 21 mg/dL — AB (ref 6–20)
CALCIUM ION: 1.13 mmol/L — AB (ref 1.15–1.40)
CHLORIDE: 102 mmol/L (ref 101–111)
Creatinine, Ser: 1 mg/dL (ref 0.44–1.00)
Glucose, Bld: 95 mg/dL (ref 65–99)
HCT: 35 % — ABNORMAL LOW (ref 36.0–46.0)
Hemoglobin: 11.9 g/dL — ABNORMAL LOW (ref 12.0–15.0)
Potassium: 3.8 mmol/L (ref 3.5–5.1)
SODIUM: 140 mmol/L (ref 135–145)
TCO2: 27 mmol/L (ref 0–100)

## 2017-01-23 LAB — INFLUENZA PANEL BY PCR (TYPE A & B)
Influenza A By PCR: POSITIVE — AB
Influenza B By PCR: NEGATIVE

## 2017-01-23 MED ORDER — OSELTAMIVIR PHOSPHATE 75 MG PO CAPS: 75.0000 mg | ORAL_CAPSULE | Freq: Two times a day (BID) | ORAL | 0 refills | Status: DC

## 2017-01-23 MED ORDER — BENZONATATE 100 MG PO CAPS
100.0000 mg | ORAL_CAPSULE | Freq: Three times a day (TID) | ORAL | 0 refills | Status: DC
Start: 2017-01-23 — End: 2017-12-08

## 2017-01-23 NOTE — ED Triage Notes (Signed)
Pt reports she has had Flu Sx's for 3 days. Pt rfeports cough, chills. Ear aches. Pt also reports falling out of the EMS truck today and is sore all over.

## 2017-01-23 NOTE — Discharge Instructions (Signed)
It is nice taking care of you today!  Your symptoms are likely due to viral infections. It is possible that you could have a flu. We gave you prescription for Tamiflu. Please fill this prescription and start taking it. We also recommend you follow up with her primary care doctor as soon as possible.   - Get plenty of rest and adequate hydration. - Consume warm fluids (soup or tea) to provide relief for a stuffy nose and to loosen phlegm. - For nasal stuffiness, try saline nasal spray or a Neti Pot. Eating warm liquids such as chicken soup or tea may also help with nasal congestion. - For sore throat pain relief: suck on throat lozenges, hard candy or popsicles; gargle with warm salt water (1/4 tsp. salt per 8 oz. of water); and eat soft, bland foods. - Eat a well-balanced diet. If you cannot, ensure you are getting enough nutrients by taking a daily multivitamin. - Avoid dairy products, as they can thicken phlegm. - Avoid alcohol, as it impairs your body?s immune system.  CONTACT YOUR DOCTOR IF YOU EXPERIENCE ANY OF THE FOLLOWING: - High fever, chest pain, shortness of breath or  not able to keep down food or fluids.  - Cough that gets worse while other cold symptoms improve - Flare up of any chronic lung problem, such as asthma - Your symptoms persist longer than 2 weeks

## 2017-01-23 NOTE — ED Provider Notes (Signed)
[facility] DEPT Provider Note   CSN: [id number] Arrival date & time: 01/23/17  [id number]   History   Chief Complaint Chief Complaint  Patient presents with  . Fever  . Chills  . Fall    HPI Kaitlyn Castillo is a 56 y.o. female.  HPI Kaitlyn Castillo 71-year-old female with history of diabetes, hypertension, oasteoarthritis who presented with one-day history of chills, cough and myalgia. Patient reports having dry cough, chills, myalgia and difficulty breathing with some discomfort in her throat. Cough is dry. She states that she feels like she couldn't get enough air in when she breaths. Denies fever. She states drinking a lot of [place] pepper to help with chills. She says "I was trying to set on a fire inside my body to see if that helps with the chill". She reports chest pain only with cough. Denies headache, photophobia, dizziness, nausea vomiting, diarrhea or dysuria. She reports baseline numbness in extremities but denies focal weakness or speech issue.   Past Medical History:  Diagnosis Date  . Arthritis   . Constipation   . Diabetes mellitus   . Head injury, closed, with brief LOC ([place])   . High cholesterol   . Hypertension   . Knee pain, right   . Obese   . Shortness of breath    with too much fluid- not currently   [place] Patient Active Problem List   Diagnosis Date Noted  . Postmenopausal bleeding 08/27/2015  . Adenomyosis 08/27/2015  . Skin ulcer of left foot including toes ([place]) 05/02/2015  . Equinus deformity of foot, acquired 04/02/2015  . Tenosynovitis of left foot 04/02/2015  . Arthritis of knee 08/13/2014  . Pain in lower limb 04/24/2014  . Onychomycosis 11/02/2013  . Pain, foot 11/02/2013  . Acquired deformity of toe 11/02/2013  . Unspecified venous (peripheral) insufficiency 09/13/2012  . Cellulitis of left leg 07/07/2012  . ARF (acute renal failure) ([place]) 07/07/2012  . LEG PAIN, LEFT 01/10/2008  . ANXIETY 11/19/2007  . DIABETIC FOOT ULCER, TOE  11/17/2007  . CHEST PAIN UNSPECIFIED 11/01/2007  . DIABETES MELLITUS, TYPE II 07/29/2007  . DEPRESSION 07/29/2007  . HYPERTENSION 07/29/2007    Past Surgical History:  Procedure Laterality Date  . carbuncle     back of head  . CESAREAN SECTION    . COLONOSCOPY    . KNEE SURGERY     quad  . QUADRICEPS REPAIR    . TOTAL KNEE ARTHROPLASTY Left 08/13/2014   Procedure: TOTAL KNEE ARTHROPLASTY;  [place]: Meredith Pel, MD;  Location: [facility];  Service: Orthopedics;  Laterality: Left;    OB History    Gravida Para Term Preterm AB Living   8 4 4   4 4    SAB TAB Ectopic Multiple Live Births     4     4       Home Medications    Prior to Admission medications   Medication Sig Start Date End Date Taking? Authorizing Provider  Carbamide Peroxide (EAR WAX DROPS OT) Place 3-4 drops into both ears as needed (for ear wax).   Yes Historical Provider, MD  exenatide (BYETTA 10 MCG PEN) 10 MCG/0.04ML SOPN injection Inject 10 mcg into the skin 2 (two) times daily with a meal.   Yes Historical Provider, MD  glipiZIDE-metformin (METAGLIP) 5-500 MG per tablet Take 1 tablet by mouth 2 (two) times daily.    Yes Historical Provider, MD  lisinopril-hydrochlorothiazide (PRINZIDE,ZESTORETIC) 20-25 MG per tablet Take 1 tablet by mouth daily.  Yes Historical Provider, MD  Omega-3 Fatty Acids (FISH OIL PO) Take 1 capsule by mouth daily.   Yes Historical Provider, MD  acetaminophen-codeine (TYLENOL #3) 300-30 MG per tablet Take 1-2 tablets by mouth every 6 (six) hours as needed. Patient not taking: Reported on 04/11/2016 08/25/15   Luvenia Redden, PA-C  benzonatate (TESSALON) 100 MG capsule Take 1 capsule (100 mg total) by mouth every 8 (eight) hours. 01/23/17   Mercy Riding, MD  megestrol (MEGACE) 40 MG tablet Take 1 tablet (40 mg total) by mouth 2 (two) times daily. Patient not taking: Reported on 04/11/2016 08/25/15   Luvenia Redden, PA-C  oseltamivir (TAMIFLU) 75 MG capsule Take 1 capsule (75 mg total) by  mouth 2 (two) times daily. 01/23/17   Mercy Riding, MD    Family History Family History  Problem Relation Age of Onset  . Diabetes Mother   . Hyperlipidemia Mother   . Hypertension Mother   . Diabetes Father   . Hyperlipidemia Father   . Hypertension Father   . Cancer Brother   . Heart disease Brother   . Hypertension Brother   . Heart attack Brother     Social History Social History  Substance Use Topics  . Smoking status: Former Smoker    Years: 0.00    Types: Cigarettes    Quit date: 12/13/1978  . Smokeless tobacco: Never Used  . Alcohol use No     Allergies   Aleve [naproxen sodium]; Iodine; Iohexol; and Percocet [oxycodone-acetaminophen]   Review of Systems Review of Systems  Constitutional: Positive for chills. Negative for appetite change, fever and unexpected weight change.  HENT: Negative for hearing loss, sore throat and trouble swallowing.   Eyes: Negative for visual disturbance.  Respiratory: Negative for cough, chest tightness, shortness of breath and wheezing.   Cardiovascular: Negative for chest pain, palpitations and leg swelling.  Gastrointestinal: Negative for abdominal pain, blood in stool and constipation.  Endocrine: Negative for cold intolerance and heat intolerance.  Genitourinary: Negative for dyspareunia, dysuria, genital sores, hematuria and vaginal bleeding.  Musculoskeletal: Negative for arthralgias, joint swelling and myalgias.  Skin: Negative for rash.  Neurological: Negative for weakness, numbness and headaches.  Hematological: Negative for adenopathy. Does not bruise/bleed easily.  Psychiatric/Behavioral: Negative for dysphoric mood and sleep disturbance. The patient is not nervous/anxious.    Physical Exam Updated Vital Signs BP 135/70   Pulse 89   Temp 99.1 F (37.3 C) (Oral)   Resp 20   Ht 5\' 4"  (1.626 m)   Wt 113.4 kg   LMP 08/22/2015   SpO2 100%   BMI 42.91 kg/m   Physical Exam GEN: appears well, some distress from  pain in her backside Head: normocephalic and atraumatic  Eyes: conjunctiva without injection, sclera anicteric Ears: external ear and ear canal normal Nares: no rhinorrhea, congestion or erythema Oropharynx: mmm without erythema or exudation HEM: negative for cervical or periauricular lymphadenopathies CVS: RRR, nl s1 & s2, no murmurs, no edema, cap refills < 2 secs  RESP: speaks in full sentence, no IWOB, CTAB GI: BS present & normal, soft, NTND, no guarding, no rebound GU: no suprapubic or CVA tenderness MSK: no focal tenderness or notable swelling, no nuchal rigidity, no tenderness to palpation over her back SKIN: no apparent skin lesion NEURO: alert and oiented appropriately, no gross defecits  PSYCH: euthymic mood with congruent affect  ED Treatments / Results  Labs (all labs ordered are listed, but only abnormal results are displayed)  Labs Reviewed  INFLUENZA PANEL BY PCR (TYPE A & B) - Abnormal; Notable for the following:       Result Value   Influenza A By PCR POSITIVE (*)    All other components within normal limits  CBC WITH DIFFERENTIAL/PLATELET - Abnormal; Notable for the following:    WBC 3.0 (*)    Hemoglobin 11.5 (*)    HCT 35.7 (*)    All other components within normal limits  I-STAT CHEM 8, ED - Abnormal; Notable for the following:    BUN 21 (*)    Calcium, Ion 1.13 (*)    Hemoglobin 11.9 (*)    HCT 35.0 (*)    All other components within normal limits    EKG  EKG Interpretation None       Radiology Dg Chest 2 View  Result Date: 01/23/2017 CLINICAL DATA:  Patient reports falling out of an ambulance onto her "bottom", patient reports her "bottom" hurts, denies any hip pain at this time. Patient reports cough that just started today. HX diabetes, HTN EXAM: CHEST  2 VIEW COMPARISON:  03/15/2016 FINDINGS: Normal mediastinum and cardiac silhouette. Normal pulmonary vasculature. No evidence of effusion, infiltrate, or pneumothorax. No acute bony abnormality.  Degenerative osteophytosis of the spine. IMPRESSION: No acute cardiopulmonary process. Electronically Signed   By: Suzy Bouchard M.D.   On: 01/23/2017 09:01   Dg Hips Bilat W Or Wo Pelvis 3-4 Views  Result Date: 01/23/2017 CLINICAL DATA:  Fall EXAM: DG HIP (WITH OR WITHOUT PELVIS) 3-4V BILAT COMPARISON:  None. FINDINGS: No acute bony abnormality. Specifically, no fracture, subluxation, or dislocation. Soft tissues are intact. IMPRESSION: No acute bony abnormality. Electronically Signed   By: Rolm Baptise M.D.   On: 01/23/2017 09:02    Procedures Procedures (including critical care time)  Medications Ordered in ED Medications - No data to display   Initial Impression / Assessment and Plan / ED Course  I have reviewed the triage vital signs and the nursing notes.  Pertinent labs & imaging results that were available during my care of the patient were reviewed by me and considered in my medical decision making (see chart for details).  Patient with flu-like-illness for one day. Cardiopulmonary exam within normal limit. CXR negative for pneumonia. She has leukocytosis. Doubt PE without dyspnea, tachycardia or significant risk factor.Unliked ACS without chest pain or dyspnea. She is HDS. Blood glucose 95. Will treat patient for flu clinically. Discharged on Tamiflu and tessalon. Discussed return precautions. Will follow up on flu result  Fall: patient reports fall and ending up on her backside when she was trying to get off the ambulance in ED. She is sore over her backside but no apparent injury. She is moving her legs well. X-ray of her hip negative for fracture. She is on Tylenol-3 at home. Recommended heat or ice as well  Influenza PCR positive for Influenza A  Final Clinical Impressions(s) / ED Diagnoses   Final diagnoses:  Cough  Influenza-like illness    New Prescriptions Discharge Medication List as of 01/23/2017 10:54 AM    START taking these medications   Details    oseltamivir (TAMIFLU) 75 MG capsule Take 1 capsule (75 mg total) by mouth 2 (two) times daily., Starting Sun 01/23/2017, Print         Mercy Riding, MD 01/23/17 1742    Gareth Morgan, MD 01/26/17 1016

## 2017-01-23 NOTE — ED Triage Notes (Signed)
Per EMS- pt reports cold like symptoms x 1 day. Pt was reported to fall. Pt was ambulatory without difficulty at home.

## 2017-03-22 ENCOUNTER — Ambulatory Visit (INDEPENDENT_AMBULATORY_CARE_PROVIDER_SITE_OTHER): Payer: Medicaid Other | Admitting: Podiatry

## 2017-03-22 VITALS — BP 156/85 | HR 69

## 2017-03-22 DIAGNOSIS — B351 Tinea unguium: Secondary | ICD-10-CM | POA: Diagnosis not present

## 2017-03-22 DIAGNOSIS — M79672 Pain in left foot: Secondary | ICD-10-CM

## 2017-03-22 DIAGNOSIS — M79671 Pain in right foot: Secondary | ICD-10-CM

## 2017-03-22 DIAGNOSIS — L97521 Non-pressure chronic ulcer of other part of left foot limited to breakdown of skin: Secondary | ICD-10-CM | POA: Diagnosis not present

## 2017-03-22 DIAGNOSIS — M79606 Pain in leg, unspecified: Secondary | ICD-10-CM

## 2017-03-22 NOTE — Patient Instructions (Signed)
Seen for hypertrophic nails and painful digital corn 3rd left. All nails and corns debrided. Buttress pad placed under 3rd left. Return in 3 months or sooner if needed.

## 2017-03-22 NOTE — Progress Notes (Signed)
Subjective: 56 year old female presents requesting toe nails trimmed. 3rd toe left foot is hurting from a build up digital corn at the end. Her last visit to the office was 7 months ago. She is back in GYM and doing well. Thick hard nails hurt to walk. Her blood sugar is well under control. She is have more feelings in her left foot.   Objective: Thick dystrophic nails x 10. Pre ulcerative digital corn distal end 3rd digit left foot. All pedal pulses are palpable. Contracted digits 3rd bilateral.  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 Dystrophic nails x 10. Pain in lower limb with thick nails.   Plan: Debrided all nails. Left foot pre ulcerative lesion debrided and buttress pad placed. Return in 3 months or sooner if needed.

## 2017-03-23 ENCOUNTER — Encounter: Payer: Self-pay | Admitting: Podiatry

## 2017-03-24 ENCOUNTER — Ambulatory Visit (INDEPENDENT_AMBULATORY_CARE_PROVIDER_SITE_OTHER): Payer: Medicaid Other | Admitting: Orthopedic Surgery

## 2017-03-24 ENCOUNTER — Encounter (INDEPENDENT_AMBULATORY_CARE_PROVIDER_SITE_OTHER): Payer: Self-pay | Admitting: Orthopedic Surgery

## 2017-03-24 DIAGNOSIS — Z96652 Presence of left artificial knee joint: Secondary | ICD-10-CM | POA: Diagnosis not present

## 2017-03-24 NOTE — Progress Notes (Signed)
Office Visit Note   Patient: Kaitlyn Castillo           Date of Birth: [birth year]           MRN: [id number] Visit Date: 03/24/2017 Requested by: Benito Mccreedy, MD [address] [place], [place] [address] PCP: Benito Mccreedy, MD  Subjective: Chief Complaint  Patient presents with  . Knee Problem    HPI: Kaitlyn Castillo is a 56 year old female who underwent left total knee replacement 08/13/2014.  She's done well with that.  She also underwent right knee quad repair 2 which had partial failure because of her diabetes.  She's been very active in her exercising.  She does a lot of water exercises.  She wants to start using weights.  Discussed with her extensively about that prospect.  She spends 6 days a week in the gym.  Prior to her surgeries she was doing leg press 250 pounds and squats 100 pounds.              ROS: All systems reviewed are negative as they relate to the chief complaint within the history of present illness.  Patient denies  fevers or chills.   Assessment & Plan: Visit Diagnoses:  1. History of total knee arthroplasty, left     Plan: Impression is well-functioning left total knee replacement with no effusion and great range of motion.  On the right-hand side she still has a 20 extensor lag but is very functional with her VMO and vastus lateralis.  Part of the central quad tendon has detached as was the case at her last clinic visit.  After 2 attempts I don't think this is repairable.  Plan at this time is for resumption of gradual leg press leg curl abduction and adduction exercises.  No significant contraindication to that regimen at this time.  I would not want her doing the same weights she was doing prior to these injurie.  I'll see her back as needed need for dental prophylaxis discussed  Follow-Up Instructions: Return if symptoms worsen or fail to improve.   Orders:  No orders of the defined types were placed in this encounter.  No orders of the  defined types were placed in this encounter.     Procedures: No procedures performed   Clinical Data: No additional findings.  Objective: Vital Signs: LMP 08/22/2015   Physical Exam:   Constitutional: Patient appears well-developed HEENT:  Head: Normocephalic Eyes:EOM are normal Neck: Normal range of motion Cardiovascular: Normal rate Pulmonary/chest: Effort normal Neurologic: Patient is alert Skin: Skin is warm Psychiatric: Patient has normal mood and affect    Ortho Exam: Orthopedic exam demonstrates normal gait alignment with no assistive devices.  She has excellent range of motion the left knee from full extension to 120 of flexion collaterals are stable and there is no effusion.  On the right-hand side patient has about a 15 extensor lag with well-functioning vastus lateralis and VMO but the rectus and part of the quad tendon has detached proximally from the superior aspect of the patella.  This is not a new finding.  Specialty Comments:  No specialty comments available.  Imaging: No results found.   PMFS History: Patient Active Problem List   Diagnosis Date Noted  . Postmenopausal bleeding 08/27/2015  . Adenomyosis 08/27/2015  . Skin ulcer of left foot including toes ([facility]) 05/02/2015  . Equinus deformity of foot, acquired 04/02/2015  . Tenosynovitis of left foot 04/02/2015  . Arthritis of knee 08/13/2014  .  Pain in lower limb 04/24/2014  . Onychomycosis 11/02/2013  . Pain, foot 11/02/2013  . Acquired deformity of toe 11/02/2013  . Unspecified venous (peripheral) insufficiency 09/13/2012  . Cellulitis of left leg 07/07/2012  . ARF (acute renal failure) ([facility]) 07/07/2012  . LEG PAIN, LEFT 01/10/2008  . ANXIETY 11/19/2007  . DIABETIC FOOT ULCER, TOE 11/17/2007  . CHEST PAIN UNSPECIFIED 11/01/2007  . DIABETES MELLITUS, TYPE II 07/29/2007  . DEPRESSION 07/29/2007  . HYPERTENSION 07/29/2007   Past Medical History:  Diagnosis Date  . Arthritis   .  Constipation   . Diabetes mellitus   . Head injury, closed, with brief LOC ([facility])   . High cholesterol   . Hypertension   . Knee pain, right   . Obese   . Shortness of breath    with too much fluid- not currently    Family History  Problem Relation Age of Onset  . Diabetes Mother   . Hyperlipidemia Mother   . Hypertension Mother   . Diabetes Father   . Hyperlipidemia Father   . Hypertension Father   . Cancer Brother   . Heart disease Brother   . Hypertension Brother   . Heart attack Brother     Past Surgical History:  Procedure Laterality Date  . carbuncle     back of head  . CESAREAN SECTION    . COLONOSCOPY    . KNEE SURGERY     quad  . QUADRICEPS REPAIR    . TOTAL KNEE ARTHROPLASTY Left 08/13/2014   Procedure: TOTAL KNEE ARTHROPLASTY;  [place]: Meredith Pel, MD;  Location: [facility];  Service: Orthopedics;  Laterality: Left;   Social History   Occupational History  . Not on file.   Social History Main Topics  . Smoking status: Former Smoker    Years: 0.00    Types: Cigarettes    Quit date: 12/13/1978  . Smokeless tobacco: Never Used  . Alcohol use No  . Drug use: No  . Sexual activity: Not on file

## 2017-04-05 ENCOUNTER — Ambulatory Visit: Payer: Medicaid Other | Admitting: Podiatry

## 2017-06-21 ENCOUNTER — Ambulatory Visit: Payer: Medicaid Other | Admitting: Podiatry

## 2017-08-11 ENCOUNTER — Ambulatory Visit (INDEPENDENT_AMBULATORY_CARE_PROVIDER_SITE_OTHER): Payer: Medicaid Other | Admitting: Orthopedic Surgery

## 2017-08-11 ENCOUNTER — Ambulatory Visit (INDEPENDENT_AMBULATORY_CARE_PROVIDER_SITE_OTHER): Payer: Medicaid Other

## 2017-08-11 ENCOUNTER — Encounter (INDEPENDENT_AMBULATORY_CARE_PROVIDER_SITE_OTHER): Payer: Self-pay | Admitting: Orthopedic Surgery

## 2017-08-11 DIAGNOSIS — M25562 Pain in left knee: Secondary | ICD-10-CM

## 2017-08-11 DIAGNOSIS — M25552 Pain in left hip: Secondary | ICD-10-CM | POA: Diagnosis not present

## 2017-08-11 MED ORDER — ACETAMINOPHEN-CODEINE #3 300-30 MG PO TABS: 1.0000 | ORAL_TABLET | Freq: Three times a day (TID) | ORAL | 0 refills | Status: DC | PRN

## 2017-08-14 NOTE — Progress Notes (Signed)
Office Visit Note   Patient: Kaitlyn Castillo           Date of Birth: 03-30-1961           MRN: [id number] Visit Date: 08/11/2017 Requested by: Benito Mccreedy, MD [address] [place], [place] [address] PCP: Benito Mccreedy, MD  Subjective: Chief Complaint  Patient presents with  . Left Knee - Pain  . Left Hip - Pain    HPI: Kaitlyn Castillo is a 56 year old patient with left hip and knee pain.  Has had acute onset of groin pain radiating down to the medial knee for the past 3 days.  Describes extreme pain.  She has been doing a lot of working out.  This involves abduction and adduction machines.  She states she is unable to sit in a low chair without difficulty getting up.  Denies any fevers and chills.  Does report some buttock pain.  She states that she can't go upstairs.  She is on Voltaren.  She [name] went to the gym today.              ROS: All systems reviewed are negative as they relate to the chief complaint within the history of present illness.  Patient denies  fevers or chills.   Assessment & Plan: Visit Diagnoses:  1. Left knee pain, unspecified chronicity   2. Pain in left hip     Plan: Impression is left hip and knee pain with normal radiographs of the knee and normal examination of the knee.  This likely may be a muscle tear or muscle pull related to some of the machines she is using when working out.  Twisting is painful for her.  We will try her on Tylenol 3 one prescription only and then she'll call next week if her symptoms are improved.  We may need to consider MRI scanning of the hip at that time.  No fevers and chills I don't think this is infection.  Neither do I think this is referred pain from the back that she's having no nerve root tension signs.  Follow-Up Instructions: No Follow-up on file.   Orders:  Orders Placed This Encounter  Procedures  . XR Knee 1-2 Views Left  . XR HIP UNILAT W OR W/O PELVIS 2-3 VIEWS LEFT   Meds ordered this  encounter  Medications  . acetaminophen-codeine (TYLENOL #3) 300-30 MG tablet    Sig: Take 1 tablet by mouth every 8 (eight) hours as needed for moderate pain.    Dispense:  30 tablet    Refill:  0      Procedures: No procedures performed   Clinical Data: No additional findings.  Objective: Vital Signs: LMP 08/22/2015   Physical Exam:   Constitutional: Patient appears well-developed HEENT:  Head: Normocephalic Eyes:EOM are normal Neck: Normal range of motion Cardiovascular: Normal rate Pulmonary/chest: Effort normal Neurologic: Patient is alert Skin: Skin is warm Psychiatric: Patient has normal mood and affect    Ortho Exam: Orthopedic exam demonstrates antalgic gait to the left.  Palpable pedal pulses.  Hip has some pain with range of motion internal/external rotation.  There is no definite trochanteric tenderness.  A little bit of pain with resisted abduction and adduction on the left-hand side.  She has reasonable hip flexion strength.  Knee has no effusion intact extensor mechanism and palpable pedal pulses with no warmth.  No nerve retention signs on the left or right.  No real pain with forward or lateral bending.  Specialty Comments:  No specialty comments available.  Imaging: No results found.   PMFS History: Patient Active Problem List   Diagnosis Date Noted  . Postmenopausal bleeding 08/27/2015  . Adenomyosis 08/27/2015  . Skin ulcer of left foot including toes ([facility]) 05/02/2015  . Equinus deformity of foot, acquired 04/02/2015  . Tenosynovitis of left foot 04/02/2015  . Arthritis of knee 08/13/2014  . Pain in lower limb 04/24/2014  . Onychomycosis 11/02/2013  . Pain, foot 11/02/2013  . Acquired deformity of toe 11/02/2013  . Unspecified venous (peripheral) insufficiency 09/13/2012  . Cellulitis of left leg 07/07/2012  . ARF (acute renal failure) ([facility]) 07/07/2012  . LEG PAIN, LEFT 01/10/2008  . ANXIETY 11/19/2007  . DIABETIC FOOT ULCER, TOE  11/17/2007  . CHEST PAIN UNSPECIFIED 11/01/2007  . DIABETES MELLITUS, TYPE II 07/29/2007  . DEPRESSION 07/29/2007  . HYPERTENSION 07/29/2007   Past Medical History:  Diagnosis Date  . Arthritis   . Constipation   . Diabetes mellitus   . Head injury, closed, with brief LOC ([facility])   . High cholesterol   . Hypertension   . Knee pain, right   . Obese   . Shortness of breath    with too much fluid- not currently    Family History  Problem Relation Age of Onset  . Diabetes Mother   . Hyperlipidemia Mother   . Hypertension Mother   . Diabetes Father   . Hyperlipidemia Father   . Hypertension Father   . Cancer Brother   . Heart disease Brother   . Hypertension Brother   . Heart attack Brother     Past Surgical History:  Procedure Laterality Date  . carbuncle     back of head  . CESAREAN SECTION    . COLONOSCOPY    . KNEE SURGERY     quad  . QUADRICEPS REPAIR    . TOTAL KNEE ARTHROPLASTY Left 08/13/2014   Procedure: TOTAL KNEE ARTHROPLASTY;  [place]: Meredith Pel, MD;  Location: [facility];  Service: Orthopedics;  Laterality: Left;   Social History   Occupational History  . Not on file.   Social History Main Topics  . Smoking status: Former Smoker    Years: 0.00    Types: Cigarettes    Quit date: 12/13/1978  . Smokeless tobacco: Never Used  . Alcohol use No  . Drug use: No  . Sexual activity: Not on file

## 2017-08-16 ENCOUNTER — Telehealth (INDEPENDENT_AMBULATORY_CARE_PROVIDER_SITE_OTHER): Payer: Self-pay | Admitting: Radiology

## 2017-08-16 DIAGNOSIS — M25552 Pain in left hip: Secondary | ICD-10-CM

## 2017-08-16 NOTE — Telephone Encounter (Signed)
Patient was seen in the office last week. Calling back to advise she is still in severe amount of pain, even after taking medication. She was advised by Dr. Marlou Sa to call back if she was not showing improvement, and he would go ahead and order MRI of right hip. Patient would like to proceed with this. I advised her over the phone that we would still need to obtain insurance approval for MRI before she could be scheduled. She expressed understanding. CB if you have questions [phone].

## 2017-08-16 NOTE — Addendum Note (Signed)
Addended by: Laurann Montana on: 08/16/2017 12:44 PM   Modules accepted: Orders

## 2017-08-16 NOTE — Telephone Encounter (Signed)
Mri order submitted.

## 2017-08-18 ENCOUNTER — Telehealth (INDEPENDENT_AMBULATORY_CARE_PROVIDER_SITE_OTHER): Payer: Self-pay

## 2017-08-18 NOTE — Telephone Encounter (Signed)
Patient called states she has reached her level of pain. She states she still hasn't heard anything about her MRI and she needs to know what to do.

## 2017-08-18 NOTE — Telephone Encounter (Signed)
Forwarding to you for help..... Patient refused medication. I talked with patient at length. Apologized for the delay in getting her scheduled for a scan. She asked if she needed to call her PCP to get this expedited. She said she has never waited this long for a referral. 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 Is there anything that we can do for her? I advised was sending information to my supervisor to see if there was anything we could do to speed up process for her.

## 2017-08-18 NOTE — Telephone Encounter (Signed)
We already have scan ordered for left hip. It is just pending auth from insurance. Patient wanted to know what she could for for pain in meantime.

## 2017-08-18 NOTE — Telephone Encounter (Signed)
Ok for t 3 1 po q 8 # 30 pls clal htx

## 2017-08-18 NOTE — Telephone Encounter (Signed)
Ok for left pelvis mri to eval for left hip occult fracture or muscle tear pls call thx

## 2017-08-18 NOTE — Telephone Encounter (Signed)
Any suggestions for pain while waiting for patients MRI to be authorized by her insurance and scheduled?

## 2017-08-19 NOTE — Telephone Encounter (Signed)
MRI is now authorized and schedule per chart.

## 2017-08-20 ENCOUNTER — Emergency Department (HOSPITAL_COMMUNITY)
Admission: EM | Admit: 2017-08-20 | Discharge: 2017-08-21 | Disposition: A | Discharge status: Home / Self Care | Payer: Medicaid Other | Attending: Emergency Medicine | Admitting: Emergency Medicine

## 2017-08-20 ENCOUNTER — Encounter (HOSPITAL_COMMUNITY): Payer: Self-pay | Admitting: Emergency Medicine

## 2017-08-20 DIAGNOSIS — E119 Type 2 diabetes mellitus without complications: Secondary | ICD-10-CM | POA: Insufficient documentation

## 2017-08-20 DIAGNOSIS — Z87891 Personal history of nicotine dependence: Secondary | ICD-10-CM | POA: Insufficient documentation

## 2017-08-20 DIAGNOSIS — I10 Essential (primary) hypertension: Secondary | ICD-10-CM | POA: Insufficient documentation

## 2017-08-20 DIAGNOSIS — Z7984 Long term (current) use of oral hypoglycemic drugs: Secondary | ICD-10-CM | POA: Diagnosis not present

## 2017-08-20 DIAGNOSIS — M25552 Pain in left hip: Secondary | ICD-10-CM

## 2017-08-20 NOTE — ED Provider Notes (Signed)
[facility] Provider Note   CSN: [id number] Arrival date & time: 08/20/17  1923     History   Chief Complaint Chief Complaint  Patient presents with  . Hip Pain    HPI Kaitlyn Castillo is a 56 y.o. female presenting with severe left hip pain keeping her from walking on it. She was evaluated by orthopedics who had ordered plain film and ordered MRI scheduled for a week from now. She has been Castillo Tylenol 3 and ibuprofen without relief. She explains that it takes the edge off a little bit. Her pain is exacerbated by attempting to stand up from a sitting position or walking. Relieved by remaining still. She explains that she has had two 10 pound baby's without epidural or pain medication and this is worse. She states that she doesn't recall a specific injury, that she does a lot of weight lifting. He first noticed the pain after exercise. She reports deep groin pain, that moves along her anterior thigh to the medial knee. States that she's had a full knee replacement on the left. She denies numbness, weakness, swelling, erythema, bulging mass in her groin, fever, chills or other symptoms. She has just taken her home medications while in the emergency department and declines muscle relaxer or pain medications at this time.  HPI  Past Medical History:  Diagnosis Date  . Arthritis   . Constipation   . Diabetes mellitus   . Head injury, closed, with brief LOC ([facility])   . High cholesterol   . Hypertension   . Knee pain, right   . Obese   . Shortness of breath    with too much fluid- not currently    Patient Active Problem List   Diagnosis Date Noted  . Postmenopausal bleeding 08/27/2015  . Adenomyosis 08/27/2015  . Skin ulcer of left foot including toes ([facility]) 05/02/2015  . Equinus deformity of foot, acquired 04/02/2015  . Tenosynovitis of left foot 04/02/2015  . Arthritis of knee 08/13/2014  . Pain in lower limb 04/24/2014  . Onychomycosis 11/02/2013  . Pain, foot  11/02/2013  . Acquired deformity of toe 11/02/2013  . Unspecified venous (peripheral) insufficiency 09/13/2012  . Cellulitis of left leg 07/07/2012  . ARF (acute renal failure) ([facility]) 07/07/2012  . LEG PAIN, LEFT 01/10/2008  . ANXIETY 11/19/2007  . DIABETIC FOOT ULCER, TOE 11/17/2007  . CHEST PAIN UNSPECIFIED 11/01/2007  . DIABETES MELLITUS, TYPE II 07/29/2007  . DEPRESSION 07/29/2007  . HYPERTENSION 07/29/2007    Past Surgical History:  Procedure Laterality Date  . carbuncle     back of head  . CESAREAN SECTION    . COLONOSCOPY    . KNEE SURGERY     quad  . QUADRICEPS REPAIR    . TOTAL KNEE ARTHROPLASTY Left 08/13/2014   Procedure: TOTAL KNEE ARTHROPLASTY;  [place]: Meredith Pel, MD;  Location: [facility];  Service: Orthopedics;  Laterality: Left;    OB History    Gravida Para Term Preterm AB Living   8 4 4   4 4    SAB TAB Ectopic Multiple Live Births     4     4       Home Medications    Prior to Admission medications   Medication Sig Start Date End Date Castillo? Authorizing Provider  acetaminophen-codeine (TYLENOL #3) 300-30 MG per tablet Take 1-2 tablets by mouth every 6 (six) hours as needed. Patient not Castillo: Reported on 04/11/2016 08/25/15   Luvenia Redden, PA-C  acetaminophen-codeine (TYLENOL #3) 300-30 MG tablet Take 1 tablet by mouth every 8 (eight) hours as needed for moderate pain. 08/11/17   Meredith Pel, MD  benzonatate (TESSALON) 100 MG capsule Take 1 capsule (100 mg total) by mouth every 8 (eight) hours. 01/23/17   Mercy Riding, MD  Carbamide Peroxide (EAR WAX DROPS OT) Place 3-4 drops into both ears as needed (for ear wax).    [provider]  exenatide (BYETTA 10 MCG PEN) 10 MCG/0.04ML SOPN injection Inject 10 mcg into the skin 2 (two) times daily with a meal.    [provider]  glipiZIDE-metformin (METAGLIP) 5-500 MG per tablet Take 1 tablet by mouth 2 (two) times daily.     [provider]    lisinopril-hydrochlorothiazide (PRINZIDE,ZESTORETIC) 20-25 MG per tablet Take 1 tablet by mouth daily.     [provider]  megestrol (MEGACE) 40 MG tablet Take 1 tablet (40 mg total) by mouth 2 (two) times daily. Patient not Castillo: Reported on 04/11/2016 08/25/15   Luvenia Redden, PA-C  methocarbamol (ROBAXIN) 500 MG tablet Take 1 tablet (500 mg total) by mouth at bedtime as needed for muscle spasms. 08/21/17   Avie Echevaria B, PA-C  Omega-3 Fatty Acids (FISH OIL PO) Take 1 capsule by mouth daily.    [provider]  oseltamivir (TAMIFLU) 75 MG capsule Take 1 capsule (75 mg total) by mouth 2 (two) times daily. 01/23/17   Mercy Riding, MD    Family History Family History  Problem Relation Age of Onset  . Diabetes Mother   . Hyperlipidemia Mother   . Hypertension Mother   . Diabetes Father   . Hyperlipidemia Father   . Hypertension Father   . Cancer Brother   . Heart disease Brother   . Hypertension Brother   . Heart attack Brother     Social History Social History  Substance Use Topics  . Smoking status: Former Smoker    Years: 0.00    Types: Cigarettes    Quit date: 12/13/1978  . Smokeless tobacco: Never Used  . Alcohol use No     Allergies   Aleve [naproxen sodium]; Iodine; Iohexol; and Percocet [oxycodone-acetaminophen]   Review of Systems Review of Systems  Constitutional: Negative for chills and fever.  Gastrointestinal: Negative for nausea and vomiting.  Musculoskeletal: Positive for arthralgias, gait problem and myalgias. Negative for back pain and joint swelling.  Skin: Negative for color change, pallor, rash and wound.  Neurological: Negative for weakness and numbness.     Physical Exam Updated Vital Signs BP 134/76 (BP Location: Right Arm)   Pulse 76   Temp 98.4 F (36.9 C) (Oral)   Resp 18   Ht 5\' 4"  (1.626 m)   Wt 101.2 kg (223 lb)   LMP 08/22/2015   SpO2 100%   BMI 38.28 kg/m   Physical Exam  Constitutional: She appears  well-developed and well-nourished. No distress.  Afebrile, nontoxic-appearing, sitting on the edge of the bed in apparent discomfort.  HENT:  Head: Normocephalic and atraumatic.  Eyes: Conjunctivae are normal.  Neck: Neck supple.  Cardiovascular: Normal rate, regular rhythm, normal heart sounds and intact distal pulses.   Pulmonary/Chest: Effort normal and breath sounds normal. No respiratory distress. She has no wheezes. She has no rales.  Abdominal: No hernia.  Musculoskeletal: Normal range of motion. She exhibits tenderness. She exhibits no edema or deformity.  Some tenderness palpation of the gluteal muscle on the left. Hip is nontender to  palpation, no tenderness palpation of the knee. No mass appreciated in the anterior thigh or inguinal region.  Neurological: She is alert. No sensory deficit. She exhibits normal muscle tone.  Unable to fully assess strength at hip flexion as patient cannot flex at the hip due to severe pain. 5 out of 5 strength to knee flexion and extension and plantar flexion dorsiflexion bilaterally. Sensation intact. Neurovascularly intact distally  Skin: Skin is warm and dry. Capillary refill takes less than 2 seconds. No rash noted. She is not diaphoretic. No erythema. No pallor.  Psychiatric: She has a normal mood and affect.  Nursing note and vitals reviewed.    ED Treatments / Results  Labs (all labs ordered are listed, but only abnormal results are displayed) Labs Reviewed - No data to display  EKG  EKG Interpretation None       Radiology No results found.  Procedures Procedures (including critical care time)  Medications Ordered in ED Medications - No data to display   Initial Impression / Assessment and Plan / ED Course  I have reviewed the triage vital signs and the nursing notes.  Pertinent labs & imaging results that were available during my care of the patient were reviewed by me and considered in my medical decision making (see  chart for details).    Patient presenting with difficulty walking and poorly managed pain in the left hip without known injury.  MRI had been ordered by orthopedics for a week from now but patient's symptoms were too severe and she is having difficulty walking.  MRI left hip: pending  Patient's pain was managed in the emergency department and she reported controlled pain if she did not move. She had already taken her home medications in the emergency department.  Transferred patient care at end of shift to Montine Circle, PA-C pending left hip MRI reading. Anticipate discharge home with muscle relaxant, possibly crutches and close follow-up with Dr. Nicki Reaper orthopedics.   Final Clinical Impressions(s) / ED Diagnoses   Final diagnoses:  Left hip pain    New Prescriptions New Prescriptions   METHOCARBAMOL (ROBAXIN) 500 MG TABLET    Take 1 tablet (500 mg total) by mouth at bedtime as needed for muscle spasms.     Emeline General, PA-C 08/21/17 0174    Merrily Pew, MD 08/22/17 [phone]

## 2017-08-20 NOTE — ED Triage Notes (Signed)
Reports having pain in right hip for over a week.  Reports she hasn't injured the hip but does lift weights and may have done something lifting.  Taking tylenol #3 and ibuprofen with no help.  Was supposed to have MRI next Sunday.  States I can't make it that long.  Slow steady gait noted.

## 2017-08-21 ENCOUNTER — Emergency Department (HOSPITAL_COMMUNITY): Payer: Medicaid Other

## 2017-08-21 MED ORDER — METHOCARBAMOL 500 MG PO TABS: 500.0000 mg | ORAL_TABLET | Freq: Every evening | ORAL | 0 refills | Status: DC | PRN

## 2017-08-21 NOTE — ED Notes (Signed)
Patient transported to MRI 

## 2017-08-21 NOTE — ED Provider Notes (Signed)
MRI prelim report is:   1.  no significant hip effusion 2. no marrow edema to suggest fracture or infection of femur 3. mild edema left ilium abutting si joint ? inflammatory / arthritis 4.  small cysts right acetabulum 5.  heterogenous T1 marrow signal, ? marrow reconversion  6.  formal report to follow.   K[facility]  Recommend ortho follow-up.   Montine Circle, PA-C 08/21/17 [phone]

## 2017-08-21 NOTE — Discharge Instructions (Signed)
As discussed, the muscle relaxant prescribed cannot be taken if driving or operating machinery.  Continue with your previously prescribed pain medications, apply heat to the affected area and follow up with Dr. Nicki Reaper in office.  Return if symptoms worsen or you experience new concerning symptoms in the meantime, loss of bowel or bladder function, numbness, weakness in your lower extremity or any other new concerning symptoms in the meanitme.

## 2017-08-22 ENCOUNTER — Telehealth (INDEPENDENT_AMBULATORY_CARE_PROVIDER_SITE_OTHER): Payer: Self-pay | Admitting: Orthopedic Surgery

## 2017-08-22 NOTE — Telephone Encounter (Signed)
Patient had her MRI Saturday and was needing to know the results. CB # [phone]

## 2017-08-22 NOTE — Telephone Encounter (Signed)
Please advise thanks.

## 2017-08-22 NOTE — Telephone Encounter (Signed)
Muscle strain no fx pls clal thx

## 2017-08-22 NOTE — Telephone Encounter (Signed)
IC s/w patient and advised  

## 2017-08-28 ENCOUNTER — Other Ambulatory Visit: Payer: Self-pay

## 2017-09-14 ENCOUNTER — Ambulatory Visit (INDEPENDENT_AMBULATORY_CARE_PROVIDER_SITE_OTHER): Payer: Medicaid Other | Admitting: Podiatry

## 2017-09-14 ENCOUNTER — Encounter: Payer: Self-pay | Admitting: Podiatry

## 2017-09-14 DIAGNOSIS — M79672 Pain in left foot: Secondary | ICD-10-CM

## 2017-09-14 DIAGNOSIS — B351 Tinea unguium: Secondary | ICD-10-CM

## 2017-09-14 DIAGNOSIS — M79671 Pain in right foot: Secondary | ICD-10-CM

## 2017-09-14 NOTE — Progress Notes (Signed)
Subjective: 56 year old female presents requesting toe nails trimmed. Nails are thick and hurts to walk in closed in shoes. She has had strained hip ligament about a month ago and having difficulty moving her left lower limb.  She is back in GYM and doing well with weight lifting and swimming.  Her blood sugar is well under control.  Objective: Thick dystrophic nails x 10. Digital corn distal end 3rd digit left foot. All pedal pulses are palpable. Contracted digits 3rd bilateral.  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 Dystrophic nails x 10. Pain in lower limb with thick nails.   Plan: Debrided all nails and digital corn. Return in 3 months or sooner if needed.

## 2017-09-14 NOTE — Patient Instructions (Signed)
Seen for hypertrophic nails. All nails and calluses debrided. Return in 3 months or as needed.  

## 2017-12-01 ENCOUNTER — Encounter (HOSPITAL_COMMUNITY): Payer: Self-pay | Admitting: Pharmacy Technician

## 2017-12-01 ENCOUNTER — Emergency Department (HOSPITAL_COMMUNITY)
Admission: EM | Admit: 2017-12-01 | Discharge: 2017-12-01 | Disposition: A | Discharge status: Home / Self Care | Payer: Medicaid Other | Attending: Emergency Medicine | Admitting: Emergency Medicine

## 2017-12-01 ENCOUNTER — Emergency Department (HOSPITAL_COMMUNITY): Payer: Medicaid Other

## 2017-12-01 DIAGNOSIS — E119 Type 2 diabetes mellitus without complications: Secondary | ICD-10-CM | POA: Insufficient documentation

## 2017-12-01 DIAGNOSIS — Z7984 Long term (current) use of oral hypoglycemic drugs: Secondary | ICD-10-CM | POA: Diagnosis not present

## 2017-12-01 DIAGNOSIS — H6121 Impacted cerumen, right ear: Secondary | ICD-10-CM | POA: Insufficient documentation

## 2017-12-01 DIAGNOSIS — W92XXXA Exposure to excessive heat of man-made origin, initial encounter: Secondary | ICD-10-CM | POA: Diagnosis not present

## 2017-12-01 DIAGNOSIS — R52 Pain, unspecified: Secondary | ICD-10-CM | POA: Insufficient documentation

## 2017-12-01 DIAGNOSIS — R509 Fever, unspecified: Secondary | ICD-10-CM | POA: Insufficient documentation

## 2017-12-01 DIAGNOSIS — Z79899 Other long term (current) drug therapy: Secondary | ICD-10-CM | POA: Diagnosis not present

## 2017-12-01 DIAGNOSIS — T679XXA Effect of heat and light, unspecified, initial encounter: Secondary | ICD-10-CM

## 2017-12-01 DIAGNOSIS — Z87891 Personal history of nicotine dependence: Secondary | ICD-10-CM | POA: Insufficient documentation

## 2017-12-01 DIAGNOSIS — I10 Essential (primary) hypertension: Secondary | ICD-10-CM | POA: Diagnosis not present

## 2017-12-01 DIAGNOSIS — D649 Anemia, unspecified: Secondary | ICD-10-CM | POA: Insufficient documentation

## 2017-12-01 DIAGNOSIS — M6282 Rhabdomyolysis: Secondary | ICD-10-CM | POA: Insufficient documentation

## 2017-12-01 DIAGNOSIS — E7800 Pure hypercholesterolemia, unspecified: Secondary | ICD-10-CM | POA: Insufficient documentation

## 2017-12-01 DIAGNOSIS — T796XXA Traumatic ischemia of muscle, initial encounter: Secondary | ICD-10-CM

## 2017-12-01 DIAGNOSIS — R748 Abnormal levels of other serum enzymes: Secondary | ICD-10-CM

## 2017-12-01 LAB — COMPREHENSIVE METABOLIC PANEL
ALK PHOS: 41 U/L (ref 38–126)
ALT: 29 U/L (ref 14–54)
AST: 31 U/L (ref 15–41)
Albumin: 3.8 g/dL (ref 3.5–5.0)
Anion gap: 7 (ref 5–15)
BUN: 20 mg/dL (ref 6–20)
CALCIUM: 9.1 mg/dL (ref 8.9–10.3)
CHLORIDE: 101 mmol/L (ref 101–111)
CO2: 26 mmol/L (ref 22–32)
Creatinine, Ser: 1.03 mg/dL — ABNORMAL HIGH (ref 0.44–1.00)
GFR calc Af Amer: >60 mL/min (ref >60)
GFR, EST NON AFRICAN AMERICAN: 60 mL/min — AB (ref >60)
Glucose, Bld: 152 mg/dL — ABNORMAL HIGH (ref 65–99)
Potassium: 3.6 mmol/L (ref 3.5–5.1)
Sodium: 134 mmol/L — ABNORMAL LOW (ref 135–145)
Total Bilirubin: 0.8 mg/dL (ref 0.3–1.2)
Total Protein: 7.2 g/dL (ref 6.5–8.1)

## 2017-12-01 LAB — URINALYSIS, ROUTINE W REFLEX MICROSCOPIC
BILIRUBIN URINE: NEGATIVE
Glucose, UA: NEGATIVE mg/dL
Hgb urine dipstick: NEGATIVE
Ketones, ur: NEGATIVE mg/dL
Leukocytes, UA: NEGATIVE
Nitrite: NEGATIVE
PH: 7 (ref 5.0–8.0)
Protein, ur: NEGATIVE mg/dL
SPECIFIC GRAVITY, URINE: 1.014 (ref 1.005–1.030)

## 2017-12-01 LAB — CBC WITH DIFFERENTIAL/PLATELET
BASOS ABS: 0 10*3/uL (ref 0.0–0.1)
Basophils Relative: 0 %
EOS PCT: 1 %
Eosinophils Absolute: 0 10*3/uL (ref 0.0–0.7)
HEMATOCRIT: 35.1 % — AB (ref 36.0–46.0)
HEMOGLOBIN: 11.7 g/dL — AB (ref 12.0–15.0)
LYMPHS ABS: 0.7 10*3/uL (ref 0.7–4.0)
LYMPHS PCT: 12 %
MCH: 27.5 pg (ref 26.0–34.0)
MCHC: 33.3 g/dL (ref 30.0–36.0)
MCV: 82.4 fL (ref 78.0–100.0)
Monocytes Absolute: 0.3 10*3/uL (ref 0.1–1.0)
Monocytes Relative: 5 %
NEUTROS ABS: 5.1 10*3/uL (ref 1.7–7.7)
NEUTROS PCT: 82 %
PLATELETS: 231 10*3/uL (ref 150–400)
RBC: 4.26 MIL/uL (ref 3.87–5.11)
RDW: 14.7 % (ref 11.5–15.5)
WBC: 6.2 10*3/uL (ref 4.0–10.5)

## 2017-12-01 LAB — CK
CK TOTAL: 388 U/L — AB (ref 38–234)
CK TOTAL: 308 U/L — AB (ref 38–234)

## 2017-12-01 LAB — I-STAT CG4 LACTIC ACID, ED: LACTIC ACID, VENOUS: 0.96 mmol/L (ref 0.5–1.9)

## 2017-12-01 LAB — CBG MONITORING, ED: Glucose-Capillary: 159 mg/dL — ABNORMAL HIGH (ref 65–99)

## 2017-12-01 MED ORDER — IBUPROFEN 800 MG PO TABS
800.0000 mg | ORAL_TABLET | Freq: Once | ORAL | Status: AC
  Administered 2017-12-01: 800 mg via ORAL
  Filled 2017-12-01: qty 1

## 2017-12-01 MED ORDER — ACETAMINOPHEN 500 MG PO TABS
1000.0000 mg | ORAL_TABLET | Freq: Once | ORAL | Status: AC
  Administered 2017-12-01: 1000 mg via ORAL
  Filled 2017-12-01: qty 2

## 2017-12-01 MED ORDER — SODIUM CHLORIDE 0.9 % IV BOLUS (SEPSIS)
1000.0000 mL | Freq: Once | INTRAVENOUS | Status: AC
  Administered 2017-12-01: 1000 mL via INTRAVENOUS

## 2017-12-01 NOTE — H&P (Addendum)
Medical Consultation   Kaitlyn Castillo  BCW:[id number]  DOB: 10-21-1961  DOA: 12/01/2017  PCP: Benito Mccreedy, MD   Requesting physician: Dr. Ellender Hose   Reason for consultation: Hyperthermia    History of Present Illness: Kaitlyn Castillo is an 56 y.o. female with history of hypertension, type 2 diabetes mellitus, and chronic pain, now presenting to the emergency department for evaluation of chills.  Patient woke in her usual state of health, was having an uneventful day, went to the gym as she usually does, weightlifting, and then sat in a hot tub.  She then went to the sauna where she fell asleep for approximately 2 hours.  She was experiencing chills and malaise upon waking in the sauna and EMS was called for transport to the hospital.  There has not been any cough, dysuria, abdominal pain, meningismus, chest pain, or headache leading up to this.  She reports severe chills and mild headache upon EMS arrival.  On arrival to the emergency department, the patient is found to be febrile to 39.2 degree C, slightly tachycardic, and with vitals otherwise stable.  EKG features a sinus tachycardia with rate 109.  Chest x-ray is negative for acute cardiopulmonary disease.  Chemistry panel and CBC are unremarkable, lactic acid is reassuringly normal, urinalysis on room and serum CK slightly elevated to 388.  Blood and urine cultures were collected, 3 L normal saline was administered, patient was treated with Advil, acetaminophen, and cooling blankets.  Temperature has improved to 37.5 degree C, blood pressure remained stable, no arrhythmias on telemetry, serum CK is trending down, and the patient is in no apparent distress.   Review of Systems:  ROS As per HPI otherwise 10 point review of systems negative.   Past Medical History: Past Medical History:  Diagnosis Date  . Arthritis   . Constipation   . Diabetes mellitus   . Head injury, closed, with brief LOC ([place])   . High  cholesterol   . Hypertension   . Knee pain, right   . Obese   . Shortness of breath    with too much fluid- not currently    Past Surgical History: Past Surgical History:  Procedure Laterality Date  . carbuncle     back of head  . CESAREAN SECTION    . COLONOSCOPY    . KNEE SURGERY     quad  . QUADRICEPS REPAIR    . TOTAL KNEE ARTHROPLASTY Left 08/13/2014   Procedure: TOTAL KNEE ARTHROPLASTY;  Spinella: Meredith Pel, MD;  Location: [facility];  Service: Orthopedics;  Laterality: Left;     Allergies:   Allergies  Allergen Reactions  . Aleve [Naproxen Sodium] Hives  . Iodine Hives    Certain iodine in fish  . Iohexol Other (See Comments)     Code: HIVES, Desc: REPORTED HIVES, PT STATES SHE DOES FINE IF SHE HAS A BENADRYL FIRST, ARS 02/09/09   . Percocet [Oxycodone-Acetaminophen] Hives, Itching and Other (See Comments)    Paronoid  . Shellfish Allergy Other (See Comments)    Iodine breaks her out and "stops her breathing"     Social History:  reports that she quit smoking about 38 years ago. Her smoking use included cigarettes. She quit after 0.00 years of use. she has never used smokeless tobacco. She reports that she does not drink alcohol or use drugs.   Family History: Family History  Problem Relation Age of  Onset  . Diabetes Mother   . Hyperlipidemia Mother   . Hypertension Mother   . Diabetes Father   . Hyperlipidemia Father   . Hypertension Father   . Cancer Brother   . Heart disease Brother   . Hypertension Brother   . Heart attack Brother     Physical Exam: Vitals:   12/01/17 1801 12/01/17 1928 12/01/17 2011 12/01/17 2206  BP:  133/71    Pulse:  (!) 114    Resp:  (!) 23    Temp: (!) 101 F (38.3 C) (!) 101.5 F (38.6 C) (!) 101.6 F (38.7 C) 99.5 F (37.5 C)  TempSrc: Oral Oral Oral Rectal  SpO2:  98%      Constitutional: Alert and awake, oriented x3, not in any acute distress. Eyes: PERLA, EOMI, irises appear normal, anicteric sclera,    ENMT: external ears and nose appear normal, lips appears normal, oropharynx mucosa, tongue, posterior pharynx appear normal  Neck: neck appears normal, no masses, normal ROM, no thyromegaly, no JVD  CVS: S1-S2 clear, no murmur rubs or gallops, 1+ pretibial edema bilaterally, no JVD Respiratory:  clear to auscultation bilaterally, no wheezing, rales or rhonchi. Respiratory effort normal.    Abdomen: soft nontender, nondistended, normal bowel sounds, no hepatosplenomegaly, no hernias  Musculoskeletal: : no cyanosis, clubbing or edema noted bilaterally  Neuro: Cranial nerves II-XII intact, strength, sensation, reflexes Psych: judgement and insight appear normal, stable mood and affect, mental status Skin: no rashes or lesions or ulcers, no induration or nodules    Data reviewed:  I have personally reviewed following labs and imaging studies Labs:  CBC: Recent Labs  Lab 12/01/17 1454  WBC 6.2  NEUTROABS 5.1  HGB 11.7*  HCT 35.1*  MCV 82.4  PLT 962    Basic Metabolic Panel: Recent Labs  Lab 12/01/17 1454  NA 134*  K 3.6  CL 101  CO2 26  GLUCOSE 152*  BUN 20  CREATININE 1.03*  CALCIUM 9.1   GFR CrCl cannot be calculated (Unknown ideal weight.). Liver Function Tests: Recent Labs  Lab 12/01/17 1454  AST 31  ALT 29  ALKPHOS 41  BILITOT 0.8  PROT 7.2  ALBUMIN 3.8   No results for input(s): LIPASE, AMYLASE in the last 168 hours. No results for input(s): AMMONIA in the last 168 hours. Coagulation profile No results for input(s): INR, PROTIME in the last 168 hours.  Cardiac Enzymes: Recent Labs  Lab 12/01/17 1501 12/01/17 2115  CKTOTAL 388* 308*   BNP: Invalid input(s): POCBNP CBG: Recent Labs  Lab 12/01/17 2033  GLUCAP 159*   D-Dimer No results for input(s): DDIMER in the last 72 hours. Hgb A1c No results for input(s): HGBA1C in the last 72 hours. Lipid Profile No results for input(s): CHOL, HDL, LDLCALC, TRIG, CHOLHDL, LDLDIRECT in the last 72  hours. Thyroid function studies No results for input(s): TSH, T4TOTAL, T3FREE, THYROIDAB in the last 72 hours.  Invalid input(s): FREET3 Anemia work up No results for input(s): VITAMINB12, FOLATE, FERRITIN, TIBC, IRON, RETICCTPCT in the last 72 hours. Urinalysis    Component Value Date/Time   COLORURINE YELLOW 12/01/2017 1450   APPEARANCEUR HAZY (A) 12/01/2017 1450   LABSPEC 1.014 12/01/2017 1450   PHURINE 7.0 12/01/2017 1450   GLUCOSEU NEGATIVE 12/01/2017 1450   HGBUR NEGATIVE 12/01/2017 1450   BILIRUBINUR NEGATIVE 12/01/2017 1450   KETONESUR NEGATIVE 12/01/2017 1450   PROTEINUR NEGATIVE 12/01/2017 1450   UROBILINOGEN 0.2 08/25/2015 2024   NITRITE NEGATIVE 12/01/2017 1450  LEUKOCYTESUR NEGATIVE 12/01/2017 1450     Microbiology Recent Results (from the past 240 hour(s))  Urine culture     Status: None (Preliminary result)   Collection Time: 12/01/17  3:15 AM  Result Value Ref Range Status   Specimen Description URINE, RANDOM  Final   Special Requests NONE  Final   Culture PENDING  Incomplete   Report Status PENDING  Incomplete       Inpatient Medications:   Scheduled Meds: Continuous Infusions:   Radiological Exams on Admission: Dg Chest Port 1 View  Result Date: 12/01/2017 CLINICAL DATA:  Fever EXAM: PORTABLE CHEST 1 VIEW COMPARISON:  01/23/2017 FINDINGS: The heart size and mediastinal contours are within normal limits. Both lungs are clear. The visualized skeletal structures are unremarkable. IMPRESSION: No active disease. Electronically Signed   By: Donavan Foil M.D.   On: 12/01/2017 21:06    Impression/Recommendations  1. Non-exertional heat illness  - Secondary to prolonged exposure in sauna after falling asleep  - No arrhythmia, hypertension, renal impairment, or significant CK elevation  - Low-suspicion for infection with normal lactate and WBC, unremarkable CXR and UA, and no infectious s/s aside from elevated temp  - Recommend increased fluid intake  with water and sports drinks and PCP follow-up in coming days  - Return precautions given    Thank you for this consultation.    Time Spent: 52 minutes.   Ilene Qua Opyd M.D. [facility] 12/01/2017, 10:13 PM

## 2017-12-01 NOTE — ED Notes (Signed)
Pt feels hot to the touch. Pt shivering at this time. Pt informed no blankets at this time due to temperature.

## 2017-12-01 NOTE — ED Notes (Signed)
Pt provided with applesauce and crackers along with some ice water. Daughter remains at the bedside.

## 2017-12-01 NOTE — ED Provider Notes (Signed)
Patient initially seen by [facility], PA-C, and Dr. Venora Maples. Here for evaluation of weakness after falling asleep in the gym sauna for 2 hours after working out. She was found febrile to 102 without finding of infection, felt likely due to heat exposure.   She remained febrile in the ED despite ibuprofen and Tylenol. A cooling blanket was applied with defervescence. CK slightly elevated to 308. No vomiting.   Patient signed out at the end of shift after consult to hospitalist made for admission.   Due to her adequate decrease of fever, no finding of infection, low CK, she is found appropriate for discharge home. She has been seen and evaluated by Dr. Myna Hidalgo, [facility], as well as Drs. Lysbeth Galas and Greer.  Patient and family comfortable with discharge home. Return precautions discussed.   Charlann Lange, PA-C 12/01/17 2304    Duffy Bruce, MD 12/03/17 [phone]

## 2017-12-01 NOTE — ED Notes (Signed)
Pt placed on cooling blanket per PA order. Pt with ice packs to armpits and groin. Pt last liter of fluids are with the cooled NS. Pt temperature being continually monitored. Pt family remains at the bedside. GCS remains 15.

## 2017-12-01 NOTE — Discharge Instructions (Signed)
Take ibuprofen and/or Tylenol if fever returns. Push fluids. Take a break from the gym for the next 3 days. Return to the emergency department with any new or worsening symptoms.

## 2017-12-01 NOTE — ED Provider Notes (Signed)
[facility]ER[facility] DEPARTMENT Provider Note   CSN: [id number] Arrival date & time: 12/01/17  1422     History   Chief Complaint Chief Complaint  Patient presents with  . 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  Patient states that she worked out lifting weights around 11:45am, and her upper back and arms were aching which she thought was due to lifting weights.  She decided to go to the hot tub for about 30 minutes but when this did not help she went to the sauna and ended up taking a nap in there.  She was in there for an unknown timeframe, but it was estimated to be about 2 hours however the exact timeframe was unknown.  When she woke up she realized that she was very hot and needed to get out, was able to get out of the sauna and go into the locker room and call for help.  She states that since then she has been shivering and felt chills, and has had a headache.  She also noted right ear pain today as well as mild nausea which she typically has.  Prior to going into the sauna she did not have any fevers or chills.  No known aggravating factors for her symptoms, and no treatments were given or alleviating factors known.  Of note, she arrived by EMS with a blanket on top of her.  She denies any rhinorrhea, sore throat, cough, ear drainage, URI symptoms, lightheadedness, vision changes, CP, SOB, abd pain, V/D/C, melena, hematochezia, hematuria, dysuria, vaginal bleeding/discharge, arthralgias, skin changes, numbness, tingling, focal weakness, or any other complaints at this time.  PCP is Osei-Bonsu, Iona Beard, MD    The history is provided by the patient and medical records. No language interpreter was used.    Past Medical History:  Diagnosis Date  . Arthritis   . Constipation   . Diabetes  mellitus   . Head injury, closed, with brief LOC ([place])   . High cholesterol   . Hypertension   . Knee pain, right   . Obese   . Shortness of breath    with too much fluid- not currently    Patient Active Problem List   Diagnosis Date Noted  . Postmenopausal bleeding 08/27/2015  . Adenomyosis 08/27/2015  . Skin ulcer of left foot including toes ([place]) 05/02/2015  . Equinus deformity of foot, acquired 04/02/2015  . Tenosynovitis of left foot 04/02/2015  . Arthritis of knee 08/13/2014  . Pain in lower limb 04/24/2014  . Onychomycosis 11/02/2013  . Pain, foot 11/02/2013  . Acquired deformity of toe 11/02/2013  . Unspecified venous (peripheral) insufficiency 09/13/2012  . Cellulitis of left leg 07/07/2012  . ARF (acute renal failure) ([place]) 07/07/2012  . LEG PAIN, LEFT 01/10/2008  . ANXIETY 11/19/2007  . DIABETIC FOOT ULCER, TOE 11/17/2007  . CHEST PAIN UNSPECIFIED 11/01/2007  . DIABETES MELLITUS, TYPE II 07/29/2007  . DEPRESSION 07/29/2007  . HYPERTENSION 07/29/2007    Past Surgical History:  Procedure Laterality Date  . carbuncle     back of head  . CESAREAN SECTION    . COLONOSCOPY    . KNEE SURGERY     quad  . QUADRICEPS REPAIR    . TOTAL KNEE ARTHROPLASTY Left 08/13/2014  Procedure: TOTAL KNEE ARTHROPLASTY;  Cormier: Meredith Pel, MD;  Location: [facility];  Service: Orthopedics;  Laterality: Left;    OB History    Gravida Para Term Preterm AB Living   8 4 4   4 4    SAB TAB Ectopic Multiple Live Births     4     4       Home Medications    Prior to Admission medications   Medication Sig Start Date End Date Taking? Authorizing Provider  acetaminophen-codeine (TYLENOL #3) 300-30 MG per tablet Take 1-2 tablets by mouth every 6 (six) hours as needed. Patient not taking: Reported on 04/11/2016 08/25/15   Luvenia Redden, PA-C  acetaminophen-codeine (TYLENOL #3) 300-30 MG tablet Take 1 tablet by mouth every 8 (eight) hours as needed for moderate pain. 08/11/17    Meredith Pel, MD  benzonatate (TESSALON) 100 MG capsule Take 1 capsule (100 mg total) by mouth every 8 (eight) hours. 01/23/17   Mercy Riding, MD  Carbamide Peroxide (EAR WAX DROPS OT) Place 3-4 drops into both ears as needed (for ear wax).    [provider]  exenatide (BYETTA 10 MCG PEN) 10 MCG/0.04ML SOPN injection Inject 10 mcg into the skin 2 (two) times daily with a meal.    [provider]  glipiZIDE-metformin (METAGLIP) 5-500 MG per tablet Take 1 tablet by mouth 2 (two) times daily.     [provider]  lisinopril-hydrochlorothiazide (PRINZIDE,ZESTORETIC) 20-25 MG per tablet Take 1 tablet by mouth daily.     [provider]  megestrol (MEGACE) 40 MG tablet Take 1 tablet (40 mg total) by mouth 2 (two) times daily. Patient not taking: Reported on 04/11/2016 08/25/15   Luvenia Redden, PA-C  methocarbamol (ROBAXIN) 500 MG tablet Take 1 tablet (500 mg total) by mouth at bedtime as needed for muscle spasms. 08/21/17   Avie Echevaria B, PA-C  Omega-3 Fatty Acids (FISH OIL PO) Take 1 capsule by mouth daily.    [provider]  oseltamivir (TAMIFLU) 75 MG capsule Take 1 capsule (75 mg total) by mouth 2 (two) times daily. 01/23/17   Mercy Riding, MD    Family History Family History  Problem Relation Age of Onset  . Diabetes Mother   . Hyperlipidemia Mother   . Hypertension Mother   . Diabetes Father   . Hyperlipidemia Father   . Hypertension Father   . Cancer Brother   . Heart disease Brother   . Hypertension Brother   . Heart attack Brother     Social History Social History   Tobacco Use  . Smoking status: Former Smoker    Years: 0.00    Types: Cigarettes    Last attempt to quit: 12/13/1978    Years since quitting: 38.9  . Smokeless tobacco: Never Used  Substance Use Topics  . Alcohol use: No    Alcohol/week: 0.0 oz  . Drug use: No     Allergies   Aleve [naproxen sodium]; Iodine; Iohexol; and Percocet  [oxycodone-acetaminophen]   Review of Systems Review of Systems  Constitutional: Positive for chills. Negative for fever (none prior to sauna use).  HENT: Positive for ear pain. Negative for congestion, ear discharge, rhinorrhea and sore throat.   Eyes: Negative for visual disturbance.  Respiratory: Negative for cough and shortness of breath.   Cardiovascular: Negative for chest pain.  Gastrointestinal: Positive for nausea. Negative for abdominal pain, blood in stool, constipation, diarrhea and vomiting.  Genitourinary: Negative for  dysuria, hematuria, vaginal bleeding and vaginal discharge.  Musculoskeletal: Positive for myalgias. Negative for arthralgias.  Skin: Negative for color change.  Allergic/Immunologic: Positive for immunocompromised state (DM2).  Neurological: Positive for headaches. Negative for weakness, light-headedness and numbness.  Psychiatric/Behavioral: Negative for confusion.   All other systems reviewed and are negative for acute change except as noted in the HPI.    Physical Exam Updated Vital Signs BP (!) 142/85 (BP Location: Right Arm)   Pulse (!) 114   Temp (!) 102.6 F (39.2 C) (Oral)   Resp (!) 21   LMP 08/22/2015   SpO2 97%  Exam VS: BP 135/81   Pulse (!) 108   Temp (!) 101.1 F (38.4 C) (Oral)   Resp (!) 21   LMP 08/22/2015   SpO2 98%    Physical Exam  Constitutional: She is oriented to person, place, and time. She appears well-developed and well-nourished.  Non-toxic appearance. She appears distressed (shivering).  Temp 101.1 during evaluation, nontoxic, shivering but otherwise in NAD  HENT:  Head: Normocephalic and atraumatic.  Right Ear: Hearing and external ear normal. A foreign body (cerumen impaction) is present.  Left Ear: Hearing, tympanic membrane, external ear and ear canal normal.  Nose: Nose normal.  Mouth/Throat: Uvula is midline, oropharynx is clear and moist and mucous membranes are normal. No trismus in the jaw. No uvula  swelling. Tonsils are 0 on the right. Tonsils are 0 on the left. No tonsillar exudate.  L ear clear. R ear with cerumen impaction deep in the canal, unable to visualize TM; no canal swelling or drainage. Nose clear. Oropharynx clear and moist, without uvular swelling or deviation, no trismus or drooling, no tonsillar swelling or erythema, no exudates.    Eyes: Conjunctivae and EOM are normal. Pupils are equal, round, and reactive to light. Right eye exhibits no discharge. Left eye exhibits no discharge.  PERRL, EOMI, no nystagmus, no visual field deficits   Neck: Normal range of motion. Neck supple. No spinous process tenderness and no muscular tenderness present. No neck rigidity. Normal range of motion present.  FROM intact without spinous process TTP, no bony stepoffs or deformities, no paraspinous muscle TTP or muscle spasms. No rigidity or meningeal signs. No bruising or swelling.   Cardiovascular: Regular rhythm, normal heart sounds and intact distal pulses. Tachycardia present. Exam reveals no gallop and no friction rub.  No murmur heard. Mildly tachycardic  Pulmonary/Chest: Effort normal and breath sounds normal. No respiratory distress. She has no decreased breath sounds. She has no wheezes. She has no rhonchi. She has no rales.  Abdominal: Soft. Normal appearance and bowel sounds are normal. She exhibits no distension. There is no tenderness. There is no rigidity, no rebound, no guarding, no CVA tenderness, no tenderness at McBurney's point and negative Murphy's sign.  Musculoskeletal: Normal range of motion.  MAE x4 Strength and sensation grossly intact in all extremities Distal pulses intact  Neurological: She is alert and oriented to person, place, and time. She has normal strength. No cranial nerve deficit or sensory deficit. Coordination and gait normal. GCS eye subscore is 4. GCS verbal subscore is 5. GCS motor subscore is 6.  CN 2-12 grossly intact A&O x4 GCS 15 Sensation and  strength intact Coordination with finger-to-nose WNL Neg pronator drift   Skin: Skin is warm, dry and intact. No rash noted.  Psychiatric: She has a normal mood and affect.  Nursing note and vitals reviewed.    ED Treatments / Results  Labs (all  labs ordered are listed, but only abnormal results are displayed) Labs Reviewed  CBC WITH DIFFERENTIAL/PLATELET - Abnormal; Notable for the following components:      Result Value   Hemoglobin 11.7 (*)    HCT 35.1 (*)    All other components within normal limits  COMPREHENSIVE METABOLIC PANEL - Abnormal; Notable for the following components:   Sodium 134 (*)    Glucose, Bld 152 (*)    Creatinine, Ser 1.03 (*)    GFR calc non Af Amer 60 (*)    All other components within normal limits  URINALYSIS, ROUTINE W REFLEX MICROSCOPIC - Abnormal; Notable for the following components:   APPearance HAZY (*)    All other components within normal limits  CK - Abnormal; Notable for the following components:   Total CK 388 (*)    All other components within normal limits  URINE CULTURE  CULTURE, BLOOD (ROUTINE X 2)  CULTURE, BLOOD (ROUTINE X 2)  I-STAT CG4 LACTIC ACID, ED    EKG  EKG Interpretation  Date/Time:  Thursday December 01 2017 14:39:38 EST Ventricular Rate:  109 PR Interval:    QRS Duration: 103 QT Interval:  338 QTC Calculation: 456 R Axis:   14 Text Interpretation:  Sinus tachycardia Borderline T abnormalities, lateral leads No significant change was found Confirmed by Jola Schmidt [phone]) on 12/01/2017 3:24:44 PM       Radiology No results found.  Procedures Procedures (including critical care time)   CRITICAL CARE Performed by: Reece Agar   Total critical care time: 60 minutes  Critical care time was exclusive of separately billable procedures and treating other patients.  Critical care was necessary to treat or prevent imminent or life-threatening deterioration.  Critical care was time spent personally  by me on the following activities: development of treatment plan with patient and/or surrogate as well as nursing, discussions with consultants, evaluation of patient's response to treatment, examination of patient, obtaining history from patient or surrogate, ordering and performing treatments and interventions, ordering and review of laboratory studies, ordering and review of radiographic studies, pulse oximetry and re-evaluation of patient's condition.   Medications Ordered in ED Medications  sodium chloride 0.9 % bolus 1,000 mL (not administered)  sodium chloride 0.9 % bolus 1,000 mL (0 mLs Intravenous Stopped 12/01/17 1801)  sodium chloride 0.9 % bolus 1,000 mL (0 mLs Intravenous Stopped 12/01/17 1801)  acetaminophen (TYLENOL) tablet 1,000 mg (1,000 mg Oral Given 12/01/17 1535)  ibuprofen (ADVIL,MOTRIN) tablet 800 mg (800 mg Oral Given 12/01/17 1829)     Initial Impression / Assessment and Plan / ED Course  I have reviewed the triage vital signs and the nursing notes.  Pertinent labs & imaging results that were available during my care of the patient were reviewed by me and considered in my medical decision making (see chart for details).     56 y.o. female here with possible heat exposure vs fever. Pt was at the gym lifting weights, got upper arm/back aches so went to the hot tub x30mins then sauna and went to sleep; woke up after unknown time frame (~2hrs suspected) and felt like she couldn't make it to the front desk for help; EMS called. On arrival, temp 102.6, pt shivering and states she feels cold; after about 75mins temp down to 101.1. States she has a headache and R ear pain that started today, complains of nausea which is a regular occurrence for her (denies wanting anything for it).  On exam, feels hot, febrile  101.1, mildly tachycardic, shivering, no abdominal tenderness, no meningismus, no focal neuro deficits, L ear clear, R ear occluded with cerumen impaction. Will get labs to  evaluate for possible infection, will hold off on calling code sepsis as pt is still overall well appearing despite having hyperthermic temp. EKG unremarkable. Will get CK as well to see if this is rhabdo. Will hold off on CXR or any imaging. Will give 2L IV fluids and tylenol (in case this is just a fever), will hold off on cooling blankets and will just let pt remain in cool room with cool fluids running; will reassess shortly. Discussed case with my attending Dr. Venora Maples who agrees with plan.  5:05 PM CBC w/diff with mild anemia which is stable, no leukocytosis. CMP with stable kidney function and hyperglycemia of 152 but otherwise fairly unremarkable. Lactic negative. U/A without evidence of infection. CK pending. Temp was up to 102.7 right after I checked it, and is still 102.5 after an hour of being in the cold room with IVFs running and 1000mg  tylenol; at this point, will go ahead and try cooling blanket to see if this helps bring her temp down. Will continue to monitor and await CK, and reassess shortly. Dr. Venora Maples aware of pt, still doesn't think this is heat exposure related as much as fever related, but agrees with trial of cooling blanket. He will also see pt. Will continue to monitor and reassess shortly.   6:01 PM Nursing staff did not get a chance to put her on the cooling blanket, however she appears to have started to come down, temp now 101.0; pt no longer shivering; pt verifying that she CAN take ibuprofen, so will try this now and see if it brings it down; if it does then we won't need to use the cooling blanket. Still awaiting CK. Will reassess shortly.   8:31 PM Pt still no longer shivering, very somnolent but not altered or confused. CK mildly elevated at 388. Temp 101.5 an hour after ibuprofen, despite being in a cold room with cold fluids being given. At this point, we've given plenty of time in a cold room to bring her temp down and it hasn't helped; could be possibly rhabdomyolysis  vs just heat exposure; will put her on a cooling blanket with ice packs on her as well as cooled fluids; will proceed with admission as well. Discussed case with my current attending Dr. Ellender Hose who agrees with plan.   8:54 PM Dr. Myna Hidalgo of [facility] returning page, isn't agreeable to admit pt at this time, states he'll come see pt and evaluate based on his assessment. Patient care to be resumed by Charlann Lange, PA-C at shift change sign-out, she will await Dr. Criss Rosales response and manage pt from here. Patient history has been discussed with midlevel resuming care. Please see their notes for further documentation of pending results and dispo/care. Pt stable at sign-out and updated on transfer of care.    Final Clinical Impressions(s) / ED Diagnoses   Final diagnoses:  Heat exposure, initial encounter  Fever, unspecified fever cause  Body aches  Elevated CK  Impacted cerumen of right ear  Chronic anemia  Traumatic rhabdomyolysis, initial encounter [facility])  Hyperthermia    ED Discharge Orders    [address] [place], [place] 12/01/17 2103    Jola Schmidt, MD 12/02/17 [phone]

## 2017-12-01 NOTE — ED Triage Notes (Signed)
Pt arrives via ems with reports of heat exposure. Pt was found asleep in the sauna at the [facility] for possibly up to 2 hours. Pt VSS with EMS. BP 138/78, P 102, RR 18, 98% RA, CBG 168.

## 2017-12-02 ENCOUNTER — Telehealth (HOSPITAL_BASED_OUTPATIENT_CLINIC_OR_DEPARTMENT_OTHER): Payer: Self-pay | Admitting: Emergency Medicine

## 2017-12-02 ENCOUNTER — Telehealth (HOSPITAL_BASED_OUTPATIENT_CLINIC_OR_DEPARTMENT_OTHER): Payer: Self-pay | Admitting: *Deleted

## 2017-12-02 LAB — BLOOD CULTURE ID PANEL (REFLEXED)
ACINETOBACTER BAUMANNII: NOT DETECTED
CANDIDA ALBICANS: NOT DETECTED
CANDIDA GLABRATA: NOT DETECTED
CANDIDA KRUSEI: NOT DETECTED
CANDIDA PARAPSILOSIS: NOT DETECTED
CANDIDA TROPICALIS: NOT DETECTED
ENTEROBACTERIACEAE SPECIES: NOT DETECTED
Enterobacter cloacae complex: NOT DETECTED
Enterococcus species: NOT DETECTED
Escherichia coli: NOT DETECTED
HAEMOPHILUS INFLUENZAE: NOT DETECTED
KLEBSIELLA OXYTOCA: NOT DETECTED
KLEBSIELLA PNEUMONIAE: NOT DETECTED
Listeria monocytogenes: NOT DETECTED
Neisseria meningitidis: NOT DETECTED
Proteus species: NOT DETECTED
Pseudomonas aeruginosa: NOT DETECTED
STREPTOCOCCUS SPECIES: DETECTED — AB
Serratia marcescens: NOT DETECTED
Staphylococcus aureus (BCID): NOT DETECTED
Staphylococcus species: NOT DETECTED
Streptococcus agalactiae: NOT DETECTED
Streptococcus pneumoniae: NOT DETECTED
Streptococcus pyogenes: NOT DETECTED

## 2017-12-02 LAB — URINE CULTURE: Culture: <10000 — AB

## 2017-12-02 NOTE — Telephone Encounter (Signed)
Called patient, left message to call back and need for strep throat testing.

## 2017-12-04 ENCOUNTER — Ambulatory Visit (HOSPITAL_COMMUNITY)
Admission: RE | Admit: 2017-12-04 | Discharge: 2017-12-04 | Disposition: A | Discharge status: Home / Self Care | Payer: Medicaid Other | Source: Ambulatory Visit | Attending: Emergency Medicine | Admitting: Emergency Medicine

## 2017-12-04 ENCOUNTER — Emergency Department (HOSPITAL_COMMUNITY): Payer: Medicaid Other

## 2017-12-04 ENCOUNTER — Other Ambulatory Visit: Payer: Self-pay

## 2017-12-04 ENCOUNTER — Emergency Department (HOSPITAL_COMMUNITY)
Admission: EM | Admit: 2017-12-04 | Discharge: 2017-12-04 | Disposition: A | Discharge status: Home / Self Care | Payer: Medicaid Other | Attending: Emergency Medicine | Admitting: Emergency Medicine

## 2017-12-04 ENCOUNTER — Encounter (HOSPITAL_COMMUNITY): Payer: Self-pay

## 2017-12-04 DIAGNOSIS — Z7984 Long term (current) use of oral hypoglycemic drugs: Secondary | ICD-10-CM | POA: Diagnosis not present

## 2017-12-04 DIAGNOSIS — M7989 Other specified soft tissue disorders: Secondary | ICD-10-CM | POA: Insufficient documentation

## 2017-12-04 DIAGNOSIS — E119 Type 2 diabetes mellitus without complications: Secondary | ICD-10-CM | POA: Diagnosis not present

## 2017-12-04 DIAGNOSIS — Z79899 Other long term (current) drug therapy: Secondary | ICD-10-CM | POA: Diagnosis not present

## 2017-12-04 DIAGNOSIS — E7800 Pure hypercholesterolemia, unspecified: Secondary | ICD-10-CM | POA: Diagnosis not present

## 2017-12-04 DIAGNOSIS — L03116 Cellulitis of left lower limb: Secondary | ICD-10-CM | POA: Diagnosis not present

## 2017-12-04 DIAGNOSIS — R609 Edema, unspecified: Secondary | ICD-10-CM

## 2017-12-04 DIAGNOSIS — I10 Essential (primary) hypertension: Secondary | ICD-10-CM | POA: Diagnosis not present

## 2017-12-04 DIAGNOSIS — M25562 Pain in left knee: Secondary | ICD-10-CM | POA: Diagnosis present

## 2017-12-04 LAB — BASIC METABOLIC PANEL
Anion gap: 9 (ref 5–15)
BUN: 25 mg/dL — AB (ref 6–20)
CALCIUM: 8.4 mg/dL — AB (ref 8.9–10.3)
CO2: 24 mmol/L (ref 22–32)
CREATININE: 1.1 mg/dL — AB (ref 0.44–1.00)
Chloride: 98 mmol/L — ABNORMAL LOW (ref 101–111)
GFR calc Af Amer: >60 mL/min (ref >60)
GFR calc non Af Amer: 55 mL/min — ABNORMAL LOW (ref >60)
GLUCOSE: 184 mg/dL — AB (ref 65–99)
Potassium: 3.6 mmol/L (ref 3.5–5.1)
Sodium: 131 mmol/L — ABNORMAL LOW (ref 135–145)

## 2017-12-04 LAB — CULTURE, BLOOD (ROUTINE X 2): SPECIAL REQUESTS: ADEQUATE

## 2017-12-04 LAB — CBC WITH DIFFERENTIAL/PLATELET
Basophils Absolute: 0 10*3/uL (ref 0.0–0.1)
Basophils Relative: 0 %
EOS PCT: 1 %
Eosinophils Absolute: 0 10*3/uL (ref 0.0–0.7)
HEMATOCRIT: 35.1 % — AB (ref 36.0–46.0)
Hemoglobin: 11.9 g/dL — ABNORMAL LOW (ref 12.0–15.0)
LYMPHS PCT: 12 %
Lymphs Abs: 0.8 10*3/uL (ref 0.7–4.0)
MCH: 27.9 pg (ref 26.0–34.0)
MCHC: 33.9 g/dL (ref 30.0–36.0)
MCV: 82.4 fL (ref 78.0–100.0)
MONO ABS: 0.3 10*3/uL (ref 0.1–1.0)
MONOS PCT: 5 %
NEUTROS ABS: 5.5 10*3/uL (ref 1.7–7.7)
Neutrophils Relative %: 82 %
Platelets: 185 10*3/uL (ref 150–400)
RBC: 4.26 MIL/uL (ref 3.87–5.11)
RDW: 14.6 % (ref 11.5–15.5)
WBC: 6.7 10*3/uL (ref 4.0–10.5)

## 2017-12-04 MED ORDER — HYDROCODONE-ACETAMINOPHEN 5-325 MG PO TABS: 1.0000 | ORAL_TABLET | Freq: Four times a day (QID) | ORAL | 0 refills | Status: DC | PRN

## 2017-12-04 MED ORDER — HYDROCODONE-ACETAMINOPHEN 5-325 MG PO TABS
1.0000 | ORAL_TABLET | Freq: Once | ORAL | Status: AC
  Administered 2017-12-04: 1 via ORAL
  Filled 2017-12-04: qty 1

## 2017-12-04 MED ORDER — DOXYCYCLINE HYCLATE 100 MG PO CAPS: 100.0000 mg | ORAL_CAPSULE | Freq: Two times a day (BID) | ORAL | 0 refills | Status: DC

## 2017-12-04 MED ORDER — DOXYCYCLINE HYCLATE 100 MG PO TABS
100.0000 mg | ORAL_TABLET | Freq: Once | ORAL | Status: AC
  Administered 2017-12-04: 100 mg via ORAL
  Filled 2017-12-04: qty 1

## 2017-12-04 NOTE — Progress Notes (Signed)
VASCULAR LAB PRELIMINARY  PRELIMINARY  PRELIMINARY  PRELIMINARY  Left lower extremity venous duplex completed.    Preliminary report:  There is no DVT or SVT noted in the left lower extremity.  There are multiple enlarged inguinal lymph nodes noted.   Yeng Perz, RVT 12/04/2017, 9:05 AM

## 2017-12-04 NOTE — Discharge Instructions (Signed)
You were seen today for swelling and pain of the left lower extremity.  It appears you have an infection.  You should take antibiotics and pain medication as directed.  A study of her left lower extremity was ordered to evaluate for blood clots.  Return as directed.  If you develop any new or worsening symptoms, fevers, increasing redness you should be reevaluated.

## 2017-12-04 NOTE — ED Provider Notes (Signed)
[facility] EMERGENCY DEPARTMENT Provider Note   CSN: [id number] Arrival date & time: 12/04/17  0231     History   Chief Complaint Chief Complaint  Patient presents with  . Knee Pain    HPI Kaitlyn Castillo is a 56 y.o. female.  HPI  This is a 56 year old female who presents with left leg pain.  Patient states that she noted increasing left leg pain and swelling on Friday.  She noted erythema.  She does have a left knee replacement.  Denies any left knee pain.  Denies any fevers.  Has not recently been on any antibiotics.  She was seen and evaluated last week after developing hyperthermia from falling asleep in a sauna.  Currently she rates her pain at 10 out of 10.  It is worse with ambulation.  She has not taken any medications for her pain.  Denies any recent long travel, hospitalizations.  Past Medical History:  Diagnosis Date  . Arthritis   . Constipation   . Diabetes mellitus   . Head injury, closed, with brief LOC ([place])   . High cholesterol   . Hypertension   . Knee pain, right   . Obese   . Shortness of breath    with too much fluid- not currently    Patient Active Problem List   Diagnosis Date Noted  . Heat exposure 12/01/2017  . Hyperthermia   . Postmenopausal bleeding 08/27/2015  . Adenomyosis 08/27/2015  . Skin ulcer of left foot including toes ([place]) 05/02/2015  . Equinus deformity of foot, acquired 04/02/2015  . Tenosynovitis of left foot 04/02/2015  . Arthritis of knee 08/13/2014  . Pain in lower limb 04/24/2014  . Onychomycosis 11/02/2013  . Pain, foot 11/02/2013  . Acquired deformity of toe 11/02/2013  . Unspecified venous (peripheral) insufficiency 09/13/2012  . Cellulitis of left leg 07/07/2012  . ARF (acute renal failure) ([place]) 07/07/2012  . LEG PAIN, LEFT 01/10/2008  . ANXIETY 11/19/2007  . DIABETIC FOOT ULCER, TOE 11/17/2007  . CHEST PAIN UNSPECIFIED 11/01/2007  . DIABETES MELLITUS, TYPE II 07/29/2007  . DEPRESSION  07/29/2007  . Essential hypertension 07/29/2007    Past Surgical History:  Procedure Laterality Date  . carbuncle     back of head  . CESAREAN SECTION    . COLONOSCOPY    . KNEE SURGERY     quad  . QUADRICEPS REPAIR    . TOTAL KNEE ARTHROPLASTY Left 08/13/2014   Procedure: TOTAL KNEE ARTHROPLASTY;  [place]: Meredith Pel, MD;  Location: [facility];  Service: Orthopedics;  Laterality: Left;    OB History    Gravida Para Term Preterm AB Living   8 4 4   4 4    SAB TAB Ectopic Multiple Live Births     4     4       Home Medications    Prior to Admission medications   Medication Sig Start Date End Date Taking? Authorizing Provider  acetaminophen-codeine (TYLENOL #3) 300-30 MG per tablet Take 1-2 tablets by mouth every 6 (six) hours as needed. 08/25/15   Luvenia Redden, PA-C  acetaminophen-codeine (TYLENOL #3) 300-30 MG tablet Take 1 tablet by mouth every 8 (eight) hours as needed for moderate pain. 08/11/17   Meredith Pel, MD  benzonatate (TESSALON) 100 MG capsule Take 1 capsule (100 mg total) by mouth every 8 (eight) hours. 01/23/17   Mercy Riding, MD  Carbamide Peroxide (EAR WAX DROPS OT) Place 3-4 drops into  both ears as needed (for ear wax).    [provider]  exenatide (BYETTA 10 MCG PEN) 10 MCG/0.04ML SOPN injection Inject 10 mcg into the skin 2 (two) times daily with a meal.    [provider]  glipiZIDE-metformin (METAGLIP) 5-500 MG per tablet Take 1 tablet by mouth 2 (two) times daily.     [provider]  lisinopril-hydrochlorothiazide (PRINZIDE,ZESTORETIC) 20-25 MG per tablet Take 1 tablet by mouth daily.     [provider]  megestrol (MEGACE) 40 MG tablet Take 1 tablet (40 mg total) by mouth 2 (two) times daily. 08/25/15   Luvenia Redden, PA-C  methocarbamol (ROBAXIN) 500 MG tablet Take 1 tablet (500 mg total) by mouth at bedtime as needed for muscle spasms. 08/21/17   Avie Echevaria B, PA-C  Omega-3 Fatty Acids (FISH OIL PO)  Take 1 capsule by mouth daily.    [provider]  oseltamivir (TAMIFLU) 75 MG capsule Take 1 capsule (75 mg total) by mouth 2 (two) times daily. Patient not taking: Reported on 12/01/2017 01/23/17   Mercy Riding, MD  VOLTAREN 1 % GEL  11/11/17   [provider]    Family History Family History  Problem Relation Age of Onset  . Diabetes Mother   . Hyperlipidemia Mother   . Hypertension Mother   . Diabetes Father   . Hyperlipidemia Father   . Hypertension Father   . Cancer Brother   . Heart disease Brother   . Hypertension Brother   . Heart attack Brother     Social History Social History   Tobacco Use  . Smoking status: Former Smoker    Years: 0.00    Types: Cigarettes    Last attempt to quit: 12/13/1978    Years since quitting: 39.0  . Smokeless tobacco: Never Used  Substance Use Topics  . Alcohol use: No    Alcohol/week: 0.0 oz  . Drug use: No     Allergies   Aleve [naproxen sodium]; Iodine; Iohexol; Percocet [oxycodone-acetaminophen]; and Shellfish allergy   Review of Systems Review of Systems  Constitutional: Negative for fever.  Respiratory: Negative for shortness of breath.   Cardiovascular: Positive for leg swelling. Negative for chest pain.  Skin: Positive for color change.  All other systems reviewed and are negative.    Physical Exam Updated Vital Signs BP 121/71   Pulse 84   Temp 98.8 F (37.1 C)   Resp 18   LMP 08/22/2015   SpO2 100%   Physical Exam  Constitutional: She is oriented to person, place, and time. She appears well-developed and well-nourished. No distress.  Overweight  HENT:  Head: Normocephalic and atraumatic.  Cardiovascular: Normal rate, regular rhythm and normal heart sounds.  Pulmonary/Chest: Effort normal. No respiratory distress. She has no wheezes.  Musculoskeletal:  1+ lower extremity swelling of the left lower leg, asymmetric, normal range of motion of the knee, no overlying skin changes or  effusion, 2+ pulses  Neurological: She is alert and oriented to person, place, and time.  Skin: Skin is warm and dry.  Warmth and erythema with associated swelling along the entirety of the left lower extremity below the knee  Psychiatric: She has a normal mood and affect.  Nursing note and vitals reviewed.    ED Treatments / Results  Labs (all labs ordered are listed, but only abnormal results are displayed) Labs Reviewed  CBC WITH DIFFERENTIAL/PLATELET - Abnormal; Notable for the following components:      Result  Value   Hemoglobin 11.9 (*)    HCT 35.1 (*)    All other components within normal limits  BASIC METABOLIC PANEL - Abnormal; Notable for the following components:   Sodium 131 (*)    Chloride 98 (*)    Glucose, Bld 184 (*)    BUN 25 (*)    Creatinine, Ser 1.10 (*)    Calcium 8.4 (*)    GFR calc non Af Amer 55 (*)    All other components within normal limits    EKG  EKG Interpretation None       Radiology Dg Tibia/fibula Left  Result Date: 12/04/2017 CLINICAL DATA:  56 year old female with left lower extremity redness and swelling. EXAM: LEFT TIBIA AND FIBULA - 2 VIEW COMPARISON:  None. FINDINGS: There is a total left knee arthroplasty which appears intact and in anatomic alignment. There is no acute fracture or dislocation. A small suprapatellar effusion may be present. There is degenerative changes of the tarsal bones. Mild diffuse subcutaneous edema may represent cellulitis. Clinical correlation is recommended. No soft tissue air. IMPRESSION: 1. No acute fracture or dislocation. 2. Left knee arthroplasty appears intact. 3. Mild diffuse subcutaneous edema, likely cellulitis. Clinical correlation is recommended. No soft tissue air. Electronically Signed   By: Anner Crete M.D.   On: 12/04/2017 03:37    Procedures Procedures (including critical care time)  Medications Ordered in ED Medications  doxycycline (VIBRA-TABS) tablet 100 mg (100 mg Oral Given  12/04/17 0537)  HYDROcodone-acetaminophen (NORCO/VICODIN) 5-325 MG per tablet 1 tablet (1 tablet Oral Given 12/04/17 0537)     Initial Impression / Assessment and Plan / ED Course  I have reviewed the triage vital signs and the nursing notes.  Pertinent labs & imaging results that were available during my care of the patient were reviewed by me and considered in my medical decision making (see chart for details).    Patient presents with pain and swelling of the left lower extremity.  Appears to be cellulitis.  Does not appear to be associated with the left knee joint.  Denies fevers.  Was recently evaluated for hyperthermia but this was thought to be secondary to falling asleep in a sauna.  No significant leukocytosis.  Lab work obtained and reassuring.  Will start patient on doxycycline.  Follow-up with primary physician recommended.  After history, exam, and medical workup I feel the patient has been appropriately medically screened and is safe for discharge home. Pertinent diagnoses were discussed with the patient. Patient was given return precautions.   Final Clinical Impressions(s) / ED Diagnoses   Final diagnoses:  Cellulitis of left lower extremity    ED Discharge Orders    None       Greg Eckrich, Barbette Hair, MD 12/04/17 [phone]

## 2017-12-04 NOTE — ED Triage Notes (Signed)
Pt states that after leaving here on Tursday she began to have L knee swelling, redness and warmth. Hx of knee replacement three years ago.

## 2017-12-04 NOTE — ED Notes (Signed)
Pt states in x ray the it is not her knee hurting but her lower leg.

## 2017-12-05 ENCOUNTER — Telehealth: Payer: Self-pay | Admitting: *Deleted

## 2017-12-05 ENCOUNTER — Inpatient Hospital Stay (HOSPITAL_COMMUNITY)
Admission: EM | Admit: 2017-12-05 | Discharge: 2017-12-08 | DRG: 872 | Disposition: A | Discharge status: Home / Self Care | Payer: Medicaid Other | Attending: Internal Medicine | Admitting: Internal Medicine

## 2017-12-05 ENCOUNTER — Other Ambulatory Visit: Payer: Self-pay

## 2017-12-05 ENCOUNTER — Encounter (HOSPITAL_COMMUNITY): Payer: Self-pay | Admitting: Emergency Medicine

## 2017-12-05 DIAGNOSIS — Z886 Allergy status to analgesic agent status: Secondary | ICD-10-CM

## 2017-12-05 DIAGNOSIS — I10 Essential (primary) hypertension: Secondary | ICD-10-CM | POA: Diagnosis present

## 2017-12-05 DIAGNOSIS — Z7984 Long term (current) use of oral hypoglycemic drugs: Secondary | ICD-10-CM

## 2017-12-05 DIAGNOSIS — E871 Hypo-osmolality and hyponatremia: Secondary | ICD-10-CM | POA: Diagnosis present

## 2017-12-05 DIAGNOSIS — Z91128 Patient's intentional underdosing of medication regimen for other reason: Secondary | ICD-10-CM

## 2017-12-05 DIAGNOSIS — L03116 Cellulitis of left lower limb: Secondary | ICD-10-CM | POA: Diagnosis present

## 2017-12-05 DIAGNOSIS — E669 Obesity, unspecified: Secondary | ICD-10-CM | POA: Diagnosis present

## 2017-12-05 DIAGNOSIS — E876 Hypokalemia: Secondary | ICD-10-CM | POA: Diagnosis present

## 2017-12-05 DIAGNOSIS — E7800 Pure hypercholesterolemia, unspecified: Secondary | ICD-10-CM | POA: Diagnosis present

## 2017-12-05 DIAGNOSIS — Z96652 Presence of left artificial knee joint: Secondary | ICD-10-CM | POA: Diagnosis present

## 2017-12-05 DIAGNOSIS — Z885 Allergy status to narcotic agent status: Secondary | ICD-10-CM

## 2017-12-05 DIAGNOSIS — Z8349 Family history of other endocrine, nutritional and metabolic diseases: Secondary | ICD-10-CM

## 2017-12-05 DIAGNOSIS — E1165 Type 2 diabetes mellitus with hyperglycemia: Secondary | ICD-10-CM | POA: Diagnosis present

## 2017-12-05 DIAGNOSIS — Z91013 Allergy to seafood: Secondary | ICD-10-CM

## 2017-12-05 DIAGNOSIS — Z833 Family history of diabetes mellitus: Secondary | ICD-10-CM

## 2017-12-05 DIAGNOSIS — I959 Hypotension, unspecified: Secondary | ICD-10-CM | POA: Diagnosis present

## 2017-12-05 DIAGNOSIS — T383X6A Underdosing of insulin and oral hypoglycemic [antidiabetic] drugs, initial encounter: Secondary | ICD-10-CM | POA: Diagnosis present

## 2017-12-05 DIAGNOSIS — Z87891 Personal history of nicotine dependence: Secondary | ICD-10-CM

## 2017-12-05 DIAGNOSIS — B955 Unspecified streptococcus as the cause of diseases classified elsewhere: Secondary | ICD-10-CM | POA: Diagnosis present

## 2017-12-05 DIAGNOSIS — Z91048 Other nonmedicinal substance allergy status: Secondary | ICD-10-CM

## 2017-12-05 DIAGNOSIS — E785 Hyperlipidemia, unspecified: Secondary | ICD-10-CM | POA: Diagnosis present

## 2017-12-05 DIAGNOSIS — Z6839 Body mass index (BMI) 39.0-39.9, adult: Secondary | ICD-10-CM

## 2017-12-05 DIAGNOSIS — B954 Other streptococcus as the cause of diseases classified elsewhere: Secondary | ICD-10-CM | POA: Diagnosis present

## 2017-12-05 DIAGNOSIS — R7881 Bacteremia: Principal | ICD-10-CM | POA: Diagnosis present

## 2017-12-05 HISTORY — DX: Obesity, unspecified: E66.9

## 2017-12-05 HISTORY — DX: Cellulitis of left lower limb: L03.116

## 2017-12-05 LAB — CBC WITH DIFFERENTIAL/PLATELET
Basophils Absolute: 0 10*3/uL (ref 0.0–0.1)
Basophils Relative: 0 %
Eosinophils Absolute: 0 10*3/uL (ref 0.0–0.7)
Eosinophils Relative: 0 %
HCT: 32.6 % — ABNORMAL LOW (ref 36.0–46.0)
HEMOGLOBIN: 10.7 g/dL — AB (ref 12.0–15.0)
LYMPHS ABS: 1.1 10*3/uL (ref 0.7–4.0)
LYMPHS PCT: 11 %
MCH: 26.8 pg (ref 26.0–34.0)
MCHC: 32.8 g/dL (ref 30.0–36.0)
MCV: 81.7 fL (ref 78.0–100.0)
Monocytes Absolute: 1 10*3/uL (ref 0.1–1.0)
Monocytes Relative: 9 %
NEUTROS PCT: 80 %
Neutro Abs: 8.1 10*3/uL — ABNORMAL HIGH (ref 1.7–7.7)
Platelets: 227 10*3/uL (ref 150–400)
RBC: 3.99 MIL/uL (ref 3.87–5.11)
RDW: 14 % (ref 11.5–15.5)
WBC: 10.2 10*3/uL (ref 4.0–10.5)

## 2017-12-05 LAB — COMPREHENSIVE METABOLIC PANEL
ALK PHOS: 47 U/L (ref 38–126)
ALT: 34 U/L (ref 14–54)
AST: 27 U/L (ref 15–41)
Albumin: 3 g/dL — ABNORMAL LOW (ref 3.5–5.0)
Anion gap: 8 (ref 5–15)
BILIRUBIN TOTAL: 0.8 mg/dL (ref 0.3–1.2)
BUN: 20 mg/dL (ref 6–20)
CALCIUM: 8.2 mg/dL — AB (ref 8.9–10.3)
CO2: 26 mmol/L (ref 22–32)
CREATININE: 1.09 mg/dL — AB (ref 0.44–1.00)
Chloride: 95 mmol/L — ABNORMAL LOW (ref 101–111)
GFR, EST NON AFRICAN AMERICAN: 56 mL/min — AB (ref >60)
Glucose, Bld: 224 mg/dL — ABNORMAL HIGH (ref 65–99)
Potassium: 3.3 mmol/L — ABNORMAL LOW (ref 3.5–5.1)
Sodium: 129 mmol/L — ABNORMAL LOW (ref 135–145)
TOTAL PROTEIN: 6.8 g/dL (ref 6.5–8.1)

## 2017-12-05 MED ORDER — DEXTROSE 5 % IV SOLN
1.0000 g | Freq: Once | INTRAVENOUS | Status: DC
  Filled 2017-12-05: qty 10

## 2017-12-05 MED ORDER — ACETAMINOPHEN 325 MG PO TABS
650.0000 mg | ORAL_TABLET | Freq: Once | ORAL | Status: AC
  Administered 2017-12-05: 650 mg via ORAL
  Filled 2017-12-05: qty 2

## 2017-12-05 NOTE — Telephone Encounter (Signed)
Patient with positive blood culture responded to message left on phone.  Advised to return to ED for possible admission and IV antibiotics. Culture results reviewed by Wyn Quaker, PA-C

## 2017-12-05 NOTE — ED Notes (Signed)
ED Provider at bedside. 

## 2017-12-05 NOTE — ED Triage Notes (Signed)
Pt to ED stating that she was called to return for some type of infection.  Pt has cellulitis in her left leg.  Leg is very painful, red and swolled

## 2017-12-05 NOTE — Progress Notes (Signed)
ED Antimicrobial Stewardship Positive Culture Follow Up   Kaitlyn Castillo is an 56 y.o. female who presented to [facility] on 12/04/2017 with a chief complaint of  Chief Complaint  Patient presents with  . Knee Pain    Recent Results (from the past 720 hour(s))  Urine culture     Status: Abnormal   Collection Time: 12/01/17  3:15 AM  Result Value Ref Range Status   Specimen Description URINE, RANDOM  Final   Special Requests NONE  Final   Culture <10,000 COLONIES/mL INSIGNIFICANT GROWTH (A)  Final   Report Status 12/02/2017 FINAL  Final  Blood culture (routine x 2)     Status: None (Preliminary result)   Collection Time: 12/01/17  2:54 PM  Result Value Ref Range Status   Specimen Description BLOOD RIGHT ANTECUBITAL  Final   Special Requests   Final    BOTTLES DRAWN AEROBIC AND ANAEROBIC Blood Culture adequate volume   Culture NO GROWTH 3 DAYS  Final   Report Status PENDING  Incomplete  Blood culture (routine x 2)     Status: Abnormal   Collection Time: 12/01/17  3:02 PM  Result Value Ref Range Status   Specimen Description BLOOD LEFT ANTECUBITAL  Final   Special Requests   Final    BOTTLES DRAWN AEROBIC AND ANAEROBIC Blood Culture adequate volume   Culture  Setup Time   Final    GRAM POSITIVE COCCI IN CHAINS IN BOTH AEROBIC AND ANAEROBIC BOTTLES CRITICAL RESULT CALLED TO, READ BACK BY AND VERIFIED WITH: K NEAL RN 0500 12/02/17 A BROWNING    Culture STREPTOCOCCUS GROUP G (A)  Final   Report Status 12/04/2017 FINAL  Final   Organism ID, Bacteria STREPTOCOCCUS GROUP G  Final      Susceptibility   Streptococcus group g - MIC*    CLINDAMYCIN <=0.25 SENSITIVE Sensitive     AMPICILLIN <=0.25 SENSITIVE Sensitive     ERYTHROMYCIN <=0.12 SENSITIVE Sensitive     VANCOMYCIN 0.25 SENSITIVE Sensitive     CEFTRIAXONE <=0.12 SENSITIVE Sensitive     LEVOFLOXACIN 0.5 SENSITIVE Sensitive     * STREPTOCOCCUS GROUP G  Blood Culture ID Panel (Reflexed)     Status: Abnormal   Collection  Time: 12/01/17  3:02 PM  Result Value Ref Range Status   Enterococcus species NOT DETECTED NOT DETECTED Final   Listeria monocytogenes NOT DETECTED NOT DETECTED Final   Staphylococcus species NOT DETECTED NOT DETECTED Final   Staphylococcus aureus NOT DETECTED NOT DETECTED Final   Streptococcus species DETECTED (A) NOT DETECTED Final    Comment: Not Enterococcus species, Streptococcus agalactiae, Streptococcus pyogenes, or Streptococcus pneumoniae. CRITICAL RESULT CALLED TO, READ BACK BY AND VERIFIED WITH: K NEAL RN 0500 12/02/17 A BROWNING    Streptococcus agalactiae NOT DETECTED NOT DETECTED Final   Streptococcus pneumoniae NOT DETECTED NOT DETECTED Final   Streptococcus pyogenes NOT DETECTED NOT DETECTED Final   Acinetobacter baumannii NOT DETECTED NOT DETECTED Final   Enterobacteriaceae species NOT DETECTED NOT DETECTED Final   Enterobacter cloacae complex NOT DETECTED NOT DETECTED Final   Escherichia coli NOT DETECTED NOT DETECTED Final   Klebsiella oxytoca NOT DETECTED NOT DETECTED Final   Klebsiella pneumoniae NOT DETECTED NOT DETECTED Final   Proteus species NOT DETECTED NOT DETECTED Final   Serratia marcescens NOT DETECTED NOT DETECTED Final   Haemophilus influenzae NOT DETECTED NOT DETECTED Final   Neisseria meningitidis NOT DETECTED NOT DETECTED Final   Pseudomonas aeruginosa NOT DETECTED NOT DETECTED Final  Candida albicans NOT DETECTED NOT DETECTED Final   Candida glabrata NOT DETECTED NOT DETECTED Final   Candida krusei NOT DETECTED NOT DETECTED Final   Candida parapsilosis NOT DETECTED NOT DETECTED Final   Candida tropicalis NOT DETECTED NOT DETECTED Final     [x]  Patient discharged originally without antimicrobial agent and treatment is now indicated  New antibiotic prescription: Return to hospital for further work-up/cultures due to positive blood culture  ED Provider: Geanie Kenning, PA-C   Susa Raring, PharmD, BCPS 12/05/2017, 9:16 AM Infectious  Diseases Pharmacy Resident  Phone# [phone]

## 2017-12-05 NOTE — Telephone Encounter (Signed)
Post ED Visit - Positive Culture Follow-up: Unsuccessful Patient Follow-up  Culture assessed and recommendations reviewed by:  []  Elenor Quinones, Pharm.D. []  Heide Guile, Pharm.D., BCPS AQ-ID []  Parks Neptune, Pharm.D., BCPS []  Alycia Rossetti, Pharm.D., BCPS []  [place]ahr, [place].D., BCPS, AAHIVP []  Legrand Como, Pharm.D., BCPS, AAHIVP []  Salome Arnt, PharmD, BCPS []  Dimitri Ped, PharmD, BCPS []  Vincenza Hews, PharmD, BCPS  Positive blood culture  []  Patient discharged without antimicrobial prescription and treatment is now indicated []  Organism is resistant to prescribed ED discharge antimicrobial [x]  Patient with positive blood cultures   Unable to contact patient after 3 attempts, letter will be sent to address on file  Ardeen Fillers 12/05/2017, 9:33 AM

## 2017-12-06 ENCOUNTER — Encounter (HOSPITAL_COMMUNITY): Payer: Self-pay | Admitting: Internal Medicine

## 2017-12-06 DIAGNOSIS — E871 Hypo-osmolality and hyponatremia: Secondary | ICD-10-CM | POA: Diagnosis present

## 2017-12-06 DIAGNOSIS — E669 Obesity, unspecified: Secondary | ICD-10-CM | POA: Diagnosis not present

## 2017-12-06 DIAGNOSIS — R7881 Bacteremia: Secondary | ICD-10-CM | POA: Diagnosis present

## 2017-12-06 DIAGNOSIS — Z87891 Personal history of nicotine dependence: Secondary | ICD-10-CM | POA: Diagnosis not present

## 2017-12-06 DIAGNOSIS — E7800 Pure hypercholesterolemia, unspecified: Secondary | ICD-10-CM | POA: Diagnosis present

## 2017-12-06 DIAGNOSIS — Z8349 Family history of other endocrine, nutritional and metabolic diseases: Secondary | ICD-10-CM | POA: Diagnosis not present

## 2017-12-06 DIAGNOSIS — Z91128 Patient's intentional underdosing of medication regimen for other reason: Secondary | ICD-10-CM | POA: Diagnosis not present

## 2017-12-06 DIAGNOSIS — I10 Essential (primary) hypertension: Secondary | ICD-10-CM | POA: Diagnosis present

## 2017-12-06 DIAGNOSIS — I361 Nonrheumatic tricuspid (valve) insufficiency: Secondary | ICD-10-CM | POA: Diagnosis not present

## 2017-12-06 DIAGNOSIS — Z886 Allergy status to analgesic agent status: Secondary | ICD-10-CM | POA: Diagnosis not present

## 2017-12-06 DIAGNOSIS — E1165 Type 2 diabetes mellitus with hyperglycemia: Secondary | ICD-10-CM | POA: Diagnosis present

## 2017-12-06 DIAGNOSIS — Z833 Family history of diabetes mellitus: Secondary | ICD-10-CM | POA: Diagnosis not present

## 2017-12-06 DIAGNOSIS — B955 Unspecified streptococcus as the cause of diseases classified elsewhere: Secondary | ICD-10-CM | POA: Diagnosis not present

## 2017-12-06 DIAGNOSIS — Z91013 Allergy to seafood: Secondary | ICD-10-CM | POA: Diagnosis not present

## 2017-12-06 DIAGNOSIS — Z7984 Long term (current) use of oral hypoglycemic drugs: Secondary | ICD-10-CM | POA: Diagnosis not present

## 2017-12-06 DIAGNOSIS — T383X6A Underdosing of insulin and oral hypoglycemic [antidiabetic] drugs, initial encounter: Secondary | ICD-10-CM | POA: Diagnosis present

## 2017-12-06 DIAGNOSIS — Z885 Allergy status to narcotic agent status: Secondary | ICD-10-CM | POA: Diagnosis not present

## 2017-12-06 DIAGNOSIS — Z91048 Other nonmedicinal substance allergy status: Secondary | ICD-10-CM | POA: Diagnosis not present

## 2017-12-06 DIAGNOSIS — B954 Other streptococcus as the cause of diseases classified elsewhere: Secondary | ICD-10-CM | POA: Diagnosis present

## 2017-12-06 DIAGNOSIS — Z96652 Presence of left artificial knee joint: Secondary | ICD-10-CM | POA: Diagnosis present

## 2017-12-06 DIAGNOSIS — E785 Hyperlipidemia, unspecified: Secondary | ICD-10-CM | POA: Diagnosis present

## 2017-12-06 DIAGNOSIS — E876 Hypokalemia: Secondary | ICD-10-CM | POA: Diagnosis present

## 2017-12-06 DIAGNOSIS — L03116 Cellulitis of left lower limb: Secondary | ICD-10-CM | POA: Diagnosis present

## 2017-12-06 DIAGNOSIS — I959 Hypotension, unspecified: Secondary | ICD-10-CM | POA: Diagnosis present

## 2017-12-06 DIAGNOSIS — Z6839 Body mass index (BMI) 39.0-39.9, adult: Secondary | ICD-10-CM | POA: Diagnosis not present

## 2017-12-06 LAB — HIV ANTIBODY (ROUTINE TESTING W REFLEX): HIV SCREEN 4TH GENERATION: NONREACTIVE

## 2017-12-06 LAB — CULTURE, BLOOD (ROUTINE X 2)
Culture: NO GROWTH
Special Requests: ADEQUATE

## 2017-12-06 LAB — LIPID PANEL
Cholesterol: 130 mg/dL (ref 0–200)
HDL: 34 mg/dL — ABNORMAL LOW (ref >40)
LDL CALC: 83 mg/dL (ref 0–99)
TRIGLYCERIDES: 63 mg/dL (ref <150)
Total CHOL/HDL Ratio: 3.8 RATIO
VLDL: 13 mg/dL (ref 0–40)

## 2017-12-06 LAB — GLUCOSE, CAPILLARY
GLUCOSE-CAPILLARY: 159 mg/dL — AB (ref 65–99)
Glucose-Capillary: 190 mg/dL — ABNORMAL HIGH (ref 65–99)
Glucose-Capillary: 241 mg/dL — ABNORMAL HIGH (ref 65–99)
Glucose-Capillary: 223 mg/dL — ABNORMAL HIGH (ref 65–99)

## 2017-12-06 LAB — BASIC METABOLIC PANEL
Anion gap: 9 (ref 5–15)
BUN: 19 mg/dL (ref 6–20)
CALCIUM: 8.5 mg/dL — AB (ref 8.9–10.3)
CO2: 28 mmol/L (ref 22–32)
CREATININE: 0.97 mg/dL (ref 0.44–1.00)
Chloride: 95 mmol/L — ABNORMAL LOW (ref 101–111)
GFR calc Af Amer: >60 mL/min (ref >60)
GLUCOSE: 217 mg/dL — AB (ref 65–99)
Potassium: 3.3 mmol/L — ABNORMAL LOW (ref 3.5–5.1)
SODIUM: 132 mmol/L — AB (ref 135–145)

## 2017-12-06 LAB — HEMOGLOBIN A1C
HEMOGLOBIN A1C: 7.8 % — AB (ref 4.8–5.6)
Mean Plasma Glucose: 177.16 mg/dL

## 2017-12-06 LAB — CBC
HCT: 32.5 % — ABNORMAL LOW (ref 36.0–46.0)
Hemoglobin: 10.8 g/dL — ABNORMAL LOW (ref 12.0–15.0)
MCH: 27.1 pg (ref 26.0–34.0)
MCHC: 33.2 g/dL (ref 30.0–36.0)
MCV: 81.7 fL (ref 78.0–100.0)
PLATELETS: 261 10*3/uL (ref 150–400)
RBC: 3.98 MIL/uL (ref 3.87–5.11)
RDW: 14.1 % (ref 11.5–15.5)
WBC: 9.8 10*3/uL (ref 4.0–10.5)

## 2017-12-06 LAB — CREATININE, SERUM
CREATININE: 1.01 mg/dL — AB (ref 0.44–1.00)
GFR calc Af Amer: >60 mL/min (ref >60)

## 2017-12-06 MED ORDER — HYDROCODONE-ACETAMINOPHEN 5-325 MG PO TABS
1.0000 | ORAL_TABLET | Freq: Four times a day (QID) | ORAL | Status: DC | PRN
  Administered 2017-12-06 – 2017-12-07 (×3): 2 via ORAL
  Filled 2017-12-06 (×4): qty 2

## 2017-12-06 MED ORDER — DICLOFENAC SODIUM 1 % TD GEL
2.0000 g | Freq: Four times a day (QID) | TRANSDERMAL | Status: DC | PRN
  Filled 2017-12-06: qty 100

## 2017-12-06 MED ORDER — EXENATIDE 10 MCG/0.04ML ~~LOC~~ SOPN
10.0000 ug | PEN_INJECTOR | Freq: Two times a day (BID) | SUBCUTANEOUS | Status: DC
  Administered 2017-12-06 – 2017-12-08 (×3): 10 ug via SUBCUTANEOUS
  Filled 2017-12-06 (×2): qty 2.4

## 2017-12-06 MED ORDER — ACETAMINOPHEN 325 MG PO TABS
650.0000 mg | ORAL_TABLET | Freq: Four times a day (QID) | ORAL | Status: DC | PRN
Start: 2017-12-06 — End: 2017-12-08

## 2017-12-06 MED ORDER — HYDRALAZINE HCL 20 MG/ML IJ SOLN: 10.0000 mg | INTRAMUSCULAR | Status: DC | PRN

## 2017-12-06 MED ORDER — INSULIN ASPART 100 UNIT/ML ~~LOC~~ SOLN
0.0000 [IU] | Freq: Three times a day (TID) | SUBCUTANEOUS | Status: DC
  Administered 2017-12-06: 2 [IU] via SUBCUTANEOUS
  Administered 2017-12-06: 3 [IU] via SUBCUTANEOUS

## 2017-12-06 MED ORDER — ONDANSETRON HCL 4 MG PO TABS: 4.0000 mg | ORAL_TABLET | Freq: Four times a day (QID) | ORAL | Status: DC | PRN

## 2017-12-06 MED ORDER — DEXTROSE 5 % IV SOLN
2.0000 g | Freq: Every day | INTRAVENOUS | Status: DC
  Administered 2017-12-06 – 2017-12-07 (×3): 2 g via INTRAVENOUS
  Filled 2017-12-06 (×4): qty 2

## 2017-12-06 MED ORDER — ONDANSETRON HCL 4 MG/2ML IJ SOLN: 4.0000 mg | Freq: Four times a day (QID) | INTRAMUSCULAR | Status: DC | PRN

## 2017-12-06 MED ORDER — SODIUM CHLORIDE 0.9 % IV SOLN
INTRAVENOUS | Status: AC
  Administered 2017-12-06 (×3): via INTRAVENOUS

## 2017-12-06 MED ORDER — POTASSIUM CHLORIDE CRYS ER 20 MEQ PO TBCR
60.0000 meq | EXTENDED_RELEASE_TABLET | Freq: Once | ORAL | Status: AC
  Administered 2017-12-06: 60 meq via ORAL
  Filled 2017-12-06: qty 3

## 2017-12-06 MED ORDER — INSULIN ASPART 100 UNIT/ML ~~LOC~~ SOLN
0.0000 [IU] | SUBCUTANEOUS | Status: DC
  Administered 2017-12-06: 5 [IU] via SUBCUTANEOUS
  Administered 2017-12-07: 2 [IU] via SUBCUTANEOUS

## 2017-12-06 MED ORDER — LISINOPRIL 20 MG PO TABS
20.0000 mg | ORAL_TABLET | Freq: Every day | ORAL | Status: DC
  Administered 2017-12-06: 20 mg via ORAL
  Filled 2017-12-06: qty 1

## 2017-12-06 MED ORDER — ACETAMINOPHEN 650 MG RE SUPP: 650.0000 mg | Freq: Four times a day (QID) | RECTAL | Status: DC | PRN

## 2017-12-06 MED ORDER — ENOXAPARIN SODIUM 40 MG/0.4ML ~~LOC~~ SOLN
40.0000 mg | Freq: Every day | SUBCUTANEOUS | Status: DC
  Administered 2017-12-07 – 2017-12-08 (×2): 40 mg via SUBCUTANEOUS
  Filled 2017-12-06 (×3): qty 0.4

## 2017-12-06 NOTE — Progress Notes (Signed)
Reviewed and provided patient with cellulitis education.

## 2017-12-06 NOTE — H&P (Signed)
History and Physical    Kaitlyn Castillo OEU:[id number] DOB: 01/06/1961 DOA: 12/05/2017  PCP: Benito Mccreedy, MD  Patient coming from: Home.  Chief Complaint: Positive blood cultures.  HPI: Kaitlyn Castillo is a 56 y.o. female with history of diabetes mellitus type 2, hypertension presents to the ER after patient was told her blood cultures came back positive.  On December 01, 2017 patient had come to the ER after patient had fever and chills while at the [facility].  Patient was observed in the ER for severe fever at that time blood cultures were obtained and since patient was asymptomatic was discharged home.  The following day patient started developing left leg swelling which gradually worsened and patient came back to the ER on Saturday, December 03, 2017.  Studies were DVT were negative and patient was discharged home on oral antibiotics.  Since then patient swelling has remained same.  But was told to come to the ER today since blood cultures came back strep positive.  ED Course: In the ER patient was started on ceftriaxone as blood cultures were sensitive to ceftriaxone.  Patient otherwise denies any chest pain shortness of breath nausea vomiting abdominal pain or diarrhea.  Has not taken her Byetta for last 3 days after the incident.  Blood sugar remains high.  Has mild hyponatremia.  Review of Systems: As per HPI, rest all negative.   Past Medical History:  Diagnosis Date  . Arthritis   . Constipation   . Diabetes mellitus   . Head injury, closed, with brief LOC ([place])   . High cholesterol   . Hypertension   . Knee pain, right   . Obese   . Shortness of breath    with too much fluid- not currently    Past Surgical History:  Procedure Laterality Date  . carbuncle     back of head  . CESAREAN SECTION    . COLONOSCOPY    . KNEE SURGERY     quad  . QUADRICEPS REPAIR    . TOTAL KNEE ARTHROPLASTY Left 08/13/2014   Procedure: TOTAL KNEE ARTHROPLASTY;  Kessner: Meredith Pel, MD;  Location: [facility];  Service: Orthopedics;  Laterality: Left;     reports that she quit smoking about 39 years ago. Her smoking use included cigarettes. She quit after 0.00 years of use. she has never used smokeless tobacco. She reports that she does not drink alcohol or use drugs.  Allergies  Allergen Reactions  . Iodine Anaphylaxis and Hives    Certain iodine in fish  . Shellfish Allergy Anaphylaxis    Iodine breaks her out and "stops her breathing"  . Aleve [Naproxen Sodium] Hives  . Iohexol Hives    Can possibly tolerate with Benadryl (but ask first??)   . Percocet [Oxycodone-Acetaminophen] Hives, Itching and Other (See Comments)    Causes paranoia    Family History  Problem Relation Age of Onset  . Diabetes Mother   . Hyperlipidemia Mother   . Hypertension Mother   . Diabetes Father   . Hyperlipidemia Father   . Hypertension Father   . Cancer Brother   . Heart disease Brother   . Hypertension Brother   . Heart attack Brother     Prior to Admission medications   Medication Sig Start Date End Date Taking? Authorizing Provider  acetaminophen (TYLENOL) 500 MG tablet Take 500-1,000 mg by mouth every 6 (six) hours as needed (for pain).   Yes [provider]  acetaminophen-codeine (TYLENOL #3)  300-30 MG tablet Take 1 tablet by mouth every 8 (eight) hours as needed for moderate pain. Patient taking differently: Take 1 tablet by mouth every 6 (six) hours as needed for moderate pain.  08/11/17  Yes Meredith Pel, MD  doxycycline (VIBRAMYCIN) 100 MG capsule Take 1 capsule (100 mg total) by mouth 2 (two) times daily. Patient taking differently: Take 100 mg by mouth 2 (two) times daily. FOR 10 DAYS 12/04/17  Yes Horton, Barbette Hair, MD  exenatide (BYETTA 10 MCG PEN) 10 MCG/0.04ML SOPN injection Inject 10 mcg into the skin 2 (two) times daily with a meal.   Yes [provider]  HYDROcodone-acetaminophen (NORCO/VICODIN) 5-325 MG tablet Take 1-2 tablets by  mouth every 6 (six) hours as needed. Patient taking differently: Take 1-2 tablets by mouth every 6 (six) hours as needed for moderate pain.  12/04/17  Yes Horton, Barbette Hair, MD  ibuprofen (ADVIL,MOTRIN) 200 MG tablet Take 200-400 mg by mouth every 6 (six) hours as needed (for pain).   Yes [provider]  lisinopril-hydrochlorothiazide (PRINZIDE,ZESTORETIC) 20-12.5 MG tablet Take 1 tablet by mouth daily. 11/28/17  Yes [provider]  Omega-3 Fatty Acids (FISH OIL PO) Take 1 capsule by mouth 3 (three) times a week.    Yes [provider]  Prenatal Vit w/Fe-Methylfol-FA (PNV PO) Take 1 tablet by mouth daily.   Yes [provider]  VOLTAREN 1 % GEL Apply 2-4 g topically See admin instructions. Three to four times a day to affected sites as needed for pain 11/11/17  Yes [provider]  acetaminophen-codeine (TYLENOL #3) 300-30 MG per tablet Take 1-2 tablets by mouth every 6 (six) hours as needed. Patient not taking: Reported on 12/05/2017 08/25/15   Luvenia Redden, PA-C  benzonatate (TESSALON) 100 MG capsule Take 1 capsule (100 mg total) by mouth every 8 (eight) hours. Patient not taking: Reported on 12/05/2017 01/23/17   Mercy Riding, MD  megestrol (MEGACE) 40 MG tablet Take 1 tablet (40 mg total) by mouth 2 (two) times daily. Patient not taking: Reported on 12/05/2017 08/25/15   Luvenia Redden, PA-C  methocarbamol (ROBAXIN) 500 MG tablet Take 1 tablet (500 mg total) by mouth at bedtime as needed for muscle spasms. Patient not taking: Reported on 12/05/2017 08/21/17   Emeline General, PA-C  oseltamivir (TAMIFLU) 75 MG capsule Take 1 capsule (75 mg total) by mouth 2 (two) times daily. Patient not taking: Reported on 12/05/2017 01/23/17   Mercy Riding, MD    Physical Exam: Vitals:   12/05/17 2001 12/05/17 2245 12/05/17 2330 12/05/17 2337  BP: 131/67 (!) 134/92 136/61 136/61  Pulse: 68 (!) 108  71  Resp: 16 16  20   Temp:      TempSrc:      SpO2:  100% 94%  99%  Weight:      Height:          Constitutional: Moderately built and nourished. Vitals:   12/05/17 2001 12/05/17 2245 12/05/17 2330 12/05/17 2337  BP: 131/67 (!) 134/92 136/61 136/61  Pulse: 68 (!) 108  71  Resp: 16 16  20   Temp:      TempSrc:      SpO2: 100% 94%  99%  Weight:      Height:       Eyes: Anicteric no pallor. ENMT: No discharge from the ears eyes nose and mouth. Neck: No mass felt.  No neck rigidity. Respiratory: No rhonchi or crepitations. Cardiovascular: S1-S2 heard no  murmurs appreciated. Abdomen: Soft nontender bowel sounds present. Musculoskeletal: Erythema and edema of the left lower extremity extending from the foot to the inferior part of the knee. Skin: Erythema of the left lower extremity extending from the foot to the knee. Neurologic: Alert awake oriented to time place and person.  Moves all extremities. Psychiatric: Appears normal.  Normal affect.   Labs on Admission: I have personally reviewed following labs and imaging studies  CBC: Recent Labs  Lab 12/01/17 1454 12/04/17 0523 12/05/17 1756  WBC 6.2 6.7 10.2  NEUTROABS 5.1 5.5 8.1*  HGB 11.7* 11.9* 10.7*  HCT 35.1* 35.1* 32.6*  MCV 82.4 82.4 81.7  PLT 231 185 242   Basic Metabolic Panel: Recent Labs  Lab 12/01/17 1454 12/04/17 0523 12/05/17 1756  NA 134* 131* 129*  K 3.6 3.6 3.3*  CL 101 98* 95*  CO2 26 24 26   GLUCOSE 152* 184* 224*  BUN 20 25* 20  CREATININE 1.03* 1.10* 1.09*  CALCIUM 9.1 8.4* 8.2*   GFR: Estimated Creatinine Clearance: 67.7 mL/min (A) (by C-G formula based on SCr of 1.09 mg/dL (H)). Liver Function Tests: Recent Labs  Lab 12/01/17 1454 12/05/17 1756  AST 31 27  ALT 29 34  ALKPHOS 41 47  BILITOT 0.8 0.8  PROT 7.2 6.8  ALBUMIN 3.8 3.0*   No results for input(s): LIPASE, AMYLASE in the last 168 hours. No results for input(s): AMMONIA in the last 168 hours. Coagulation Profile: No results for input(s): INR, PROTIME in the last 168  hours. Cardiac Enzymes: Recent Labs  Lab 12/01/17 1501 12/01/17 2115  CKTOTAL 388* 308*   BNP (last 3 results) No results for input(s): PROBNP in the last 8760 hours. HbA1C: No results for input(s): HGBA1C in the last 72 hours. CBG: Recent Labs  Lab 12/01/17 2033  GLUCAP 159*   Lipid Profile: No results for input(s): CHOL, HDL, LDLCALC, TRIG, CHOLHDL, LDLDIRECT in the last 72 hours. Thyroid Function Tests: No results for input(s): TSH, T4TOTAL, FREET4, T3FREE, THYROIDAB in the last 72 hours. Anemia Panel: No results for input(s): VITAMINB12, FOLATE, FERRITIN, TIBC, IRON, RETICCTPCT in the last 72 hours. Urine analysis:    Component Value Date/Time   COLORURINE YELLOW 12/01/2017 1450   APPEARANCEUR HAZY (A) 12/01/2017 1450   LABSPEC 1.014 12/01/2017 1450   PHURINE 7.0 12/01/2017 1450   GLUCOSEU NEGATIVE 12/01/2017 1450   HGBUR NEGATIVE 12/01/2017 1450   BILIRUBINUR NEGATIVE 12/01/2017 1450   KETONESUR NEGATIVE 12/01/2017 1450   PROTEINUR NEGATIVE 12/01/2017 1450   UROBILINOGEN 0.2 08/25/2015 2024   NITRITE NEGATIVE 12/01/2017 1450   LEUKOCYTESUR NEGATIVE 12/01/2017 1450   Sepsis Labs: @LABRCNTIP (procalcitonin:4,lacticidven:4) ) Recent Results (from the past 240 hour(s))  Urine culture     Status: Abnormal   Collection Time: 12/01/17  3:15 AM  Result Value Ref Range Status   Specimen Description URINE, RANDOM  Final   Special Requests NONE  Final   Culture <10,000 COLONIES/mL INSIGNIFICANT GROWTH (A)  Final   Report Status 12/02/2017 FINAL  Final  Blood culture (routine x 2)     Status: None (Preliminary result)   Collection Time: 12/01/17  2:54 PM  Result Value Ref Range Status   Specimen Description BLOOD RIGHT ANTECUBITAL  Final   Special Requests   Final    BOTTLES DRAWN AEROBIC AND ANAEROBIC Blood Culture adequate volume   Culture NO GROWTH 4 DAYS  Final   Report Status PENDING  Incomplete  Blood culture (routine x 2)     Status:  Abnormal   Collection  Time: 12/01/17  3:02 PM  Result Value Ref Range Status   Specimen Description BLOOD LEFT ANTECUBITAL  Final   Special Requests   Final    BOTTLES DRAWN AEROBIC AND ANAEROBIC Blood Culture adequate volume   Culture  Setup Time   Final    GRAM POSITIVE COCCI IN CHAINS IN BOTH AEROBIC AND ANAEROBIC BOTTLES CRITICAL RESULT CALLED TO, READ BACK BY AND VERIFIED WITH: K NEAL RN 0500 12/02/17 A BROWNING    Culture STREPTOCOCCUS GROUP G (A)  Final   Report Status 12/04/2017 FINAL  Final   Organism ID, Bacteria STREPTOCOCCUS GROUP G  Final      Susceptibility   Streptococcus group g - MIC*    CLINDAMYCIN <=0.25 SENSITIVE Sensitive     AMPICILLIN <=0.25 SENSITIVE Sensitive     ERYTHROMYCIN <=0.12 SENSITIVE Sensitive     VANCOMYCIN 0.25 SENSITIVE Sensitive     CEFTRIAXONE <=0.12 SENSITIVE Sensitive     LEVOFLOXACIN 0.5 SENSITIVE Sensitive     * STREPTOCOCCUS GROUP G  Blood Culture ID Panel (Reflexed)     Status: Abnormal   Collection Time: 12/01/17  3:02 PM  Result Value Ref Range Status   Enterococcus species NOT DETECTED NOT DETECTED Final   Listeria monocytogenes NOT DETECTED NOT DETECTED Final   Staphylococcus species NOT DETECTED NOT DETECTED Final   Staphylococcus aureus NOT DETECTED NOT DETECTED Final   Streptococcus species DETECTED (A) NOT DETECTED Final    Comment: Not Enterococcus species, Streptococcus agalactiae, Streptococcus pyogenes, or Streptococcus pneumoniae. CRITICAL RESULT CALLED TO, READ BACK BY AND VERIFIED WITH: K NEAL RN 0500 12/02/17 A BROWNING    Streptococcus agalactiae NOT DETECTED NOT DETECTED Final   Streptococcus pneumoniae NOT DETECTED NOT DETECTED Final   Streptococcus pyogenes NOT DETECTED NOT DETECTED Final   Acinetobacter baumannii NOT DETECTED NOT DETECTED Final   Enterobacteriaceae species NOT DETECTED NOT DETECTED Final   Enterobacter cloacae complex NOT DETECTED NOT DETECTED Final   Escherichia coli NOT DETECTED NOT DETECTED Final   Klebsiella  oxytoca NOT DETECTED NOT DETECTED Final   Klebsiella pneumoniae NOT DETECTED NOT DETECTED Final   Proteus species NOT DETECTED NOT DETECTED Final   Serratia marcescens NOT DETECTED NOT DETECTED Final   Haemophilus influenzae NOT DETECTED NOT DETECTED Final   Neisseria meningitidis NOT DETECTED NOT DETECTED Final   Pseudomonas aeruginosa NOT DETECTED NOT DETECTED Final   Candida albicans NOT DETECTED NOT DETECTED Final   Candida glabrata NOT DETECTED NOT DETECTED Final   Candida krusei NOT DETECTED NOT DETECTED Final   Candida parapsilosis NOT DETECTED NOT DETECTED Final   Candida tropicalis NOT DETECTED NOT DETECTED Final     Radiological Exams on Admission: Dg Tibia/fibula Left  Result Date: 12/04/2017 CLINICAL DATA:  56 year old female with left lower extremity redness and swelling. EXAM: LEFT TIBIA AND FIBULA - 2 VIEW COMPARISON:  None. FINDINGS: There is a total left knee arthroplasty which appears intact and in anatomic alignment. There is no acute fracture or dislocation. A small suprapatellar effusion may be present. There is degenerative changes of the tarsal bones. Mild diffuse subcutaneous edema may represent cellulitis. Clinical correlation is recommended. No soft tissue air. IMPRESSION: 1. No acute fracture or dislocation. 2. Left knee arthroplasty appears intact. 3. Mild diffuse subcutaneous edema, likely cellulitis. Clinical correlation is recommended. No soft tissue air. Electronically Signed   By: Anner Crete M.D.   On: 12/04/2017 03:37     Assessment/Plan Principal Problem:   Streptococcal bacteremia Active  Problems:   Essential hypertension   Uncontrolled type 2 diabetes mellitus with hyperglycemia (HCC)   Hyponatremia   Bacteremia    1. Streptococcal bacteremia likely source could be left leg cellulitis -cultures are sensitive to ceftriaxone and patient has been placed on ceftriaxone.  May discuss with infectious disease in a.m.  No signs of compartment  syndrome at this time. 2. Uncontrolled diabetes mellitus type 2 -patient has not taken about the last 3 days.  Patient has been placed back on it.  I have also placed patient on sliding scale coverage.  Closely follow blood sugar trends. 3. Hyponatremia likely from dehydration and hyperglycemia -will hold hydrochlorothiazide for now and gently hydrate.  Follow metabolic panel. 4. Hypertension -on lisinopril.  Which will be continued I am holding hydrochlorothiazide due to hyponatremia.  I have placed patient on as needed IV hydralazine.   DVT prophylaxis: Lovenox. Code Status: Full code. Family Communication: Discussed with patient. Disposition Plan: Home. Consults called: None. Admission status: Inpatient.   Rise Patience MD [facility] Pager [phone].  If 7PM-7AM, please contact night-coverage [email or web address] Password [email or web address]  12/06/2017, 12:28 AM

## 2017-12-06 NOTE — Progress Notes (Signed)
PROGRESS NOTE    Kaitlyn Castillo  EUM:[id number] DOB: 1961/07/05 DOA: 12/05/2017 PCP: Benito Mccreedy, MD   Brief Narrative:  56 y.o. BF PMHx diabetes mellitus type 2, HTN, HLD, morbid obesity,    Presents to the ER after patient was told her blood cultures came back positive.  On December 01, 2017 patient had come to the ER after patient had fever and chills while at the [facility].  Patient was observed in the ER for severe fever at that time blood cultures were obtained and since patient was asymptomatic was discharged home.  The following day patient started developing left leg swelling which gradually worsened and patient came back to the ER on Saturday, December 03, 2017.  Studies were DVT were negative and patient was discharged home on oral antibiotics.  Since then patient swelling has remained same.  But was told to come to the ER today since blood cultures came back strep positive.   ED Course: In the ER patient was started on ceftriaxone as blood cultures were sensitive to ceftriaxone.  Patient otherwise denies any chest pain shortness of breath nausea vomiting abdominal pain or diarrhea.  Has not taken her Byetta for last 3 days after the incident.  Blood sugar remains high.  Has mild hyponatremia.    Subjective: 12/25 A/O x4, negative CP, negative S OB, negative abdominal pain.  Positive LLE pain to palpation.   Assessment & Plan:   Principal Problem:   Streptococcal bacteremia Active Problems:   Essential hypertension   Uncontrolled type 2 diabetes mellitus with hyperglycemia (HCC)   Hyponatremia   Bacteremia  Bacteremia Streptococcal -continue current antibiotics:  - Echocardiogram pending -Most likely will require TEE - Consult ID?  Diabetes type 2/Hyperglycemia -Hemoglobin A1c pending -Lipid panel pending -Moderate SSI  Hyponatremia - Most likely hypovolemic hyponatremia -Normal saline 190ml/hr  Hypokalemia -K-Dur 60 mEq  Essential HTN -Patient  currently hypotensive hold lisinopril 20 mg daily      DVT prophylaxis: Lovenox  Code Status: Full Family Communication: Family at bedside Disposition Plan: TPD   Consultants:  None  Procedures/Significant Events:  12/23 bilateral lower extremity Doppler: Negative DVT/SVT   I have personally reviewed and interpreted all radiology studies and my findings are as above.  VENTILATOR SETTINGS:    Cultures 12/20 urine positive insignificant growth 12/20 blood positive Streptococcus group G: 12/25 blood pending  Antimicrobials: Anti-infectives (From admission, onward)   Start     Stop   12/06/17 0030  cefTRIAXone (ROCEPHIN) 2 g in dextrose 5 % 50 mL IVPB         12/06/17 0000  cefTRIAXone (ROCEPHIN) 1 g in dextrose 5 % 50 mL IVPB  Status:  Discontinued     12/06/17 0019       Devices    LINES / TUBES:      Continuous Infusions: . sodium chloride 125 mL/hr at 12/06/17 1817  . cefTRIAXone (ROCEPHIN)  IV Stopped (12/06/17 0111)     Objective: Vitals:   12/06/17 0100 12/06/17 0148 12/06/17 0623 12/06/17 1700  BP: 118/70 (!) 114/48 127/61 (!) 93/56  Pulse: 70 73 78 75  Resp:  18 19 18   Temp:  97.9 F (36.6 C) 98.3 F (36.8 C) 98.7 F (37.1 C)  TempSrc:  Oral Oral Oral  SpO2: 100% 100% 100% 100%  Weight:      Height:        Intake/Output Summary (Last 24 hours) at 12/06/2017 2008 Last data filed at 12/06/2017 1823 Gross per 24 hour  Intake 2734.09 ml  Output -  Net 2734.09 ml   Filed Weights   12/05/17 1738  Weight: 229 lb (103.9 kg)    Examination:  General: A/O x4, No acute respiratory distress Neck:  Negative scars, masses, torticollis, lymphadenopathy, JVD Lungs: Clear to auscultation bilaterally without wheezes or crackles Cardiovascular: Regular rate and rhythm without murmur gallop or rub normal S1 and S2 Abdomen: Morbidly obese, negative abdominal pain, nondistended, positive soft, bowel sounds, no rebound, no ascites, no appreciable  mass Extremities: bilateral lower extremity edema LEFT >>>>>> RIGHT.  LEFT lower extremity erythematous warm to touch painful to touch firm to touch  Psychiatric:  Negative depression, negative anxiety, negative fatigue, negative mania  Central nervous system:  Cranial nerves II through XII intact, tongue/uvula midline, all extremities muscle strength 5/5, sensation intact throughout,  negative dysarthria, negative expressive aphasia, negative receptive aphasia.  .     Data Reviewed: Care during the described time interval was provided by me .  I have reviewed this patient's available data, including medical history, events of note, physical examination, and all test results as part of my evaluation.   CBC: Recent Labs  Lab 12/01/17 1454 12/04/17 0523 12/05/17 1756 12/06/17 0615  WBC 6.2 6.7 10.2 9.8  NEUTROABS 5.1 5.5 8.1*  --   HGB 11.7* 11.9* 10.7* 10.8*  HCT 35.1* 35.1* 32.6* 32.5*  MCV 82.4 82.4 81.7 81.7  PLT 231 185 227 841   Basic Metabolic Panel: Recent Labs  Lab 12/01/17 1454 12/04/17 0523 12/05/17 1756 12/06/17 0606 12/06/17 0615  NA 134* 131* 129* 132*  --   K 3.6 3.6 3.3* 3.3*  --   CL 101 98* 95* 95*  --   CO2 26 24 26 28   --   GLUCOSE 152* 184* 224* 217*  --   BUN 20 25* 20 19  --   CREATININE 1.03* 1.10* 1.09* 0.97 1.01*  CALCIUM 9.1 8.4* 8.2* 8.5*  --    GFR: Estimated Creatinine Clearance: 73 mL/min (A) (by C-G formula based on SCr of 1.01 mg/dL (H)). Liver Function Tests: Recent Labs  Lab 12/01/17 1454 12/05/17 1756  AST 31 27  ALT 29 34  ALKPHOS 41 47  BILITOT 0.8 0.8  PROT 7.2 6.8  ALBUMIN 3.8 3.0*   No results for input(s): LIPASE, AMYLASE in the last 168 hours. No results for input(s): AMMONIA in the last 168 hours. Coagulation Profile: No results for input(s): INR, PROTIME in the last 168 hours. Cardiac Enzymes: Recent Labs  Lab 12/01/17 1501 12/01/17 2115  CKTOTAL 388* 308*   BNP (last 3 results) No results for input(s):  PROBNP in the last 8760 hours. HbA1C: No results for input(s): HGBA1C in the last 72 hours. CBG: Recent Labs  Lab 12/01/17 2033 12/06/17 0620 12/06/17 1257 12/06/17 1628  GLUCAP 159* 190* 241* 159*   Lipid Profile: No results for input(s): CHOL, HDL, LDLCALC, TRIG, CHOLHDL, LDLDIRECT in the last 72 hours. Thyroid Function Tests: No results for input(s): TSH, T4TOTAL, FREET4, T3FREE, THYROIDAB in the last 72 hours. Anemia Panel: No results for input(s): VITAMINB12, FOLATE, FERRITIN, TIBC, IRON, RETICCTPCT in the last 72 hours. Urine analysis:    Component Value Date/Time   COLORURINE YELLOW 12/01/2017 1450   APPEARANCEUR HAZY (A) 12/01/2017 1450   LABSPEC 1.014 12/01/2017 1450   PHURINE 7.0 12/01/2017 1450   GLUCOSEU NEGATIVE 12/01/2017 1450   HGBUR NEGATIVE 12/01/2017 1450   BILIRUBINUR NEGATIVE 12/01/2017 1450   KETONESUR NEGATIVE 12/01/2017 1450   PROTEINUR NEGATIVE  12/01/2017 1450   UROBILINOGEN 0.2 08/25/2015 2024   NITRITE NEGATIVE 12/01/2017 1450   LEUKOCYTESUR NEGATIVE 12/01/2017 1450   Sepsis Labs: @LABRCNTIP (procalcitonin:4,lacticidven:4)  ) Recent Results (from the past 240 hour(s))  Urine culture     Status: Abnormal   Collection Time: 12/01/17  3:15 AM  Result Value Ref Range Status   Specimen Description URINE, RANDOM  Final   Special Requests NONE  Final   Culture <10,000 COLONIES/mL INSIGNIFICANT GROWTH (A)  Final   Report Status 12/02/2017 FINAL  Final  Blood culture (routine x 2)     Status: None   Collection Time: 12/01/17  2:54 PM  Result Value Ref Range Status   Specimen Description BLOOD RIGHT ANTECUBITAL  Final   Special Requests   Final    BOTTLES DRAWN AEROBIC AND ANAEROBIC Blood Culture adequate volume   Culture NO GROWTH 5 DAYS  Final   Report Status 12/06/2017 FINAL  Final  Blood culture (routine x 2)     Status: Abnormal   Collection Time: 12/01/17  3:02 PM  Result Value Ref Range Status   Specimen Description BLOOD LEFT ANTECUBITAL   Final   Special Requests   Final    BOTTLES DRAWN AEROBIC AND ANAEROBIC Blood Culture adequate volume   Culture  Setup Time   Final    GRAM POSITIVE COCCI IN CHAINS IN BOTH AEROBIC AND ANAEROBIC BOTTLES CRITICAL RESULT CALLED TO, READ BACK BY AND VERIFIED WITH: K NEAL RN 0500 12/02/17 A BROWNING    Culture STREPTOCOCCUS GROUP G (A)  Final   Report Status 12/04/2017 FINAL  Final   Organism ID, Bacteria STREPTOCOCCUS GROUP G  Final      Susceptibility   Streptococcus group g - MIC*    CLINDAMYCIN <=0.25 SENSITIVE Sensitive     AMPICILLIN <=0.25 SENSITIVE Sensitive     ERYTHROMYCIN <=0.12 SENSITIVE Sensitive     VANCOMYCIN 0.25 SENSITIVE Sensitive     CEFTRIAXONE <=0.12 SENSITIVE Sensitive     LEVOFLOXACIN 0.5 SENSITIVE Sensitive     * STREPTOCOCCUS GROUP G  Blood Culture ID Panel (Reflexed)     Status: Abnormal   Collection Time: 12/01/17  3:02 PM  Result Value Ref Range Status   Enterococcus species NOT DETECTED NOT DETECTED Final   Listeria monocytogenes NOT DETECTED NOT DETECTED Final   Staphylococcus species NOT DETECTED NOT DETECTED Final   Staphylococcus aureus NOT DETECTED NOT DETECTED Final   Streptococcus species DETECTED (A) NOT DETECTED Final    Comment: Not Enterococcus species, Streptococcus agalactiae, Streptococcus pyogenes, or Streptococcus pneumoniae. CRITICAL RESULT CALLED TO, READ BACK BY AND VERIFIED WITH: K NEAL RN 0500 12/02/17 A BROWNING    Streptococcus agalactiae NOT DETECTED NOT DETECTED Final   Streptococcus pneumoniae NOT DETECTED NOT DETECTED Final   Streptococcus pyogenes NOT DETECTED NOT DETECTED Final   Acinetobacter baumannii NOT DETECTED NOT DETECTED Final   Enterobacteriaceae species NOT DETECTED NOT DETECTED Final   Enterobacter cloacae complex NOT DETECTED NOT DETECTED Final   Escherichia coli NOT DETECTED NOT DETECTED Final   Klebsiella oxytoca NOT DETECTED NOT DETECTED Final   Klebsiella pneumoniae NOT DETECTED NOT DETECTED Final    Proteus species NOT DETECTED NOT DETECTED Final   Serratia marcescens NOT DETECTED NOT DETECTED Final   Haemophilus influenzae NOT DETECTED NOT DETECTED Final   Neisseria meningitidis NOT DETECTED NOT DETECTED Final   Pseudomonas aeruginosa NOT DETECTED NOT DETECTED Final   Candida albicans NOT DETECTED NOT DETECTED Final   Candida glabrata NOT DETECTED NOT DETECTED  Final   Candida krusei NOT DETECTED NOT DETECTED Final   Candida parapsilosis NOT DETECTED NOT DETECTED Final   Candida tropicalis NOT DETECTED NOT DETECTED Final         Radiology Studies: No results found.      Scheduled Meds: . enoxaparin (LOVENOX) injection  40 mg Subcutaneous Daily  . exenatide  10 mcg Subcutaneous BID WC  . insulin aspart  0-9 Units Subcutaneous TID WC  . lisinopril  20 mg Oral Daily   Continuous Infusions: . sodium chloride 125 mL/hr at 12/06/17 1817  . cefTRIAXone (ROCEPHIN)  IV Stopped (12/06/17 0111)     LOS: 0 days    Time spent: 40 minutes    Tadao Emig, Geraldo Docker, MD [facility] Pager [phone]   If 7PM-7AM, please contact night-coverage [email or web address] Password [facility] 12/06/2017, 8:08 PM

## 2017-12-06 NOTE — Progress Notes (Signed)
Patient refuses to take insulin. States they take only Byetta at home. Patient family member has brought at home medication, medication has been taken to pharmacy per RN, pharmacy form has been filled out and in patients file.

## 2017-12-06 NOTE — ED Provider Notes (Signed)
[facility] EMERGENCY DEPARTMENT Provider Note   CSN: [id number] Arrival date & time: 12/05/17  1700     History   Chief Complaint Chief Complaint  Patient presents with  . Leg Pain    HPI Kaitlyn Castillo is a 56 y.o. female.  HPI Patient with history of diabetes comes in with chief complaint of abnormal lab. Patient reports that she was called by the hospital and informed that she had positive blood cultures, and advised to return to the ER.  Per patient she was seen in the ED 2 days ago with left lower extremity swelling.  Patient had a DVT scan which was negative.  Patient was started on doxycycline.  It appears that the blood cultures came back positive for Streptococcus, that is pan-susceptible.  There was no MRSA.Marland Kitchen  Patient reports that over the past 2 days her leg swelling has gotten worse.  Patient denies any numbness, tingling.  Patient also denies any new fevers or chills.   Past Medical History:  Diagnosis Date  . Arthritis   . Constipation   . Diabetes mellitus   . Head injury, closed, with brief LOC ([place])   . High cholesterol   . Hypertension   . Knee pain, right   . Obese   . Shortness of breath    with too much fluid- not currently    Patient Active Problem List   Diagnosis Date Noted  . Streptococcal bacteremia 12/06/2017  . Uncontrolled type 2 diabetes mellitus with hyperglycemia ([place]) 12/06/2017  . Hyponatremia 12/06/2017  . Bacteremia 12/06/2017  . Heat exposure 12/01/2017  . Hyperthermia   . Postmenopausal bleeding 08/27/2015  . Adenomyosis 08/27/2015  . Skin ulcer of left foot including toes ([place]) 05/02/2015  . Equinus deformity of foot, acquired 04/02/2015  . Tenosynovitis of left foot 04/02/2015  . Arthritis of knee 08/13/2014  . Pain in lower limb 04/24/2014  . Onychomycosis 11/02/2013  . Pain, foot 11/02/2013  . Acquired deformity of toe 11/02/2013  . Unspecified venous (peripheral) insufficiency 09/13/2012  .  Cellulitis of left leg 07/07/2012  . ARF (acute renal failure) ([place]) 07/07/2012  . LEG PAIN, LEFT 01/10/2008  . ANXIETY 11/19/2007  . DIABETIC FOOT ULCER, TOE 11/17/2007  . CHEST PAIN UNSPECIFIED 11/01/2007  . DIABETES MELLITUS, TYPE II 07/29/2007  . DEPRESSION 07/29/2007  . Essential hypertension 07/29/2007    Past Surgical History:  Procedure Laterality Date  . carbuncle     back of head  . CESAREAN SECTION    . COLONOSCOPY    . KNEE SURGERY     quad  . QUADRICEPS REPAIR    . TOTAL KNEE ARTHROPLASTY Left 08/13/2014   Procedure: TOTAL KNEE ARTHROPLASTY;  Goda: Meredith Pel, MD;  Location: [facility];  Service: Orthopedics;  Laterality: Left;    OB History    Gravida Para Term Preterm AB Living   8 4 4   4 4    SAB TAB Ectopic Multiple Live Births     4     4       Home Medications    Prior to Admission medications   Medication Sig Start Date End Date Taking? Authorizing Provider  acetaminophen (TYLENOL) 500 MG tablet Take 500-1,000 mg by mouth every 6 (six) hours as needed (for pain).   Yes [provider]  acetaminophen-codeine (TYLENOL #3) 300-30 MG tablet Take 1 tablet by mouth every 8 (eight) hours as needed for moderate pain. Patient taking differently: Take 1 tablet by  mouth every 6 (six) hours as needed for moderate pain.  08/11/17  Yes Meredith Pel, MD  doxycycline (VIBRAMYCIN) 100 MG capsule Take 1 capsule (100 mg total) by mouth 2 (two) times daily. Patient taking differently: Take 100 mg by mouth 2 (two) times daily. FOR 10 DAYS 12/04/17  Yes Horton, Barbette Hair, MD  exenatide (BYETTA 10 MCG PEN) 10 MCG/0.04ML SOPN injection Inject 10 mcg into the skin 2 (two) times daily with a meal.   Yes [provider]  HYDROcodone-acetaminophen (NORCO/VICODIN) 5-325 MG tablet Take 1-2 tablets by mouth every 6 (six) hours as needed. Patient taking differently: Take 1-2 tablets by mouth every 6 (six) hours as needed for moderate pain.  12/04/17  Yes  Horton, Barbette Hair, MD  ibuprofen (ADVIL,MOTRIN) 200 MG tablet Take 200-400 mg by mouth every 6 (six) hours as needed (for pain).   Yes [provider]  lisinopril-hydrochlorothiazide (PRINZIDE,ZESTORETIC) 20-12.5 MG tablet Take 1 tablet by mouth daily. 11/28/17  Yes [provider]  Omega-3 Fatty Acids (FISH OIL PO) Take 1 capsule by mouth 3 (three) times a week.    Yes [provider]  Prenatal Vit w/Fe-Methylfol-FA (PNV PO) Take 1 tablet by mouth daily.   Yes [provider]  VOLTAREN 1 % GEL Apply 2-4 g topically See admin instructions. Three to four times a day to affected sites as needed for pain 11/11/17  Yes [provider]  acetaminophen-codeine (TYLENOL #3) 300-30 MG per tablet Take 1-2 tablets by mouth every 6 (six) hours as needed. Patient not taking: Reported on 12/05/2017 08/25/15   Luvenia Redden, PA-C  benzonatate (TESSALON) 100 MG capsule Take 1 capsule (100 mg total) by mouth every 8 (eight) hours. Patient not taking: Reported on 12/05/2017 01/23/17   Mercy Riding, MD  megestrol (MEGACE) 40 MG tablet Take 1 tablet (40 mg total) by mouth 2 (two) times daily. Patient not taking: Reported on 12/05/2017 08/25/15   Luvenia Redden, PA-C  methocarbamol (ROBAXIN) 500 MG tablet Take 1 tablet (500 mg total) by mouth at bedtime as needed for muscle spasms. Patient not taking: Reported on 12/05/2017 08/21/17   Emeline General, PA-C  oseltamivir (TAMIFLU) 75 MG capsule Take 1 capsule (75 mg total) by mouth 2 (two) times daily. Patient not taking: Reported on 12/05/2017 01/23/17   Mercy Riding, MD    Family History Family History  Problem Relation Age of Onset  . Diabetes Mother   . Hyperlipidemia Mother   . Hypertension Mother   . Diabetes Father   . Hyperlipidemia Father   . Hypertension Father   . Cancer Brother   . Heart disease Brother   . Hypertension Brother   . Heart attack Brother     Social History Social History    Tobacco Use  . Smoking status: Former Smoker    Years: 0.00    Types: Cigarettes    Last attempt to quit: 12/13/1978    Years since quitting: 39.0  . Smokeless tobacco: Never Used  Substance Use Topics  . Alcohol use: No    Alcohol/week: 0.0 oz  . Drug use: No     Allergies   Iodine; Shellfish allergy; Aleve [naproxen sodium]; Iohexol; and Percocet [oxycodone-acetaminophen]   Review of Systems Review of Systems  All other systems reviewed and are negative.    Physical Exam Updated Vital Signs BP 118/70   Pulse 70   Temp 98.9 F (37.2 C) (Oral)   Resp 20  Ht 5\' 4"  (1.626 m)   Wt 103.9 kg (229 lb)   LMP 08/22/2015   SpO2 100%   BMI 39.31 kg/m   Physical Exam  Constitutional: She is oriented to person, place, and time. She appears well-developed.  HENT:  Head: Normocephalic and atraumatic.  Eyes: EOM are normal.  Neck: Normal range of motion. Neck supple.  Cardiovascular: Normal rate and intact distal pulses.  Pulmonary/Chest: Effort normal.  Abdominal: Bowel sounds are normal.  Musculoskeletal: She exhibits edema and tenderness.  Grossly edematous left lower extremity with pitting.  Patient also has calor and rubor over the leg. Tenderness to palpation.  Compartments are soft.  Neurological: She is alert and oriented to person, place, and time.  Patient able to discriminate between sharp and dull over her left foot  Skin: Skin is warm and dry. Rash noted.  Nursing note and vitals reviewed.    ED Treatments / Results  Labs (all labs ordered are listed, but only abnormal results are displayed) Labs Reviewed  CBC WITH DIFFERENTIAL/PLATELET - Abnormal; Notable for the following components:      Result Value   Hemoglobin 10.7 (*)    HCT 32.6 (*)    Neutro Abs 8.1 (*)    All other components within normal limits  COMPREHENSIVE METABOLIC PANEL - Abnormal; Notable for the following components:   Sodium 129 (*)    Potassium 3.3 (*)    Chloride 95 (*)     Glucose, Bld 224 (*)    Creatinine, Ser 1.09 (*)    Calcium 8.2 (*)    Albumin 3.0 (*)    GFR calc non Af Amer 56 (*)    All other components within normal limits  CULTURE, BLOOD (ROUTINE X 2)  CULTURE, BLOOD (ROUTINE X 2)  HIV ANTIBODY (ROUTINE TESTING)  BASIC METABOLIC PANEL  CBC  CBC  CREATININE, SERUM    EKG  EKG Interpretation None       Radiology Dg Tibia/fibula Left  Result Date: 12/04/2017 CLINICAL DATA:  56 year old female with left lower extremity redness and swelling. EXAM: LEFT TIBIA AND FIBULA - 2 VIEW COMPARISON:  None. FINDINGS: There is a total left knee arthroplasty which appears intact and in anatomic alignment. There is no acute fracture or dislocation. A small suprapatellar effusion may be present. There is degenerative changes of the tarsal bones. Mild diffuse subcutaneous edema may represent cellulitis. Clinical correlation is recommended. No soft tissue air. IMPRESSION: 1. No acute fracture or dislocation. 2. Left knee arthroplasty appears intact. 3. Mild diffuse subcutaneous edema, likely cellulitis. Clinical correlation is recommended. No soft tissue air. Electronically Signed   By: Anner Crete M.D.   On: 12/04/2017 03:37    Procedures Procedures (including critical care time)  CRITICAL CARE Performed by: Yani Coventry   Total critical care time: 32 minutes  Critical care time was exclusive of separately billable procedures and treating other patients.  Critical care was necessary to treat or prevent imminent or life-threatening deterioration.  Critical care was time spent personally by me on the following activities: development of treatment plan with patient and/or surrogate as well as nursing, discussions with consultants, evaluation of patient's response to treatment, examination of patient, obtaining history from patient or surrogate, ordering and performing treatments and interventions, ordering and review of laboratory studies,  ordering and review of radiographic studies, pulse oximetry and re-evaluation of patient's condition.   Medications Ordered in ED Medications  cefTRIAXone (ROCEPHIN) 2 g in dextrose 5 % 50 mL IVPB (0  g Intravenous Stopped 12/06/17 0111)  exenatide (BYETTA) immediate release injection (not administered)  HYDROcodone-acetaminophen (NORCO/VICODIN) 5-325 MG per tablet 1-2 tablet (not administered)  diclofenac sodium (VOLTAREN) 1 % transdermal gel 2-4 g (not administered)  acetaminophen (TYLENOL) tablet 650 mg (not administered)    Or  acetaminophen (TYLENOL) suppository 650 mg (not administered)  ondansetron (ZOFRAN) tablet 4 mg (not administered)    Or  ondansetron (ZOFRAN) injection 4 mg (not administered)  insulin aspart (novoLOG) injection 0-9 Units (not administered)  enoxaparin (LOVENOX) injection 40 mg (not administered)  0.9 %  sodium chloride infusion (not administered)  acetaminophen (TYLENOL) tablet 650 mg (650 mg Oral Given 12/05/17 1931)     Initial Impression / Assessment and Plan / ED Course  I have reviewed the triage vital signs and the nursing notes.  Pertinent labs & imaging results that were available during my care of the patient were reviewed by me and considered in my medical decision making (see chart for details).     Patient comes in with chief complaint of left leg swelling. It is noted that patient actually had positive blood cultures from her last visit. Susceptibilities to the Streptococcus noted and patient started on ceftriaxone. Patient does not meets 0 SIRS criteria -we will get blood cultures repeated and start ceftriaxone.  Admit to medicine.  Fortunately, exam does not reveal any signs of compartment syndrome.  Final Clinical Impressions(s) / ED Diagnoses   Final diagnoses:  Bacteremia  Cellulitis of left lower extremity    ED Discharge Orders    None       Varney Biles, MD 12/06/17 0145

## 2017-12-06 NOTE — Progress Notes (Signed)
ED report received at 0115 and pt arrived to unit at 0145. Pt A&O x4; MAE x4 except cellulitis noted to LLE.LLE noted to be red and swollen; Leg is marked and VSS. Pt oriented to the unit room; fall/safety precaution and prevention education completed with pt and family at bedside; all voices understanding and denies any questions. Pt in bed with call light within reach and family at bedside. Will closely monitor. Delia Heady RN

## 2017-12-07 ENCOUNTER — Other Ambulatory Visit: Payer: Self-pay

## 2017-12-07 ENCOUNTER — Inpatient Hospital Stay (HOSPITAL_COMMUNITY): Payer: Medicaid Other

## 2017-12-07 DIAGNOSIS — I361 Nonrheumatic tricuspid (valve) insufficiency: Secondary | ICD-10-CM

## 2017-12-07 DIAGNOSIS — L03116 Cellulitis of left lower limb: Secondary | ICD-10-CM

## 2017-12-07 DIAGNOSIS — E1165 Type 2 diabetes mellitus with hyperglycemia: Secondary | ICD-10-CM

## 2017-12-07 DIAGNOSIS — E871 Hypo-osmolality and hyponatremia: Secondary | ICD-10-CM

## 2017-12-07 DIAGNOSIS — B955 Unspecified streptococcus as the cause of diseases classified elsewhere: Secondary | ICD-10-CM

## 2017-12-07 DIAGNOSIS — R7881 Bacteremia: Principal | ICD-10-CM

## 2017-12-07 DIAGNOSIS — I10 Essential (primary) hypertension: Secondary | ICD-10-CM

## 2017-12-07 LAB — BASIC METABOLIC PANEL
ANION GAP: 10 (ref 5–15)
BUN: 20 mg/dL (ref 6–20)
CO2: 25 mmol/L (ref 22–32)
Calcium: 8.2 mg/dL — ABNORMAL LOW (ref 8.9–10.3)
Chloride: 99 mmol/L — ABNORMAL LOW (ref 101–111)
Creatinine, Ser: 0.96 mg/dL (ref 0.44–1.00)
Glucose, Bld: 180 mg/dL — ABNORMAL HIGH (ref 65–99)
POTASSIUM: 3.3 mmol/L — AB (ref 3.5–5.1)
SODIUM: 134 mmol/L — AB (ref 135–145)

## 2017-12-07 LAB — CBC
HCT: 29.9 % — ABNORMAL LOW (ref 36.0–46.0)
HEMOGLOBIN: 9.7 g/dL — AB (ref 12.0–15.0)
MCH: 26.7 pg (ref 26.0–34.0)
MCHC: 32.4 g/dL (ref 30.0–36.0)
MCV: 82.4 fL (ref 78.0–100.0)
PLATELETS: 253 10*3/uL (ref 150–400)
RBC: 3.63 MIL/uL — AB (ref 3.87–5.11)
RDW: 14.4 % (ref 11.5–15.5)
WBC: 7.5 10*3/uL (ref 4.0–10.5)

## 2017-12-07 LAB — GLUCOSE, CAPILLARY
GLUCOSE-CAPILLARY: 104 mg/dL — AB (ref 65–99)
GLUCOSE-CAPILLARY: 131 mg/dL — AB (ref 65–99)
GLUCOSE-CAPILLARY: 164 mg/dL — AB (ref 65–99)
Glucose-Capillary: 156 mg/dL — ABNORMAL HIGH (ref 65–99)
Glucose-Capillary: 163 mg/dL — ABNORMAL HIGH (ref 65–99)
Glucose-Capillary: 146 mg/dL — ABNORMAL HIGH (ref 65–99)

## 2017-12-07 LAB — ECHOCARDIOGRAM COMPLETE
Height: 64 in
Weight: 3664 oz

## 2017-12-07 LAB — MAGNESIUM: MAGNESIUM: 2 mg/dL (ref 1.7–2.4)

## 2017-12-07 MED ORDER — HYDROCHLOROTHIAZIDE 12.5 MG PO CAPS
12.5000 mg | ORAL_CAPSULE | Freq: Every day | ORAL | Status: DC
  Administered 2017-12-07 – 2017-12-08 (×2): 12.5 mg via ORAL
  Filled 2017-12-07 (×2): qty 1

## 2017-12-07 MED ORDER — INSULIN ASPART 100 UNIT/ML ~~LOC~~ SOLN: 0.0000 [IU] | Freq: Every day | SUBCUTANEOUS | Status: DC

## 2017-12-07 MED ORDER — LISINOPRIL-HYDROCHLOROTHIAZIDE 20-12.5 MG PO TABS: 1.0000 | ORAL_TABLET | Freq: Every day | ORAL | Status: DC

## 2017-12-07 MED ORDER — LISINOPRIL 20 MG PO TABS
20.0000 mg | ORAL_TABLET | Freq: Every day | ORAL | Status: DC
  Administered 2017-12-07 – 2017-12-08 (×2): 20 mg via ORAL
  Filled 2017-12-07 (×2): qty 1

## 2017-12-07 MED ORDER — INSULIN ASPART 100 UNIT/ML ~~LOC~~ SOLN: 4.0000 [IU] | Freq: Three times a day (TID) | SUBCUTANEOUS | Status: DC

## 2017-12-07 MED ORDER — INSULIN ASPART 100 UNIT/ML ~~LOC~~ SOLN
0.0000 [IU] | Freq: Three times a day (TID) | SUBCUTANEOUS | Status: DC
Start: 2017-12-07 — End: 2017-12-08

## 2017-12-07 MED ORDER — WHITE PETROLATUM EX OINT
TOPICAL_OINTMENT | CUTANEOUS | Status: AC
  Administered 2017-12-07: 1
  Filled 2017-12-07: qty 28.35

## 2017-12-07 NOTE — Progress Notes (Signed)
Dr. Rockne Menghini to see patient.  Called in to room to discuss insulin schedule.  Per Dr. Rockne Menghini, no need to treat unless over 180 or per patients request.  Patient to call when her blood sugar needs to be checked.

## 2017-12-07 NOTE — Progress Notes (Signed)
Progress Note    Kaitlyn Castillo  EPP:[id number] DOB: [birth year]  DOA: 12/05/2017 PCP: Benito Mccreedy, MD    Brief Narrative:   Chief complaint: F/U cellulitis  Medical records reviewed and are as summarized below:  Kaitlyn Castillo is an 56 y.o. female the PMH of diabetes mellitus, hypertension, hyperlipidemia and morbid obesity who was admitted 12/05/17 for treatment of left lower extremity cellulitis complicated by Streptococcus bacteremia.  Assessment/Plan:   Principal Problem:   Streptococcal bacteremia/left leg cellulitis Patient initially presented to the ED 12/01/17 with fever. But cultures were obtained at that time and the patient was discharged home on oral antibiotics. Blood cultures returned positive for strep and the patient was instructed to return to the ER for further treatment. She was admitted and started on IV Rocephin. 2-D echocardiogram pending. Spoke with Dr. Megan Salon of ID who did not feel an echocardiogram was necessary given bacteremia in the setting of cellulitis. Recommended switch to oral therapy when evidence of cellulitis shows improvement, and discharged home on 7-10 days of oral therapy. Left lower extremity remains intensely erythemic/swollen but is beginning to regress from inked margins. Would continue IV antibiotics for another 24 hours.  Active Problems:   Essential hypertension Antihypertensives on hold secondary soft BP. Okay to resume lisinopril/HCTZ today. Renal function stable.    Uncontrolled type 2 diabetes mellitus with hyperglycemia (HCC) Hemoglobin A1c 7.8%. Currently being managed with [place] and moderate scale SSI every 4 hours. CBGs 104-241. Change SSI to QAC/HS. Patient has voiced many concerns about her insulin regimen and wishes to self determine her management. I've instructed the nurses to allow her to request when she wants her blood sugars checked and only give her SSI when her sugars are greater than 180.   Hyponatremia Mild. Monitor.    Morbid obesity Body mass index is 39.31 kg/m.   Family Communication/Anticipated D/C date and plan/Code Status   DVT prophylaxis: Lovenox ordered. Code Status: Full Code.  Family Communication: Daughter at the bedside. Disposition Plan: Hopefully will be stable for discharge 12/08/17.   Medical Consultants:    None.   Anti-Infectives:    Rocephin 12/07/17--->  Subjective:   Patient is irritable and upset that her medical treatment was not fully explained to her on admission. She is also upset that I changed her antibiotics without first discussing the plan of care with her. Spent 30 minutes explaining her plan of care with her to her satisfaction. Feels that her left leg is beginning to show signs of improvement but she is still exquisitely tender along her tibia. No fevers.  Objective:    Vitals:   12/06/17 0623 12/06/17 1700 12/06/17 2137 12/07/17 0621  BP: 127/61 (!) 93/56 (!) 101/53 121/62  Pulse: 78 75 68 63  Resp: 19 18 16 17   Temp: 98.3 F (36.8 C) 98.7 F (37.1 C) 98 F (36.7 C) 98.9 F (37.2 C)  TempSrc: Oral Oral Oral Oral  SpO2: 100% 100% 100% 100%  Weight:      Height:        Intake/Output Summary (Last 24 hours) at 12/07/2017 0832 Last data filed at 12/06/2017 1823 Gross per 24 hour  Intake 2167.42 ml  Output -  Net 2167.42 ml   Filed Weights   12/05/17 1738  Weight: 103.9 kg (229 lb)    Exam: General: No acute distress. Cardiovascular: Heart sounds show a regular rate, and rhythm. No gallops or rubs. No murmurs. No JVD. Lungs: Clear to auscultation bilaterally with good air  movement. No rales, rhonchi or wheezes. Abdomen: Soft, nontender, nondistended with normal active bowel sounds. No masses. No hepatosplenomegaly. Neurological: Alert and oriented 3. Moves all extremities 4 with equal strength. Cranial nerves II through XII grossly intact. Skin: Warm and dry. Left lower leg swollen and  erythematous. Extremities: Left leg as described above, swollen and erythematous. Psychiatric: Mood and affect are mildly irritable. Insight and judgment are fair.   Data Reviewed:   I have personally reviewed following labs and imaging studies:  Labs: Labs show the following:   Basic Metabolic Panel: Recent Labs  Lab 12/01/17 1454 12/04/17 0523 12/05/17 1756 12/06/17 0606 12/06/17 0615  NA 134* 131* 129* 132*  --   K 3.6 3.6 3.3* 3.3*  --   CL 101 98* 95* 95*  --   CO2 26 24 26 28   --   GLUCOSE 152* 184* 224* 217*  --   BUN 20 25* 20 19  --   CREATININE 1.03* 1.10* 1.09* 0.97 1.01*  CALCIUM 9.1 8.4* 8.2* 8.5*  --    GFR Estimated Creatinine Clearance: 73 mL/min (A) (by C-G formula based on SCr of 1.01 mg/dL (H)). Liver Function Tests: Recent Labs  Lab 12/01/17 1454 12/05/17 1756  AST 31 27  ALT 29 34  ALKPHOS 41 47  BILITOT 0.8 0.8  PROT 7.2 6.8  ALBUMIN 3.8 3.0*   CBC: Recent Labs  Lab 12/01/17 1454 12/04/17 0523 12/05/17 1756 12/06/17 0615  WBC 6.2 6.7 10.2 9.8  NEUTROABS 5.1 5.5 8.1*  --   HGB 11.7* 11.9* 10.7* 10.8*  HCT 35.1* 35.1* 32.6* 32.5*  MCV 82.4 82.4 81.7 81.7  PLT 231 185 227 261   Cardiac Enzymes: Recent Labs  Lab 12/01/17 1501 12/01/17 2115  CKTOTAL 388* 308*   CBG: Recent Labs  Lab 12/06/17 1257 12/06/17 1628 12/06/17 2135 12/07/17 0213 12/07/17 0619  GLUCAP 241* 159* 223* 104* 131*   Hgb A1c: Recent Labs    12/06/17 2044  HGBA1C 7.8*   Lipid Profile: Recent Labs    12/06/17 2044  CHOL 130  HDL 34*  LDLCALC 83  TRIG 63  CHOLHDL 3.8   Sepsis Labs: Recent Labs  Lab 12/01/17 1454 12/01/17 1523 12/04/17 0523 12/05/17 1756 12/06/17 0615  WBC 6.2  --  6.7 10.2 9.8  LATICACIDVEN  --  0.96  --   --   --     Microbiology Recent Results (from the past 240 hour(s))  Urine culture     Status: Abnormal   Collection Time: 12/01/17  3:15 AM  Result Value Ref Range Status   Specimen Description URINE, RANDOM   Final   Special Requests NONE  Final   Culture <10,000 COLONIES/mL INSIGNIFICANT GROWTH (A)  Final   Report Status 12/02/2017 FINAL  Final  Blood culture (routine x 2)     Status: None   Collection Time: 12/01/17  2:54 PM  Result Value Ref Range Status   Specimen Description BLOOD RIGHT ANTECUBITAL  Final   Special Requests   Final    BOTTLES DRAWN AEROBIC AND ANAEROBIC Blood Culture adequate volume   Culture NO GROWTH 5 DAYS  Final   Report Status 12/06/2017 FINAL  Final  Blood culture (routine x 2)     Status: Abnormal   Collection Time: 12/01/17  3:02 PM  Result Value Ref Range Status   Specimen Description BLOOD LEFT ANTECUBITAL  Final   Special Requests   Final    BOTTLES DRAWN AEROBIC AND ANAEROBIC Blood Culture  adequate volume   Culture  Setup Time   Final    GRAM POSITIVE COCCI IN CHAINS IN BOTH AEROBIC AND ANAEROBIC BOTTLES CRITICAL RESULT CALLED TO, READ BACK BY AND VERIFIED WITH: K NEAL RN 0500 12/02/17 A BROWNING    Culture STREPTOCOCCUS GROUP G (A)  Final   Report Status 12/04/2017 FINAL  Final   Organism ID, Bacteria STREPTOCOCCUS GROUP G  Final      Susceptibility   Streptococcus group g - MIC*    CLINDAMYCIN <=0.25 SENSITIVE Sensitive     AMPICILLIN <=0.25 SENSITIVE Sensitive     ERYTHROMYCIN <=0.12 SENSITIVE Sensitive     VANCOMYCIN 0.25 SENSITIVE Sensitive     CEFTRIAXONE <=0.12 SENSITIVE Sensitive     LEVOFLOXACIN 0.5 SENSITIVE Sensitive     * STREPTOCOCCUS GROUP G  Blood Culture ID Panel (Reflexed)     Status: Abnormal   Collection Time: 12/01/17  3:02 PM  Result Value Ref Range Status   Enterococcus species NOT DETECTED NOT DETECTED Final   Listeria monocytogenes NOT DETECTED NOT DETECTED Final   Staphylococcus species NOT DETECTED NOT DETECTED Final   Staphylococcus aureus NOT DETECTED NOT DETECTED Final   Streptococcus species DETECTED (A) NOT DETECTED Final    Comment: Not Enterococcus species, Streptococcus agalactiae, Streptococcus pyogenes, or  Streptococcus pneumoniae. CRITICAL RESULT CALLED TO, READ BACK BY AND VERIFIED WITH: K NEAL RN 0500 12/02/17 A BROWNING    Streptococcus agalactiae NOT DETECTED NOT DETECTED Final   Streptococcus pneumoniae NOT DETECTED NOT DETECTED Final   Streptococcus pyogenes NOT DETECTED NOT DETECTED Final   Acinetobacter baumannii NOT DETECTED NOT DETECTED Final   Enterobacteriaceae species NOT DETECTED NOT DETECTED Final   Enterobacter cloacae complex NOT DETECTED NOT DETECTED Final   Escherichia coli NOT DETECTED NOT DETECTED Final   Klebsiella oxytoca NOT DETECTED NOT DETECTED Final   Klebsiella pneumoniae NOT DETECTED NOT DETECTED Final   Proteus species NOT DETECTED NOT DETECTED Final   Serratia marcescens NOT DETECTED NOT DETECTED Final   Haemophilus influenzae NOT DETECTED NOT DETECTED Final   Neisseria meningitidis NOT DETECTED NOT DETECTED Final   Pseudomonas aeruginosa NOT DETECTED NOT DETECTED Final   Candida albicans NOT DETECTED NOT DETECTED Final   Candida glabrata NOT DETECTED NOT DETECTED Final   Candida krusei NOT DETECTED NOT DETECTED Final   Candida parapsilosis NOT DETECTED NOT DETECTED Final   Candida tropicalis NOT DETECTED NOT DETECTED Final    Procedures and diagnostic studies:  No results found.  Medications:   . enoxaparin (LOVENOX) injection  40 mg Subcutaneous Daily  . exenatide  10 mcg Subcutaneous BID WC  . insulin aspart  0-15 Units Subcutaneous Q4H   Continuous Infusions: . cefTRIAXone (ROCEPHIN)  IV Stopped (12/06/17 2223)     LOS: 1 day    Greater than 35 minutes spent with the patient today discussing her test results and plan of care. Greater than 50% of the time spent face-to-face with the patient.  Margreta Journey Jala Dundon  [facility] Pager [phone]. If unable to reach me by pager, please call my cell phone at [phone].  *Please refer to [email or web address], password [email or web address] to get updated schedule on who will round on this patient, as  hospitalists switch teams weekly. If 7PM-7AM, please contact night-coverage at [email or web address], password [email or web address] for any overnight needs.  12/07/2017, 8:32 AM

## 2017-12-07 NOTE — Progress Notes (Signed)
  Echocardiogram 2D Echocardiogram has been performed.  Kaitlyn Castillo 12/07/2017, 8:57 AM

## 2017-12-07 NOTE — Progress Notes (Signed)
RN answered Pt's call about wanting to get her test results from the Echo earlier. RN call [facility] and Baltazar Najjar said that it was okay for RN to tell the pt that for the most part the results were normal but there were some inconsistencies that the MD would discuss with her tomorrow. Pt was fine with that answer and RN told her she would pass it on to the day shift nurse in order to get the MD to see the patient about results.

## 2017-12-08 ENCOUNTER — Encounter (HOSPITAL_COMMUNITY): Payer: Self-pay | Admitting: Internal Medicine

## 2017-12-08 DIAGNOSIS — E669 Obesity, unspecified: Secondary | ICD-10-CM

## 2017-12-08 HISTORY — DX: Obesity, unspecified: E66.9

## 2017-12-08 LAB — GLUCOSE, CAPILLARY: Glucose-Capillary: 185 mg/dL — ABNORMAL HIGH (ref 65–99)

## 2017-12-08 MED ORDER — HYDROCODONE-ACETAMINOPHEN 5-325 MG PO TABS: 1.0000 | ORAL_TABLET | Freq: Four times a day (QID) | ORAL | 0 refills | Status: DC | PRN

## 2017-12-08 MED ORDER — AMOXICILLIN-POT CLAVULANATE 875-125 MG PO TABS: 1.0000 | ORAL_TABLET | Freq: Two times a day (BID) | ORAL | 0 refills | Status: AC

## 2017-12-08 NOTE — Discharge Summary (Signed)
Physician Discharge Summary  Kaitlyn Castillo LOV:[id number] DOB: 23-Jul-1961 DOA: 12/05/2017  PCP: Benito Mccreedy, MD  Admit date: 12/05/2017 Discharge date: 12/08/2017  Admitted From: Home Discharge disposition: Home   Recommendations for Outpatient Follow-Up:   1. F/U with PCP for non-resolution of symptoms. 2. F/U surveillance cultures.   Discharge Diagnosis:   Principal Problem:   Streptococcal bacteremia Active Problems:   Cellulitis of the left leg   Essential hypertension   Uncontrolled type 2 diabetes mellitus with hyperglycemia (HCC)   Hyponatremia   Morbid obesity  Discharge Condition: Improved.  Diet recommendation: Low sodium, heart healthy.  Carbohydrate-modified.    Code Status: Full.  History of Present Illness:   Kaitlyn Castillo is an 56 y.o. female the PMH of diabetes mellitus, hypertension, hyperlipidemia and morbid obesity who was admitted 12/05/17 for treatment of left lower extremity cellulitis complicated by Streptococcus bacteremia.  Hospital Course by Problem:   Principal Problem:   Streptococcal bacteremia/left leg cellulitis Patient initially presented to the ED 12/01/17 with fever. But cultures were obtained at that time and the patient was discharged home on oral antibiotics. Blood cultures returned positive for strep and the patient was instructed to return to the ER for further treatment. She was admitted and started on IV Rocephin. 2-D echocardiogram negative for vegetations. Spoke with Dr. Megan Salon of ID who did not feel an echocardiogram was necessary given bacteremia in the setting of cellulitis. Recommended switch to oral therapy when evidence of cellulitis shows improvement, and discharged home on 7 days of oral therapy with Augmentin. Cellulitis improving at discharge.  Active Problems:   Essential hypertension Continue lisinopril/HCTZ. Renal function stable.    Uncontrolled type 2 diabetes mellitus with hyperglycemia  (HCC) Hemoglobin A1c 7.8%. Resume pre-hospitalization regimen.    Hyponatremia Mild.     Morbid obesity Body mass index is 39.31 kg/m.  Medical Consultants:    None.   Discharge Exam:   Vitals:   12/07/17 2300 12/08/17 0549  BP: (!) 148/58 140/60  Pulse: 84 78  Resp: 18 16  Temp: 99.5 F (37.5 C) 98.4 F (36.9 C)  SpO2: 100% 100%   Vitals:   12/07/17 0621 12/07/17 1700 12/07/17 2300 12/08/17 0549  BP: 121/62 128/65 (!) 148/58 140/60  Pulse: 63 69 84 78  Resp: 17 16 18 16   Temp: 98.9 F (37.2 C) 99.1 F (37.3 C) 99.5 F (37.5 C) 98.4 F (36.9 C)  TempSrc: Oral Oral Oral Oral  SpO2: 100% 100% 100% 100%  Weight:      Height:        General exam: Appears calm and comfortable.  Respiratory system: Clear to auscultation. Respiratory effort normal. Cardiovascular system: S1 & S2 heard, RRR. No JVD,  rubs, gallops or clicks. No murmurs. Gastrointestinal system: Abdomen is nondistended, soft and nontender. No organomegaly or masses felt. Normal bowel sounds heard. Central nervous system: Alert and oriented. No focal neurological deficits. Extremities: No clubbing,  or cyanosis. LLE erythema and swelling improved. Skin: LLE cellulitis. Psychiatry: Judgement and insight appear normal. Mood & affect appropriate.    The results of significant diagnostics from this hospitalization (including imaging, microbiology, ancillary and laboratory) are listed below for reference.     Procedures and Diagnostic Studies:   No results found.   Labs:   Basic Metabolic Panel: Recent Labs  Lab 12/01/17 1454 12/04/17 0523 12/05/17 1756 12/06/17 0606 12/06/17 0615 12/07/17 0750  NA 134* 131* 129* 132*  --  134*  K 3.6 3.6 3.3* 3.3*  --  3.3*  CL 101 98* 95* 95*  --  99*  CO2 26 24 26 28   --  25  GLUCOSE 152* 184* 224* 217*  --  180*  BUN 20 25* 20 19  --  20  CREATININE 1.03* 1.10* 1.09* 0.97 1.01* 0.96  CALCIUM 9.1 8.4* 8.2* 8.5*  --  8.2*  MG  --   --   --   --    --  2.0   GFR Estimated Creatinine Clearance: 76.9 mL/min (by C-G formula based on SCr of 0.96 mg/dL). Liver Function Tests: Recent Labs  Lab 12/01/17 1454 12/05/17 1756  AST 31 27  ALT 29 34  ALKPHOS 41 47  BILITOT 0.8 0.8  PROT 7.2 6.8  ALBUMIN 3.8 3.0*    CBC: Recent Labs  Lab 12/01/17 1454 12/04/17 0523 12/05/17 1756 12/06/17 0615 12/07/17 0750  WBC 6.2 6.7 10.2 9.8 7.5  NEUTROABS 5.1 5.5 8.1*  --   --   HGB 11.7* 11.9* 10.7* 10.8* 9.7*  HCT 35.1* 35.1* 32.6* 32.5* 29.9*  MCV 82.4 82.4 81.7 81.7 82.4  PLT 231 185 227 261 253   Cardiac Enzymes: Recent Labs  Lab 12/01/17 1501 12/01/17 2115  CKTOTAL 388* 308*   CBG: Recent Labs  Lab 12/07/17 0903 12/07/17 1133 12/07/17 1506 12/07/17 2223 12/08/17 1117  GLUCAP 164* 156* 163* 146* 185*  Hgb A1c Recent Labs    12/06/17 2044  HGBA1C 7.8*   Lipid Profile Recent Labs    12/06/17 2044  CHOL 130  HDL 34*  LDLCALC 83  TRIG 63  CHOLHDL 3.8   Microbiology Recent Results (from the past 240 hour(s))  Urine culture     Status: Abnormal   Collection Time: 12/01/17  3:15 AM  Result Value Ref Range Status   Specimen Description URINE, RANDOM  Final   Special Requests NONE  Final   Culture <10,000 COLONIES/mL INSIGNIFICANT GROWTH (A)  Final   Report Status 12/02/2017 FINAL  Final  Blood culture (routine x 2)     Status: None   Collection Time: 12/01/17  2:54 PM  Result Value Ref Range Status   Specimen Description BLOOD RIGHT ANTECUBITAL  Final   Special Requests   Final    BOTTLES DRAWN AEROBIC AND ANAEROBIC Blood Culture adequate volume   Culture NO GROWTH 5 DAYS  Final   Report Status 12/06/2017 FINAL  Final  Blood culture (routine x 2)     Status: Abnormal   Collection Time: 12/01/17  3:02 PM  Result Value Ref Range Status   Specimen Description BLOOD LEFT ANTECUBITAL  Final   Special Requests   Final    BOTTLES DRAWN AEROBIC AND ANAEROBIC Blood Culture adequate volume   Culture  Setup Time    Final    GRAM POSITIVE COCCI IN CHAINS IN BOTH AEROBIC AND ANAEROBIC BOTTLES CRITICAL RESULT CALLED TO, READ BACK BY AND VERIFIED WITH: K NEAL RN 0500 12/02/17 A BROWNING    Culture STREPTOCOCCUS GROUP G (A)  Final   Report Status 12/04/2017 FINAL  Final   Organism ID, Bacteria STREPTOCOCCUS GROUP G  Final      Susceptibility   Streptococcus group g - MIC*    CLINDAMYCIN <=0.25 SENSITIVE Sensitive     AMPICILLIN <=0.25 SENSITIVE Sensitive     ERYTHROMYCIN <=0.12 SENSITIVE Sensitive     VANCOMYCIN 0.25 SENSITIVE Sensitive     CEFTRIAXONE <=0.12 SENSITIVE Sensitive     LEVOFLOXACIN 0.5 SENSITIVE Sensitive     * STREPTOCOCCUS GROUP  G  Blood Culture ID Panel (Reflexed)     Status: Abnormal   Collection Time: 12/01/17  3:02 PM  Result Value Ref Range Status   Enterococcus species NOT DETECTED NOT DETECTED Final   Listeria monocytogenes NOT DETECTED NOT DETECTED Final   Staphylococcus species NOT DETECTED NOT DETECTED Final   Staphylococcus aureus NOT DETECTED NOT DETECTED Final   Streptococcus species DETECTED (A) NOT DETECTED Final    Comment: Not Enterococcus species, Streptococcus agalactiae, Streptococcus pyogenes, or Streptococcus pneumoniae. CRITICAL RESULT CALLED TO, READ BACK BY AND VERIFIED WITH: K NEAL RN 0500 12/02/17 A BROWNING    Streptococcus agalactiae NOT DETECTED NOT DETECTED Final   Streptococcus pneumoniae NOT DETECTED NOT DETECTED Final   Streptococcus pyogenes NOT DETECTED NOT DETECTED Final   Acinetobacter baumannii NOT DETECTED NOT DETECTED Final   Enterobacteriaceae species NOT DETECTED NOT DETECTED Final   Enterobacter cloacae complex NOT DETECTED NOT DETECTED Final   Escherichia coli NOT DETECTED NOT DETECTED Final   Klebsiella oxytoca NOT DETECTED NOT DETECTED Final   Klebsiella pneumoniae NOT DETECTED NOT DETECTED Final   Proteus species NOT DETECTED NOT DETECTED Final   Serratia marcescens NOT DETECTED NOT DETECTED Final   Haemophilus influenzae NOT  DETECTED NOT DETECTED Final   Neisseria meningitidis NOT DETECTED NOT DETECTED Final   Pseudomonas aeruginosa NOT DETECTED NOT DETECTED Final   Candida albicans NOT DETECTED NOT DETECTED Final   Candida glabrata NOT DETECTED NOT DETECTED Final   Candida krusei NOT DETECTED NOT DETECTED Final   Candida parapsilosis NOT DETECTED NOT DETECTED Final   Candida tropicalis NOT DETECTED NOT DETECTED Final  Blood culture (routine x 2)     Status: None (Preliminary result)   Collection Time: 12/06/17 12:11 AM  Result Value Ref Range Status   Specimen Description BLOOD RIGHT ANTECUBITAL  Final   Special Requests   Final    BOTTLES DRAWN AEROBIC AND ANAEROBIC Blood Culture adequate volume   Culture NO GROWTH 1 DAY  Final   Report Status PENDING  Incomplete  Blood culture (routine x 2)     Status: None (Preliminary result)   Collection Time: 12/06/17 12:18 AM  Result Value Ref Range Status   Specimen Description BLOOD LEFT ANTECUBITAL  Final   Special Requests   Final    BOTTLES DRAWN AEROBIC AND ANAEROBIC Blood Culture adequate volume   Culture NO GROWTH 1 DAY  Final   Report Status PENDING  Incomplete     Discharge Instructions:   Discharge Instructions    Call MD for:  extreme fatigue   Complete by:  As directed    Call MD for:  severe uncontrolled pain   Complete by:  As directed    Call MD for:  temperature >100.4   Complete by:  As directed    Diet - low sodium heart healthy   Complete by:  As directed    Diet Carb Modified   Complete by:  As directed    Increase activity slowly   Complete by:  As directed      Allergies as of 12/08/2017      Reactions   Iodine Anaphylaxis, Hives   Certain iodine in fish   Shellfish Allergy Anaphylaxis   Iodine breaks her out and "stops her breathing"   Aleve [naproxen Sodium] Hives   Iohexol Hives   Can possibly tolerate with Benadryl (but ask first??)   Percocet [oxycodone-acetaminophen] Hives, Itching, Other (See Comments)   Causes  paranoia  Medication List    STOP taking these medications   benzonatate 100 MG capsule Commonly known as:  TESSALON   doxycycline 100 MG capsule Commonly known as:  VIBRAMYCIN   megestrol 40 MG tablet Commonly known as:  MEGACE   methocarbamol 500 MG tablet Commonly known as:  ROBAXIN   oseltamivir 75 MG capsule Commonly known as:  TAMIFLU     TAKE these medications   acetaminophen 500 MG tablet Commonly known as:  TYLENOL Take 500-1,000 mg by mouth every 6 (six) hours as needed (for pain).   acetaminophen-codeine 300-30 MG tablet Commonly known as:  TYLENOL #3 Take 1 tablet by mouth every 8 (eight) hours as needed for moderate pain. What changed:    when to take this  Another medication with the same name was removed. Continue taking this medication, and follow the directions you see here.   amoxicillin-clavulanate 875-125 MG tablet Commonly known as:  AUGMENTIN Take 1 tablet by mouth 2 (two) times daily for 7 days.   BYETTA 10 MCG PEN 10 MCG/0.04ML Sopn injection Generic drug:  exenatide Inject 10 mcg into the skin 2 (two) times daily with a meal.   FISH OIL PO Take 1 capsule by mouth 3 (three) times a week.   HYDROcodone-acetaminophen 5-325 MG tablet Commonly known as:  NORCO/VICODIN Take 1-2 tablets by mouth every 6 (six) hours as needed. What changed:  reasons to take this   ibuprofen 200 MG tablet Commonly known as:  ADVIL,MOTRIN Take 200-400 mg by mouth every 6 (six) hours as needed (for pain).   lisinopril-hydrochlorothiazide 20-12.5 MG tablet Commonly known as:  PRINZIDE,ZESTORETIC Take 1 tablet by mouth daily.   PNV PO Take 1 tablet by mouth daily.   VOLTAREN 1 % Gel Generic drug:  diclofenac sodium Apply 2-4 g topically See admin instructions. Three to four times a day to affected sites as needed for pain      Follow-up Information    Osei-Bonsu, Iona Beard, MD. Schedule an appointment as soon as possible for a visit.   Specialty:   Internal Medicine Why:  If redness and swelling do not completely resolve in 1 week. Contact information: [address] [place] [address] [phone]            Time coordinating discharge: 35 minutes.  SignedMargreta Journey Yaphet [place]  Pager [phone] [facility] 12/08/2017, 11:38 AM

## 2017-12-08 NOTE — Plan of Care (Signed)
  Education: Knowledge of General Education information will improve 12/08/2017 1132 - Completed/Met by Ames Dura, RN 12/08/2017 1132 - Progressing by Ames Dura, RN   Health Behavior/Discharge Planning: Ability to manage health-related needs will improve 12/08/2017 1132 - Completed/Met by Ames Dura, RN 12/08/2017 1132 - Progressing by Ames Dura, RN   Clinical Measurements: Ability to maintain clinical measurements within normal limits will improve 12/08/2017 1132 - Completed/Met by Ames Dura, RN 12/08/2017 1132 - Progressing by Ames Dura, RN Will remain free from infection 12/08/2017 1132 - Completed/Met by Ames Dura, RN 12/08/2017 1132 - Progressing by Ames Dura, RN Diagnostic test results will improve 12/08/2017 1132 - Completed/Met by Ames Dura, RN 12/08/2017 1132 - Progressing by Ames Dura, RN Respiratory complications will improve 12/08/2017 1132 - Completed/Met by Ames Dura, RN 12/08/2017 1132 - Progressing by Ames Dura, RN Cardiovascular complication will be avoided 12/08/2017 1132 - Completed/Met by Ames Dura, RN 12/08/2017 1132 - Progressing by Ames Dura, RN   Nutrition: Adequate nutrition will be maintained 12/08/2017 1132 - Completed/Met by Ames Dura, RN 12/08/2017 1132 - Progressing by Ames Dura, RN   Coping: Level of anxiety will decrease 12/08/2017 1132 - Completed/Met by Ames Dura, RN 12/08/2017 1132 - Progressing by Ames Dura, RN   Elimination: Will not experience complications related to bowel motility 12/08/2017 1132 - Completed/Met by Ames Dura, RN 12/08/2017 1132 - Progressing by Ames Dura, RN Will not experience complications related to urinary retention 12/08/2017 1132 - Completed/Met by Ames Dura, RN 12/08/2017 1132 - Progressing by Ames Dura, RN   Pain  Managment: General experience of comfort will improve 12/08/2017 1132 - Completed/Met by Ames Dura, RN 12/08/2017 1132 - Progressing by Ames Dura, RN   Safety: Ability to remain free from injury will improve 12/08/2017 1132 - Completed/Met by Ames Dura, RN 12/08/2017 1132 - Progressing by Ames Dura, RN   Skin Integrity: Risk for impaired skin integrity will decrease 12/08/2017 1132 - Completed/Met by Ames Dura, RN 12/08/2017 1132 - Progressing by Ames Dura, RN   Clinical Measurements: Ability to avoid or minimize complications of infection will improve 12/08/2017 1132 - Completed/Met by Ames Dura, RN 12/08/2017 1132 - Progressing by Ames Dura, RN   Skin Integrity: Skin integrity will improve 12/08/2017 1132 - Completed/Met by Ames Dura, RN 12/08/2017 1132 - Progressing by Ames Dura, RN

## 2017-12-08 NOTE — Discharge Instructions (Signed)
Bacteremia °Bacteremia is the presence of bacteria in the blood. When bacteria enter the bloodstream, they can cause a life-threatening reaction called sepsis, which is a medical emergency. Bacteremia can spread to other parts of the body, including the heart, joints, and brain. °What are the causes? °This condition is caused by bacteria that get into the blood. Bacteria can enter the blood: °· From a skin infection or a cut on your skin. °· During an episode of pneumonia. °· From an infection in your stomach or intestine (gastrointestinal infection). °· From an infection in your bladder or urinary system (urinary tract infection). °· During a dental or medical procedure. °· After you brush your teeth so hard that your gums bleed. °· When a bacterial infection in another part of the body spreads to the blood. °· Through a dirty needle. ° °What increases the risk? °This condition is more likely to develop in: °· Children. °· Elderly adults. °· People who have a long-lasting (chronic) disease or medical condition. °· People who have an artificial joint or heart valve. °· People who have heart valve disease. °· People who have a tube, such as a catheter or IV tube, that has been inserted for a medical treatment. °· People who have a weak body defense system (immune system). °· People who use IV drugs. ° °What are the signs or symptoms? °Symptoms of this condition include: °· Fever. °· Chills. °· A racing heart. °· Shortness of breath. °· Dizziness. °· Weakness. °· Confusion. °· Nausea or vomiting. °· Diarrhea. ° °In some cases, there are no symptoms. Bacteremia that has spread to the other parts of the body may cause symptoms in those areas. °How is this diagnosed? °This condition may be diagnosed with a physical exam and tests, such as: °· A complete blood count (CBC). This test looks for signs of infection. °· Blood cultures. These look for bacteria in your blood. °· Tests of any tubes that you may have inserted into  your body, such as an IV tube or urinary catheter. These tests look for a source of infection. °· Urine tests including urine cultures. These look for bacteria in the urine that could be a source of infection. °· Imaging tests, such as an X-ray, CT scan, MRI, or heart ultrasound. These look for a source of infection in other parts of the body, such as the lungs, heart valves, or joints. ° °How is this treated? °This condition may be treated with: °· Antibiotic medicines given through an IV infusion. Depending on the source of infection, antibiotics may be needed for several weeks. At first, an antibiotic may be given to kill most types of blood bacteria. If your test results show that a certain kind of bacteria is causing problems, the antibiotic may be changed to kill only the bacteria that are causing problems. °· Antibiotics taken by mouth. °· IV fluids to support the body as you fight the infection. °· Removing any catheter or device that could be a source of infection. °· Blood pressure and breathing support, if you have sepsis. °· Surgery to control the source or spread of infection, such as: °? Removing an infected implanted device. °? Removing infected tissue or an abscess. ° °This condition is usually treated at a hospital. If you are treated at home, you may need to come back for medicines, blood tests, and evaluation. This is important. °Follow these instructions at home: °· Take over-the-counter and prescription medicines only as told by your health care provider. °·   If you were prescribed an antibiotic, take it as told by your health care provider. Do not stop taking the antibiotic even if you start to feel better. °· Rest until your condition is under control. °· Drink enough fluid to keep your urine clear or pale yellow. °· Do not smoke. If you need help quitting, ask your health care provider. °· Keep all follow-up visits as told by your health care provider. This is important. °How is this  prevented? °· Get the vaccinations that your health care provider recommends. °· Clean and cover any scrapes or cuts. °· Take good care of your skin. This includes regular bathing and moisturizing. °· Wash your hands often. °· Practice good oral hygiene. Brush your teeth two times a day and floss regularly. °Get help right away if: °· You have pain. °· You have a fever. °· You have trouble breathing. °· Your skin becomes blotchy, pale, or clammy. °· You develop confusion, dizziness, or weakness. °· You develop diarrhea. °· You develop any new symptoms after treatment. °Summary °· Bacteremia is the presence of bacteria in the blood. When bacteria enter the bloodstream, they can cause a life- threatening reaction called sepsis. °· Children and elderly adults are at increased risk of bacteremia. Other risk factors include having a long-lasting (chronic) disease or a weak immune system, having an artificial joint or heart valve, having heart valve disease, having tubes that were inserted in the body for medical treatment, or using IV drugs. °· Some symptoms of bacteremia include fever, chills, shortness of breath, confusion, nausea or vomiting, and diarrhea. °· Tests may be done to diagnose a source of infection that led to bacteremia. These tests may include blood tests, urine tests, and imaging tests. °· Bacteremia is usually treated with antibiotics, usually in a hospital. °This information is not intended to replace advice given to you by your health care provider. Make sure you discuss any questions you have with your health care provider. °Document Released: 09/12/2006 Document Revised: 10/26/2016 Document Reviewed: 10/26/2016 °Elsevier Interactive Patient Education © 2018 [employer] ° °

## 2017-12-08 NOTE — Progress Notes (Signed)
Patient verbally stated she did not know when her ride was coming and that she would call the front desk when her daughter arrived. No call was received by the desk. Patient taken down to lobby with belongings, IV removed, AVS printed and signed by patient and belongings placed in patient's daughters car.

## 2017-12-11 LAB — CULTURE, BLOOD (ROUTINE X 2)
Culture: NO GROWTH
Culture: NO GROWTH
SPECIAL REQUESTS: ADEQUATE
SPECIAL REQUESTS: ADEQUATE

## 2017-12-15 ENCOUNTER — Ambulatory Visit: Payer: Medicaid Other | Admitting: Podiatry

## 2018-01-05 ENCOUNTER — Ambulatory Visit: Payer: Medicaid Other | Admitting: Podiatry

## 2018-01-17 ENCOUNTER — Ambulatory Visit: Payer: Medicaid Other | Admitting: Podiatry

## 2018-01-17 DIAGNOSIS — M79672 Pain in left foot: Secondary | ICD-10-CM | POA: Diagnosis not present

## 2018-01-17 DIAGNOSIS — M79671 Pain in right foot: Secondary | ICD-10-CM

## 2018-01-17 DIAGNOSIS — B351 Tinea unguium: Secondary | ICD-10-CM | POA: Diagnosis not present

## 2018-01-17 NOTE — Progress Notes (Signed)
Subjective: 57 y.o. year old female patient presents complaining of painful nails. Patient requests toe nails trimmed.  Stated that she had sudden onset of elevated fever, 104F while she was in [place]. People sent her home after cooling her down. She noted of a swollen left foot which advanced to septic and hospitalized during Christmas holiday season.  She never found any open wound that could be associated with the Septicemia.   Objective: Dermatologic: Thick yellow deformed nails x 10. Distal clavi 3rd left. Vascular: Dorsalis pedis arteries both palpable. Minimum edema with discoloration of lower limb left foot. Tight leathery skin left lower limb.  Orthopedic: Contracted 3rd digit bilateral. Neurologic: All epicritic and tactile sensations grossly intact.  Assessment: Dystrophic mycotic nails x 10. S/P Acute cellulitis left lower limb 5 weeks ago.  May need to R/O Insect bite and cellular reaction.  Treatment: All mycotic nails, corns, calluses debrided.  Return in 3 months or as needed.

## 2018-01-17 NOTE — Patient Instructions (Signed)
Seen for hypertrophic nails and swollen left lower limb. All nails debrided. R/O Possible Cellular reaction from insect bite on left lower limb. Continue with light exercise. Return in 3 months or sooner if needed.

## 2018-01-18 ENCOUNTER — Encounter: Payer: Self-pay | Admitting: Podiatry

## 2018-04-17 ENCOUNTER — Ambulatory Visit: Payer: Medicaid Other | Admitting: Podiatry

## 2018-05-26 ENCOUNTER — Telehealth: Payer: Self-pay

## 2018-05-26 NOTE — Telephone Encounter (Signed)
Pt called expressing concern about wound on toe. Wanted to be seen today. Advised pt that the provider is in surgery today. Advised pt to go to urgent care or ED to be evaluated. Pt stated that she will go to the ED today.

## 2018-05-27 ENCOUNTER — Encounter (HOSPITAL_COMMUNITY): Payer: Self-pay | Admitting: *Deleted

## 2018-05-27 ENCOUNTER — Ambulatory Visit (HOSPITAL_COMMUNITY)
Admission: EM | Admit: 2018-05-27 | Discharge: 2018-05-27 | Disposition: A | Discharge status: Home / Self Care | Payer: Medicaid Other | Attending: Family Medicine | Admitting: Family Medicine

## 2018-05-27 DIAGNOSIS — M79675 Pain in left toe(s): Secondary | ICD-10-CM

## 2018-05-27 DIAGNOSIS — B351 Tinea unguium: Secondary | ICD-10-CM

## 2018-05-27 DIAGNOSIS — L84 Corns and callosities: Secondary | ICD-10-CM

## 2018-05-27 DIAGNOSIS — B353 Tinea pedis: Secondary | ICD-10-CM

## 2018-05-27 HISTORY — DX: Charcot's joint, unspecified ankle and foot: M14.679

## 2018-05-27 MED ORDER — SULFAMETHOXAZOLE-TRIMETHOPRIM 800-160 MG PO TABS: 1.0000 | ORAL_TABLET | Freq: Two times a day (BID) | ORAL | 0 refills | Status: AC

## 2018-05-27 MED ORDER — CLOTRIMAZOLE 1 % EX CREA: TOPICAL_CREAM | CUTANEOUS | 0 refills | Status: DC

## 2018-05-27 NOTE — ED Triage Notes (Signed)
C/O left middle toe pain with lesion just underneath toenail x 2 wks without known injury.

## 2018-05-27 NOTE — ED Provider Notes (Signed)
[facility]    CSN: [id number] Arrival date & time: 05/27/18  1351     History   Chief Complaint Chief Complaint  Patient presents with  . Toe Pain    HPI Kaitlyn Castillo is a 57 y.o. female.   Kaitlyn Castillo presents with complaints of left foot middle toe pain which has been worsening over the past few weeks. She has felt swelling. Has had a lot of activity at home so has delayed being seen- graduations, birthday, new baby etc. Has had severe cellulitis of the neighboring toe in the past and she states it started out as pain therefore she is concerned. She follows with podiatry but has not been able to go recently, last seen 01/17/2018. She is diabetic. Uses orthotics. No fevers. No pain up leg. Has had onychomycosis and corn/callous in the past. Hx of charcot's.    ROS per HPI.      Past Medical History:  Diagnosis Date  . Arthritis   . Cellulitis of left leg 07/07/2012  . Charcot's joint of foot   . Constipation   . Diabetes mellitus   . Head injury, closed, with brief LOC ([place])   . High cholesterol   . Hypertension   . Knee pain, right   . Obese   . Obesity (BMI 30-39.9) 12/08/2017  . Shortness of breath    with too much fluid- not currently    Patient Active Problem List   Diagnosis Date Noted  . Obesity (BMI 30-39.9) 12/08/2017  . Streptococcal bacteremia 12/06/2017  . Uncontrolled type 2 diabetes mellitus with hyperglycemia ([place]) 12/06/2017  . Hyponatremia 12/06/2017  . Postmenopausal bleeding 08/27/2015  . Adenomyosis 08/27/2015  . Skin ulcer of left foot including toes ([place]) 05/02/2015  . Equinus deformity of foot, acquired 04/02/2015  . Tenosynovitis of left foot 04/02/2015  . Arthritis of knee 08/13/2014  . Onychomycosis 11/02/2013  . Acquired deformity of toe 11/02/2013  . Unspecified venous (peripheral) insufficiency 09/13/2012  . Cellulitis of left leg 07/07/2012  . ANXIETY 11/19/2007  . DIABETES MELLITUS, TYPE II 07/29/2007  .  DEPRESSION 07/29/2007  . Essential hypertension 07/29/2007    Past Surgical History:  Procedure Laterality Date  . carbuncle     back of head  . CESAREAN SECTION    . COLONOSCOPY    . JOINT REPLACEMENT    . KNEE SURGERY     quad  . QUADRICEPS REPAIR    . TOTAL KNEE ARTHROPLASTY Left 08/13/2014   Procedure: TOTAL KNEE ARTHROPLASTY;  [place]: Meredith Pel, MD;  Location: [facility];  Service: Orthopedics;  Laterality: Left;    OB History    Gravida  8   Para  4   Term  4   Preterm      AB  4   Living  4     SAB      TAB  4   Ectopic      Multiple      Live Births  4            Home Medications    Prior to Admission medications   Medication Sig Start Date End Date Taking? Authorizing Provider  exenatide (BYETTA 10 MCG PEN) 10 MCG/0.04ML SOPN injection Inject 10 mcg into the skin 2 (two) times daily with a meal.   Yes [provider]  lisinopril-hydrochlorothiazide (PRINZIDE,ZESTORETIC) 20-12.5 MG tablet Take 1 tablet by mouth daily. 11/28/17  Yes [provider]  clotrimazole (LOTRIMIN) 1 % cream Apply  to affected area 2 times daily 05/27/18   Augusto Gamble B, NP  sulfamethoxazole-trimethoprim (BACTRIM DS) 800-160 MG tablet Take 1 tablet by mouth 2 (two) times daily for 10 days. 05/27/18 06/06/18  Zigmund Gottron, NP    Family History Family History  Problem Relation Age of Onset  . Diabetes Mother   . Hyperlipidemia Mother   . Hypertension Mother   . Diabetes Father   . Hyperlipidemia Father   . Hypertension Father   . Cancer Brother   . Heart disease Brother   . Hypertension Brother   . Heart attack Brother     Social History Social History   Tobacco Use  . Smoking status: Former Smoker    Years: 0.00    Types: Cigarettes    Last attempt to quit: 12/13/1978    Years since quitting: 39.4  . Smokeless tobacco: Never Used  Substance Use Topics  . Alcohol use: No    Alcohol/week: 0.0 oz  . Drug use: No     Allergies     Iodine; Shellfish allergy; Aleve [naproxen sodium]; Iohexol; and Percocet [oxycodone-acetaminophen]   Review of Systems Review of Systems   Physical Exam Triage Vital Signs ED Triage Vitals  Enc Vitals Group     BP 05/27/18 1442 (!) 146/78     Pulse Rate 05/27/18 1442 70     Resp 05/27/18 1442 16     Temp 05/27/18 1442 98.3 F (36.8 C)     Temp Source 05/27/18 1442 Oral     SpO2 05/27/18 1442 100 %     Weight --      Height --      Head Circumference --      Peak Flow --      Pain Score 05/27/18 1443 4     Pain Loc --      Pain Edu? --      Excl. in [place]? --    No data found.  Updated Vital Signs BP (!) 146/78   Pulse 70   Temp 98.3 F (36.8 C) (Oral)   Resp 16   LMP 08/22/2015   SpO2 100%   Visual Acuity Right Eye Distance:   Left Eye Distance:   Bilateral Distance:    Right Eye Near:   Left Eye Near:    Bilateral Near:     Physical Exam  Constitutional: She is oriented to person, place, and time. She appears well-developed and well-nourished. No distress.  Cardiovascular: Normal rate, regular rhythm and normal heart sounds.  Pulmonary/Chest: Effort normal and breath sounds normal.  Musculoskeletal:       Left ankle: Normal.  Left toes with contractures noted; strong pedal dorsalis pulses; middle left toe with generalized tenderness and pain with passive ROM; flat callous noted; skin breakdown between toes noted; mild swelling; no significant redness; thickened and dark nails to all toes; see photos   Neurological: She is alert and oriented to person, place, and time.  Skin: Skin is warm and dry.         UC Treatments / Results  Labs (all labs ordered are listed, but only abnormal results are displayed) Labs Reviewed - No data to display  EKG None  Radiology No results found.  Procedures Procedures (including critical care time)  Medications Ordered in UC Medications - No data to display  Initial Impression / Assessment and Plan / UC  Course  I have reviewed the triage vital signs and the nursing notes.  Pertinent labs &  imaging results that were available during my care of the patient were reviewed by me and considered in my medical decision making (see chart for details).     History of infection which became severe as well as dm, will cover with bactrim at this time due to severity of pain. Clotrimazole for skin breakdown. Follow up with podiatry for debridement and recheck. Patient verbalized understanding and agreeable to plan.    Final Clinical Impressions(s) / UC Diagnoses   Final diagnoses:  Pain of toe of left foot  Corn or callus  Onychomycosis  Tinea pedis of left foot     Discharge Instructions     With previous history of infection and increased pain we will provide course of antibiotics. Corn presence, please follow up with podiatry. Use of Lotrimin cream in between toes for fungal infection. If increased redness, swelling or pain please return. Please follow up with podiatry in the next 1-2 weeks for recheck.    ED Prescriptions    Medication Sig Dispense Auth. Provider   sulfamethoxazole-trimethoprim (BACTRIM DS) 800-160 MG tablet Take 1 tablet by mouth 2 (two) times daily for 10 days. 20 tablet Augusto Gamble B, NP   clotrimazole (LOTRIMIN) 1 % cream Apply to affected area 2 times daily 15 g Augusto Gamble B, NP     Controlled Substance Prescriptions [place] Controlled Substance Registry consulted? Not Applicable   Zigmund Gottron, NP 05/27/18 1553

## 2018-05-27 NOTE — Discharge Instructions (Signed)
With previous history of infection and increased pain we will provide course of antibiotics. Corn presence, please follow up with podiatry. Use of Lotrimin cream in between toes for fungal infection. If increased redness, swelling or pain please return. Please follow up with podiatry in the next 1-2 weeks for recheck.

## 2018-05-31 ENCOUNTER — Ambulatory Visit: Payer: Medicaid Other | Admitting: Podiatry

## 2018-06-06 ENCOUNTER — Ambulatory Visit: Payer: Medicaid Other | Admitting: Podiatry

## 2018-06-06 DIAGNOSIS — M79672 Pain in left foot: Secondary | ICD-10-CM

## 2018-06-06 DIAGNOSIS — L97521 Non-pressure chronic ulcer of other part of left foot limited to breakdown of skin: Secondary | ICD-10-CM | POA: Diagnosis not present

## 2018-06-06 DIAGNOSIS — B351 Tinea unguium: Secondary | ICD-10-CM | POA: Diagnosis not present

## 2018-06-06 DIAGNOSIS — M79671 Pain in right foot: Secondary | ICD-10-CM

## 2018-06-06 NOTE — Patient Instructions (Signed)
Seen for ulcerating corn and hypertrophic nails. All nails debrided. Ulcerating lesion debrided and dressed with Amerigel ointment and buttress pad. Keep the pad on during the day. Return in one months or sooner if needed.

## 2018-06-06 NOTE — Progress Notes (Signed)
Subjective: 57 y.o. year old female patient presents complaining of painful toes. Patient requests toe nails, corns and calluses trimmed.  Been to ER for severe pain in 3rd toe left. Positive history of recurrent ulcer 3rd digit left foot.  Objective: Dermatologic: Thick yellow deformed nails x 10. Pre ulcerative digital corn distal end 2nd and 3rd left foot, painful. Vascular: Pedal pulses are all palpable. Orthopedic: Contracted lesser digits with pre ulcerative lesions 3rd left foot. Neurologic: All epicritic and tactile sensations grossly intact.  Assessment: Dystrophic mycotic nails x 10. Ulcerating corn 3rd left, limited to breakdown of skin and is recurring problem. Severe digital contracture 2nd left.  Treatment: All mycotic nails, corns, calluses debrided.  Ulcerative lesion debrided and buttress pad placed 3rd left. Return in one month or sooner if condition gets worse.

## 2018-06-07 ENCOUNTER — Encounter: Payer: Self-pay | Admitting: Podiatry

## 2018-06-27 ENCOUNTER — Encounter (HOSPITAL_BASED_OUTPATIENT_CLINIC_OR_DEPARTMENT_OTHER): Payer: Medicaid Other | Attending: Internal Medicine

## 2018-06-27 DIAGNOSIS — I10 Essential (primary) hypertension: Secondary | ICD-10-CM | POA: Diagnosis not present

## 2018-06-27 DIAGNOSIS — Z6841 Body Mass Index (BMI) 40.0 and over, adult: Secondary | ICD-10-CM | POA: Diagnosis not present

## 2018-06-27 DIAGNOSIS — E11621 Type 2 diabetes mellitus with foot ulcer: Secondary | ICD-10-CM | POA: Diagnosis present

## 2018-06-27 DIAGNOSIS — E669 Obesity, unspecified: Secondary | ICD-10-CM | POA: Insufficient documentation

## 2018-06-27 DIAGNOSIS — E11319 Type 2 diabetes mellitus with unspecified diabetic retinopathy without macular edema: Secondary | ICD-10-CM | POA: Diagnosis not present

## 2018-06-27 DIAGNOSIS — L97528 Non-pressure chronic ulcer of other part of left foot with other specified severity: Secondary | ICD-10-CM | POA: Diagnosis not present

## 2018-06-27 DIAGNOSIS — Z96652 Presence of left artificial knee joint: Secondary | ICD-10-CM | POA: Insufficient documentation

## 2018-06-27 DIAGNOSIS — E11610 Type 2 diabetes mellitus with diabetic neuropathic arthropathy: Secondary | ICD-10-CM | POA: Insufficient documentation

## 2018-06-27 DIAGNOSIS — E1140 Type 2 diabetes mellitus with diabetic neuropathy, unspecified: Secondary | ICD-10-CM | POA: Insufficient documentation

## 2018-06-27 DIAGNOSIS — Z794 Long term (current) use of insulin: Secondary | ICD-10-CM | POA: Insufficient documentation

## 2018-07-06 ENCOUNTER — Ambulatory Visit: Payer: Medicaid Other | Admitting: Podiatry

## 2018-10-31 IMAGING — CR DG TIBIA/FIBULA 2V*L*
4 series · 4 of 4 positions shown · non-contrast
Comparison: None.

CLINICAL DATA: 56-year-old female with left lower extremity redness
and swelling.

EXAM:
LEFT TIBIA AND FIBULA - 2 VIEW

[tibia ap (1 of 2)]
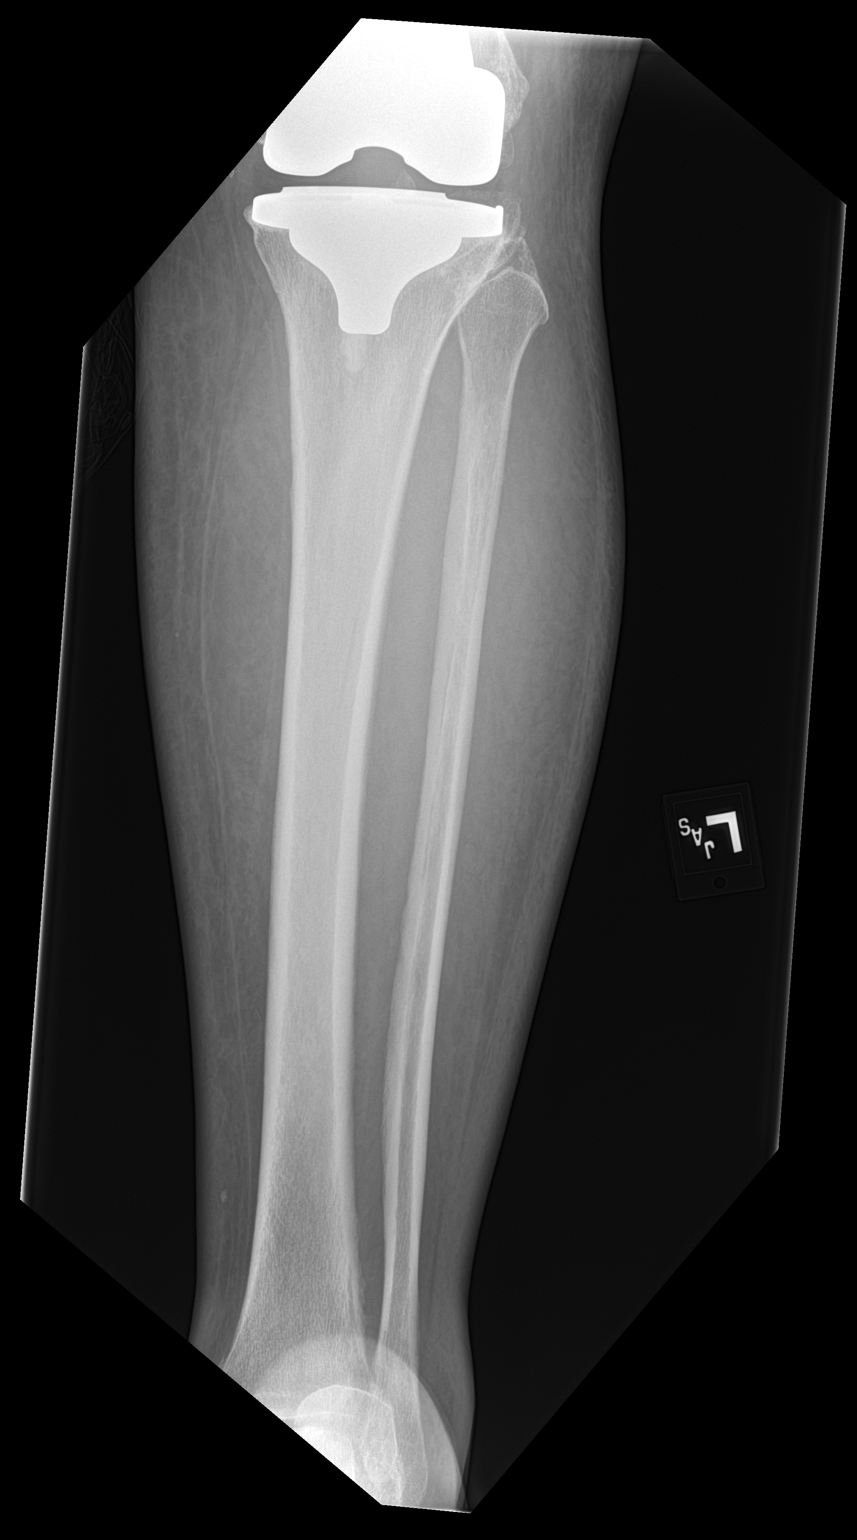

[tibia ap (2 of 2)]
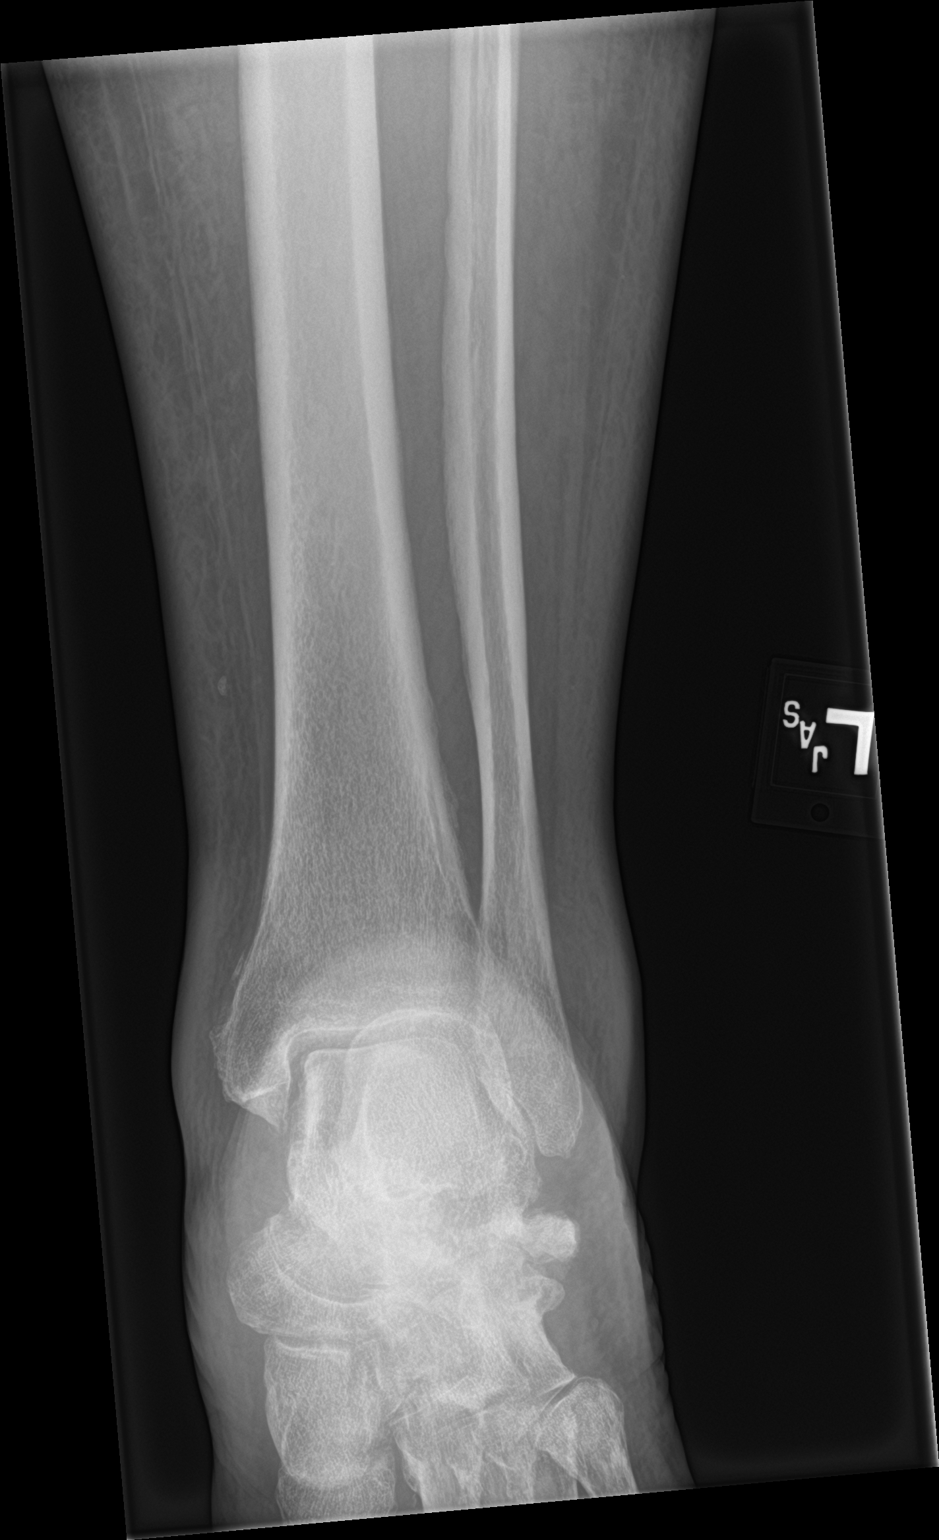

[tibia lat (1 of 2)]
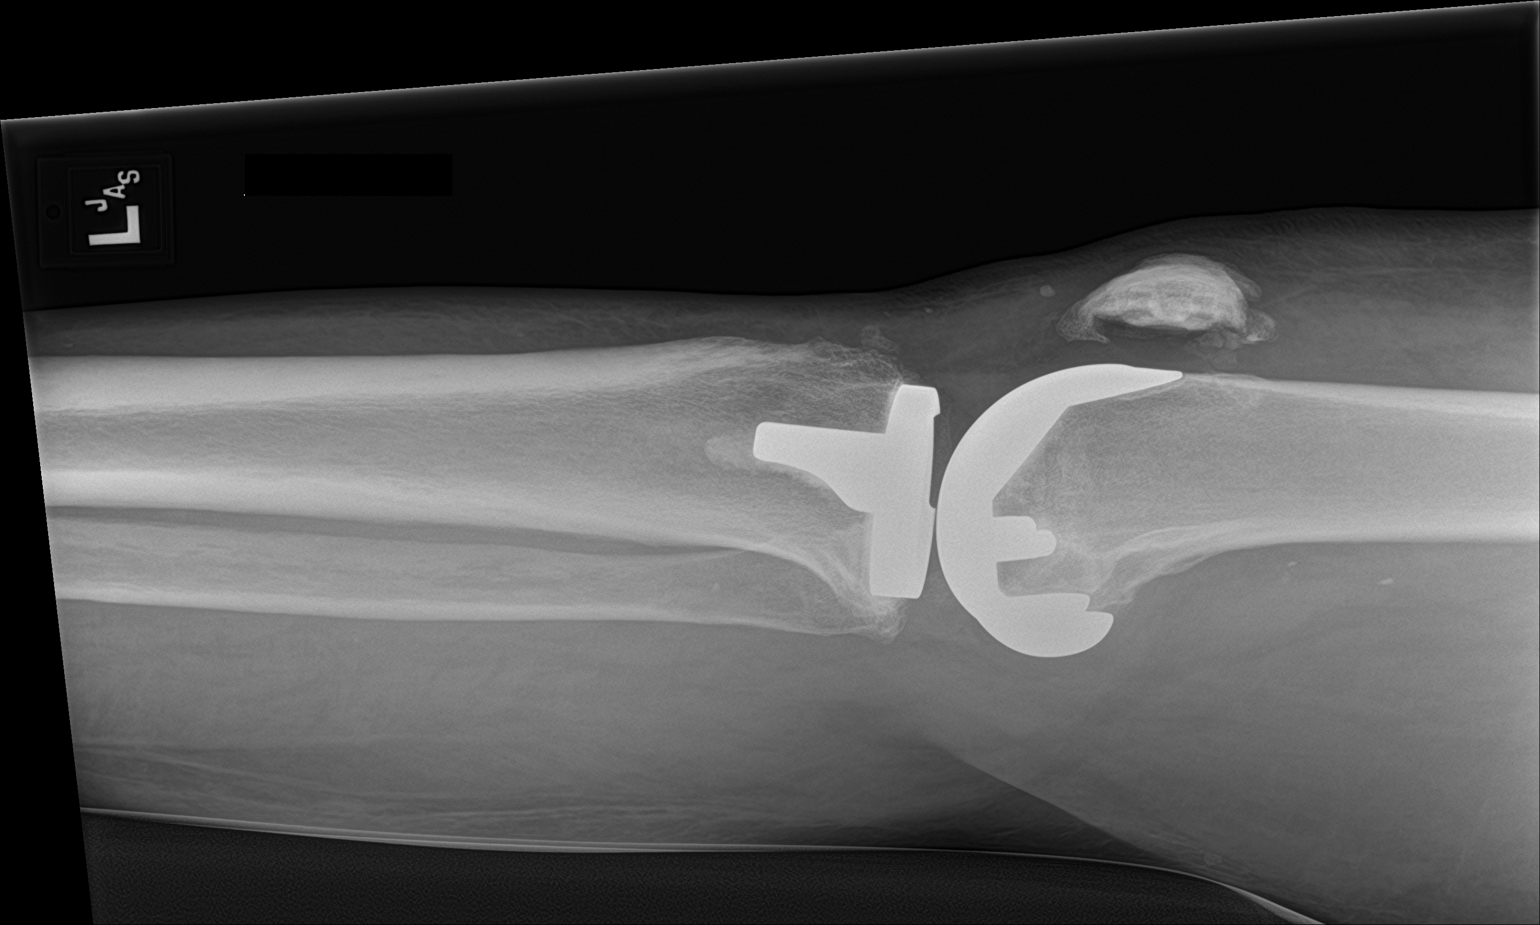

[tibia lat (2 of 2)]
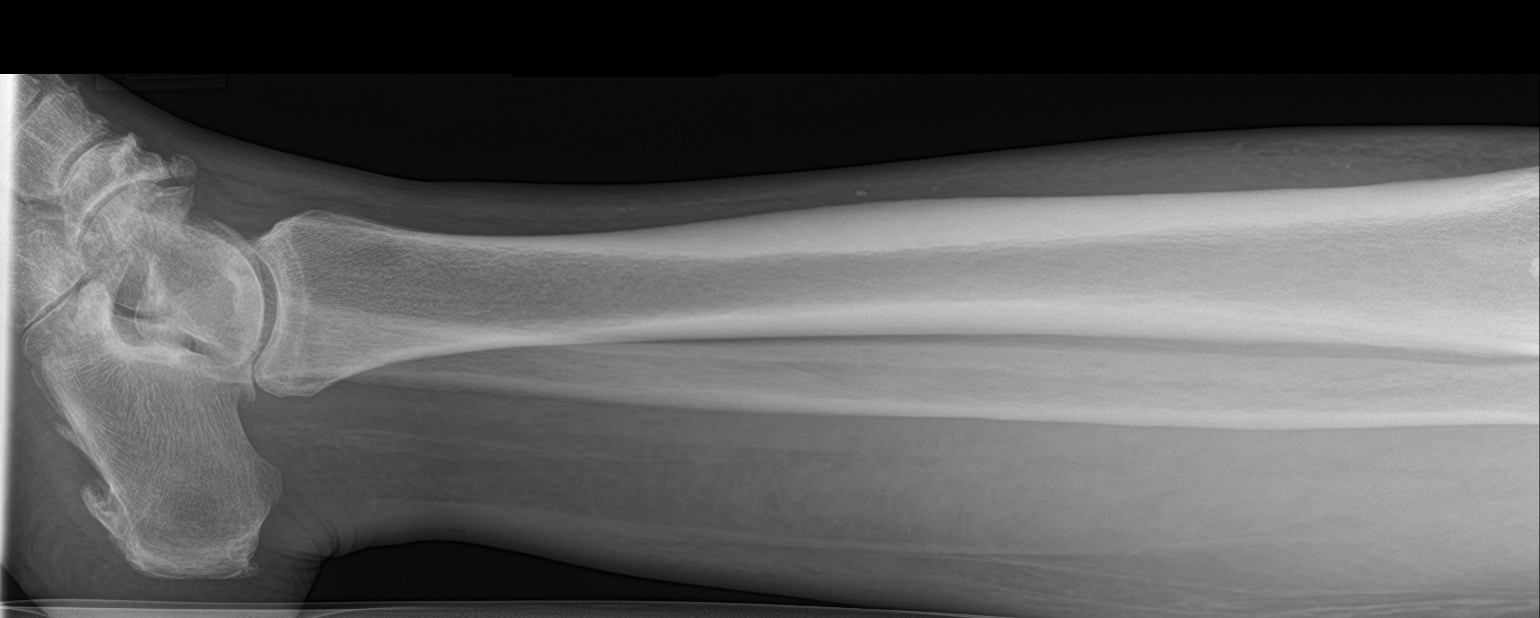

[4 of 4 positions shown; findings below may reference images not displayed]

FINDINGS: There is a total left knee arthroplasty which appears intact and in
anatomic alignment. There is no acute fracture or dislocation. A
small suprapatellar effusion may be present. There is degenerative
changes of the tarsal bones. Mild diffuse subcutaneous edema may
represent cellulitis. Clinical correlation is recommended. No soft
tissue air.
IMPRESSION: 1. No acute fracture or dislocation.
2. Left knee arthroplasty appears intact.
3. Mild diffuse subcutaneous edema, likely cellulitis. Clinical
correlation is recommended. No soft tissue air.

## 2018-11-01 ENCOUNTER — Ambulatory Visit: Payer: Medicaid Other | Admitting: Podiatry

## 2018-11-01 ENCOUNTER — Encounter: Payer: Self-pay | Admitting: Podiatry

## 2018-11-01 ENCOUNTER — Encounter

## 2018-11-01 VITALS — BP 159/84

## 2018-11-01 DIAGNOSIS — E11610 Type 2 diabetes mellitus with diabetic neuropathic arthropathy: Secondary | ICD-10-CM

## 2018-11-01 DIAGNOSIS — M79674 Pain in right toe(s): Secondary | ICD-10-CM

## 2018-11-01 DIAGNOSIS — M79675 Pain in left toe(s): Secondary | ICD-10-CM | POA: Diagnosis not present

## 2018-11-01 DIAGNOSIS — L84 Corns and callosities: Secondary | ICD-10-CM

## 2018-11-01 DIAGNOSIS — B351 Tinea unguium: Secondary | ICD-10-CM

## 2018-11-01 DIAGNOSIS — E119 Type 2 diabetes mellitus without complications: Secondary | ICD-10-CM

## 2018-11-01 NOTE — Patient Instructions (Addendum)
Diabetes and Foot Care Diabetes may cause you to have problems because of poor blood supply (circulation) to your feet and legs. This may cause the skin on your feet to become thinner, break easier, and heal more slowly. Your skin may become dry, and the skin may peel and crack. You may also have nerve damage in your legs and feet causing decreased feeling in them. You may not notice minor injuries to your feet that could lead to infections or more serious problems. Taking care of your feet is one of the most important things you can do for yourself. Follow these instructions at home:  Wear shoes at all times, even in the house. Do not go barefoot. Bare feet are easily injured.  Check your feet daily for blisters, cuts, and redness. If you cannot see the bottom of your feet, use a mirror or ask someone for help.  Wash your feet with warm water (do not use hot water) and mild soap. Then pat your feet and the areas between your toes until they are completely dry. Do not soak your feet as this can dry your skin.  Apply a moisturizing lotion or petroleum jelly (that does not contain alcohol and is unscented) to the skin on your feet and to dry, brittle toenails. Do not apply lotion between your toes.  Trim your toenails straight across. Do not dig under them or around the cuticle. File the edges of your nails with an emery board or nail file.  Do not cut corns or calluses or try to remove them with medicine.  Wear clean socks or stockings every day. Make sure they are not too tight. Do not wear knee-high stockings since they may decrease blood flow to your legs.  Wear shoes that fit properly and have enough cushioning. To break in new shoes, wear them for just a few hours a day. This prevents you from injuring your feet. Always look in your shoes before you put them on to be sure there are no objects inside.  Do not cross your legs. This may decrease the blood flow to your feet.  If you find a  minor scrape, cut, or break in the skin on your feet, keep it and the skin around it clean and dry. These areas may be cleansed with mild soap and water. Do not cleanse the area with peroxide, alcohol, or iodine.  When you remove an adhesive bandage, be sure not to damage the skin around it.  If you have a wound, look at it several times a day to make sure it is healing.  Do not use heating pads or hot water bottles. They may burn your skin. If you have lost feeling in your feet or legs, you may not know it is happening until it is too late.  Make sure your health care provider performs a complete foot exam at least annually or more often if you have foot problems. Report any cuts, sores, or bruises to your health care provider immediately. Contact a health care provider if:  You have an injury that is not healing.  You have cuts or breaks in the skin.  You have an ingrown nail.  You notice redness on your legs or feet.  You feel burning or tingling in your legs or feet.  You have pain or cramps in your legs and feet.  Your legs or feet are numb.  Your feet always feel cold. Get help right away if:  There is increasing   redness, swelling, or pain in or around a wound.  There is a red line that goes up your leg.  Pus is coming from a wound.  You develop a fever or as directed by your health care provider.  You notice a bad smell coming from an ulcer or wound. This information is not intended to replace advice given to you by your health care provider. Make sure you discuss any questions you have with your health care provider. Document Released: 11/26/2000 Document Revised: 05/06/2016 Document Reviewed: 05/08/2013 Elsevier Interactive Patient Education  2017 [place] are small areas of thickened skin that occur on the top, sides, or tip of a toe. They contain a cone-shaped core with a point that can press on a nerve below. This causes pain.  Calluses are areas of thickened skin that can occur anywhere on the body including hands, fingers, palms, soles of the feet, and heels.Calluses are usually larger than corns. What are the causes? Corns and calluses are caused by rubbing (friction) or pressure, such as from shoes that are too tight or do not fit properly. What increases the risk? Corns are more likely to develop in people who have toe deformities, such as hammer toes. Since calluses can occur with friction to any area of the skin, calluses are more likely to develop in people who:  Work with their hands.  Wear shoes that fit poorly, shoes that are too tight, or shoes that are high-heeled.  Have toes deformities.  What are the signs or symptoms? Symptoms of a corn or callus include:  A hard growth on the skin.  Pain or tenderness under the skin.  Redness and swelling.  Increased discomfort while wearing tight-fitting shoes.  How is this diagnosed? Corns and calluses may be diagnosed with a medical history and physical exam. How is this treated? Corns and calluses may be treated with:  Removing the cause of the friction or pressure. This may include: ? Changing your shoes. ? Wearing shoe inserts (orthotics) or other protective layers in your shoes, such as a corn pad. ? Wearing gloves.  Medicines to help soften skin in the hardened, thickened areas.  Reducing the size of the corn or callus by removing the dead layers of skin.  Antibiotic medicines to treat infection.  Surgery, if a toe deformity is the cause.  Follow these instructions at home:  Take medicines only as directed by your health care provider.  If you were prescribed an antibiotic, finish all of it even if you start to feel better.  Wear shoes that fit well. Avoid wearing high-heeled shoes and shoes that are too tight or too loose.  Wear any padding, protective layers, gloves, or orthotics as directed by your health care provider.  Soak  your hands or feet and then use a file or pumice stone to soften your corn or callus. Do this as directed by your health care provider.  Check your corn or callus every day for signs of infection. Watch for: ? Redness, swelling, or pain. ? Fluid, blood, or pus. Contact a health care provider if:  Your symptoms do not improve with treatment.  You have increased redness, swelling, or pain at the site of your corn or callus.  You have fluid, blood, or pus coming from your corn or callus.  You have new symptoms. This information is not intended to replace advice given to you by your health care provider. Make sure you discuss any questions  you have with your health care provider. Document Released: 09/04/2004 Document Revised: 06/18/2016 Document Reviewed: 11/25/2014 Elsevier Interactive Patient Education  2018 [place] Toe Hammer toe is a change in the shape (a deformity) of your second, third, or fourth toe. The deformity causes the middle joint of your toe to stay bent. This causes pain, especially when you are wearing shoes. Hammer toe starts gradually. At first, the toe can be straightened. Gradually over time, the deformity becomes stiff and permanent. Early treatments to keep the toe straight may relieve pain. As the deformity becomes stiff and permanent, surgery may be needed to straighten the toe. What are the causes? Hammer toe is caused by abnormal bending of the toe joint that is closest to your foot. It happens gradually over time. This pulls on the muscles and connections (tendons) of the toe joint, making them weak and stiff. It is often related to wearing shoes that are too short or narrow and do not let your toes straighten. What increases the risk? You may be at greater risk for hammer toe if you:  Are female.  Are older.  Wear shoes that are too small.  Wear high-heeled shoes that pinch your toes.  Are a Engineer, mining.  Have a second toe that is longer  than your big toe (first toe).  Injure your foot or toe.  Have arthritis.  Have a family history of hammer toe.  Have a nerve or muscle disorder.  What are the signs or symptoms? The main symptoms of this condition are pain and deformity of the toe. The pain is worse when wearing shoes, walking, or running. Other symptoms may include:  Corns or calluses over the bent part of the toe or between the toes.  Redness and a burning feeling on the toe.  An open sore that forms on the top of the toe.  Not being able to straighten the toe.  How is this diagnosed? This condition is diagnosed based on your symptoms and a physical exam. During the exam, your health care provider will try to straighten your toe to see how stiff the deformity is. You may also have tests, such as:  A blood test to check for rheumatoid arthritis.  An X-ray to show how severe the deformity is.  How is this treated? Treatment for this condition will depend on how stiff the deformity is. Surgery is often needed. However, sometimes a hammer toe can be straightened without surgery. Treatments that do not involve surgery include:  Taping the toe into a straightened position.  Using pads and cushions to protect the toe (orthotics).  Wearing shoes that provide enough room for the toes.  Doing toe-stretching exercises at home.  Taking an NSAID to reduce pain and swelling.  If these treatments do not help or the toe cannot be straightened, surgery is the next option. The most common surgeries used to straighten a hammer toe include:  Arthroplasty. In this procedure, part of the joint is removed, and that allows the toe to straighten.  Fusion. In this procedure, cartilage between the two bones of the joint is taken out and the bones are fused together into one longer bone.  Implantation. In this procedure, part of the bone is removed and replaced with an implant to let the toe move again.  Flexor tendon  transfer. In this procedure, the tendons that curl the toes down (flexor tendons) are repositioned.  Follow these instructions at home:  Take over-the-counter and prescription  medicines only as told by your health care provider.  Do toe straightening and stretching exercises as told by your health care provider.  Keep all follow-up visits as told by your health care provider. This is important. How is this prevented?  Wear shoes that give your toes enough room and do not cause pain.  Do not wear high-heeled shoes. Contact a health care provider if:  Your pain gets worse.  Your toe becomes red or swollen.  You develop an open sore on your toe. This information is not intended to replace advice given to you by your health care provider. Make sure you discuss any questions you have with your health care provider. Document Released: 11/26/2000 Document Revised: 06/18/2016 Document Reviewed: 03/24/2016 Elsevier Interactive Patient Education  2018 [employer].  Diabetic Neuropathy Diabetic neuropathy is a nerve disease or nerve damage that is caused by diabetes mellitus. About half of all people with diabetes mellitus have some form of nerve damage. Nerve damage is more common in those who have had diabetes mellitus for many years and who generally have not had good control of their blood sugar (glucose) level. Diabetic neuropathy is a common complication of diabetes mellitus. There are three common types of diabetic neuropathy and a fourth type that is less common and less understood:  Peripheral neuropathy-This is the most common type of diabetic neuropathy. It causes damage to the nerves of the feet and legs first and then eventually the hands and arms. The damage affects the ability to sense touch.  Autonomic neuropathy-This type causes damage to the autonomic nervous system, which controls the following functions: ? Heartbeat. ? Body temperature. ? Blood  pressure. ? Urination. ? Digestion. ? Sweating. ? Sexual function.  Focal neuropathy-Focal neuropathy can be painful and unpredictable and occurs most often in older adults with diabetes mellitus. It involves a specific nerve or one area and often comes on suddenly. It usually does not cause long-term problems.  Radiculoplexus neuropathy- Sometimes called lumbosacral radiculoplexus neuropathy, radiculoplexus neuropathy affects the nerves of the thighs, hips, buttocks, or legs. It is more common in people with type 2 diabetes mellitus and in older men. It is characterized by debilitating pain, weakness, and atrophy, usually in the thigh muscles.  What are the causes? The cause of peripheral, autonomic, and focal neuropathies is diabetes mellitus that is uncontrolled and high glucose levels. The cause of radiculoplexus neuropathy is unknown. However, it is thought to be caused by inflammation related to uncontrolled glucose levels. What are the signs or symptoms? Peripheral Neuropathy Peripheral neuropathy develops slowly over time. When the nerves of the feet and legs no longer work there may be:  Burning, stabbing, or aching pain in the legs or feet.  Inability to feel pressure or pain in your feet. This can lead to: ? Thick calluses over pressure areas. ? Pressure sores. ? Ulcers.  Foot deformities.  Reduced ability to feel temperature changes.  Muscle weakness.  Autonomic Neuropathy The symptoms of autonomic neuropathy vary depending on which nerves are affected. Symptoms may include:  Problems with digestion, such as: ? Feeling sick to your stomach (nausea). ? Vomiting. ? Bloating. ? Constipation. ? Diarrhea. ? Abdominal pain.  Difficulty with urination. This occurs if you lose your ability to sense when your bladder is full. Problems include: ? Urine leakage (incontinence). ? Inability to empty your bladder completely (retention).  Rapid or irregular heartbeat  (palpitations).  Blood pressure drops when you stand up (orthostatic hypotension). When you stand up  you may feel: ? Dizzy. ? Weak. ? Faint.  In men, inability to attain and maintain an erection.  In women, vaginal dryness and problems with decreased sexual desire and arousal.  Problems with body temperature regulation.  Increased or decreased sweating.  Focal Neuropathy  Abnormal eye movements or abnormal alignment of both eyes.  Weakness in the wrist.  Foot drop. This results in an inability to lift the foot properly and abnormal walking or foot movement.  Paralysis on one side of your face (Bell palsy).  Chest or abdominal pain. Radiculoplexus Neuropathy  Sudden, severe pain in your hip, thigh, or buttocks.  Weakness and wasting of thigh muscles.  Difficulty rising from a seated position.  Abdominal swelling.  Unexplained weight loss (usually more than 10 lb [4.5 kg]). How is this diagnosed? Peripheral Neuropathy Your senses may be tested. Sensory function testing can be done with:  A light touch using a monofilament.  A vibration with tuning fork.  A sharp sensation with a pin prick.  Other tests that can help diagnose neuropathy are:  Nerve conduction velocity. This test checks the transmission of an electrical current through a nerve.  Electromyography. This shows how muscles respond to electrical signals transmitted by nearby nerves.  Quantitative sensory testing. This is used to assess how your nerves respond to vibrations and changes in temperature.  Autonomic Neuropathy Diagnosis is often based on reported symptoms. Tell your health care provider if you experience:  Dizziness.  Constipation.  Diarrhea.  Inappropriate urination or inability to urinate.  Inability to get or maintain an erection.  Tests that may be done include:  Electrocardiography or Holter monitor. These are tests that can help show problems with the heart rate or heart  rhythm.  An X-ray exam may be done.  Focal Neuropathy Diagnosis is made based on your symptoms and what your health care provider finds during your exam. Other tests may be done. They may include:  Nerve conduction velocities. This checks the transmission of electrical current through a nerve.  Electromyography. This shows how muscles respond to electrical signals transmitted by nearby nerves.  Quantitative sensory testing. This test is used to assess how your nerves respond to vibration and changes in temperature.  Radiculoplexus Neuropathy  Often the first thing is to eliminate any other issue or problems that might be the cause, as there is no standard test for diagnosis.  X-ray exam of your spine and lumbar region.  Spinal tap to rule out cancer.  MRI to rule out other lesions. How is this treated? Once nerve damage occurs, it cannot be reversed. The goal of treatment is to keep the disease or nerve damage from getting worse and affecting more nerve fibers. Controlling your blood glucose level is the key. Most people with radiculoplexus neuropathy see at least a partial improvement over time. You will need to keep your blood glucose and HbA1c levels in the target range determined by your health care provider. Things that help control blood glucose levels include:  Blood glucose monitoring.  Meal planning.  Physical activity.  Diabetes medicine.  Over time, maintaining lower blood glucose levels helps lessen symptoms. Sometimes, prescription pain medicine is needed. Follow these instructions at home:  Do not smoke.  Keep your blood glucose level in the range that you and your health care provider have determined acceptable for you.  Keep your blood pressure level in the range that you and your health care provider have determined acceptable for you.  Eat a  well-balanced diet.  Be physically active every day. Include strength training and balance exercises.  Protect  your feet. ? Check your feet every day for sores, cuts, blisters, or signs of infection. ? Wear padded socks and supportive shoes. Use orthotic inserts, if necessary. ? Regularly check the insides of your shoes for worn spots. Make sure there are no rocks or other items inside your shoes before you put them on. Contact a health care provider if:  You have burning, stabbing, or aching pain in the legs or feet.  You are unable to feel pressure or pain in your feet.  You develop problems with digestion such as: ? Nausea. ? Vomiting. ? Bloating. ? Constipation. ? Diarrhea. ? Abdominal pain.  You have difficulty with urination, such as: ? Incontinence. ? Retention.  You have palpitations.  You develop orthostatic hypotension. When you stand up you may feel: ? Dizzy. ? Weak. ? Faint.  You cannot attain and maintain an erection (in men).  You have vaginal dryness and problems with decreased sexual desire and arousal (in women).  You have severe pain in your thighs, legs, or buttocks.  You have unexplained weight loss. This information is not intended to replace advice given to you by your health care provider. Make sure you discuss any questions you have with your health care provider. Document Released: 02/07/2002 Document Revised: 05/06/2016 Document Reviewed: 05/10/2013 Elsevier Interactive Patient Education  2017 [employer].

## 2018-11-01 NOTE — Progress Notes (Signed)
Subjective: Kaitlyn Castillo presents today to establish diabetic foot care.  She is a former patient of Dr. Caffie Pinto.  Patient relates history of painful, discolored, thick toenails which interfere with daily activities.  She also relates history of Charcot to the left foot.  She relates she has been pretty active as far as exercise goes participating in sauna room, weight lifting and hot tub.  He does relate history of wound with subsequent osteomyelitis to her left second digit which required aggressive wound care, hyperbaric oxygen and infectious disease consult.  The digit has since healed and she is having no problems with at present.  Past Medical History:  Diagnosis Date  . Arthritis   . Cellulitis of left leg 07/07/2012  . Charcot's joint of foot   . Constipation   . Diabetes mellitus   . Head injury, closed, with brief LOC ([place])   . High cholesterol   . Hypertension   . Knee pain, right   . Obese   . Obesity (BMI 30-39.9) 12/08/2017  . Shortness of breath    with too much fluid- not currently   Past Surgical History:  Procedure Laterality Date  . carbuncle     back of head  . CESAREAN SECTION    . COLONOSCOPY    . JOINT REPLACEMENT    . KNEE SURGERY     quad  . QUADRICEPS REPAIR    . TOTAL KNEE ARTHROPLASTY Left 08/13/2014   Procedure: TOTAL KNEE ARTHROPLASTY;  Kushnir: Meredith Pel, MD;  Location: [facility];  Service: Orthopedics;  Laterality: Left;   Current Outpatient Medications:  .  clotrimazole (LOTRIMIN) 1 % cream, Apply to affected area 2 times daily, Disp: 15 g, Rfl: 0 .  Dulaglutide (TRULICITY) 1.5 NI/7.7OE SOPN, Inject into the skin., Disp: , Rfl:  .  lisinopril-hydrochlorothiazide (PRINZIDE,ZESTORETIC) 20-12.5 MG tablet, Take 1 tablet by mouth daily., Disp: , Rfl: 0 .  ACCU-CHEK AVIVA PLUS test strip, 3 (three) times daily. for testing, Disp: , Rfl: 5 .  ACCU-CHEK FASTCLIX LANCETS MISC, TEST QID, Disp: , Rfl: 2 .  atenolol (TENORMIN) 25 MG tablet, Take  by mouth., Disp: , Rfl:  .  exenatide (BYETTA 10 MCG PEN) 10 MCG/0.04ML SOPN injection, Inject 10 mcg into the skin 2 (two) times daily with a meal., Disp: , Rfl:  .  phentermine 37.5 MG capsule, Take by mouth., Disp: , Rfl:  .  simvastatin (ZOCOR) 40 MG tablet, Take by mouth., Disp: , Rfl:  .  VOLTAREN 1 % GEL, APPLY 2 GRAMS TO AFFECTED AREAS QID, Disp: , Rfl: 2    Allergies  Allergen Reactions  . Iodine Anaphylaxis and Hives    Certain iodine in fish  . Shellfish Allergy Anaphylaxis    Iodine breaks her out and "stops her breathing"  . Aleve [Naproxen Sodium] Hives  . Iohexol Hives    Can possibly tolerate with Benadryl (but ask first??)   . Percocet [Oxycodone-Acetaminophen] Hives, Itching and Other (See Comments)    Causes paranoia   Social History   Tobacco Use  Smoking Status Former Smoker  . Years: 0.00  . Types: Cigarettes  . Last attempt to quit: 12/13/1978  . Years since quitting: 39.9  Smokeless Tobacco Never Used   Family History  Problem Relation Age of Onset  . Diabetes Mother   . Hyperlipidemia Mother   . Hypertension Mother   . Diabetes Father   . Hyperlipidemia Father   . Hypertension Father   . Cancer Brother   .  Heart disease Brother   . Hypertension Brother   . Heart attack Brother    Review of systems:  Constitutional: Denies chills, fatigue, fever, sweats, weight change Communication: Optometrist, sign Ecologist, hand writing, iPad/Android device Eyes: Denies diplopia, glare,  light sensitivity, eyeglasses Ears nose mouth throat: Hard of hearing, deaf, sign language, Denies vertigo,  denies bloody nose, denies rhinitis, denies cold sores, denies snoring Cardiovascular: +HTN, edema, arrhythmia, pacemaker in place, defibrillator in place, Denies chest pain/tightness, chronic anticoagulation, blood clot Respiratory: +asthma, Denies difficulty breathing, denies congestion Gastrointestinal: Denies abdominal pain, denies diarrhea, denies  nausea, denies vomiting Genitourinary: Denies nocturia, denies pain on urination, denies blood in urine Musculoskeletal: +h/o foot infection, Uses mobility aid, Denies cramping, stiff joints, +painful joint,  Skin: +changes in toenails, denies color change dryness, itchy skin, mole changes, or rash  Neurological: denies numbness, paresthesias, burning in feet, denies fainting, denies seizure, denies change in speech. denies headaches, memory problems/poor historian, cerebral palsy Endocrine: Denies dry mouth, denies flushing, denies heat intolerance, denies cold intolerance, denies excessive thirst, denies polyuria, denies nocturia Hematological: Denies easy bleeding, denies excessive bleeding, denies easy bruising, denies enlarged lymph nodes Allergy/immunological: Denies hives denies frequent infections Psychiatric:  anxiety, depression, mood disorder, suicidal ideations, hallucinations  Objective: Vitals:   11/01/18 1003  BP: (!) 159/84   Vascular Examination: Capillary refill time immediate x 10 digits Dorsalis pedis and posterior tibial pulses present b/l No digital hair x 10 digits Skin temperature warm to warm b/l  Dermatological Examination: Skin with normal turgor texture and tone bilaterally   Toenails 1-5 b/l discolored, thick, dystrophic with subungual debris and pain with palpation to nailbeds due to thickness of nails. Hyperkeratotic lesion distal tip left, second toe left third toe,  and right third toe.  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  Neurological: Sensation intact with 10 gram monofilament. Vibratory sensation intact.  Assessment: Painful onychomycosis toenails 1-5 b/l  Corns distal tip of digits bilaterally Charcot neuroarthropathy left foot  Plan: 1. Discussed  diabetic foot care principles on today.  Literature dispensed to patient. 2. Toenails 1-5 b/l were debrided in length and girth without iatrogenic bleeding. 3. Corns debrided bilaterally 4. Toe crest dispensed to patient for both feet to prevent toes from developing corns. 5. Patient to continue soft, supportive shoe gear 6. Patient to report any pedal injuries to medical professional immediately. 7. Follow up 3 months. Patient/POA to call should there be a concern in the interim.

## 2018-11-14 ENCOUNTER — Ambulatory Visit: Payer: Medicaid Other | Admitting: Podiatry

## 2019-02-01 ENCOUNTER — Ambulatory Visit: Payer: Medicaid Other | Admitting: Podiatry

## 2019-03-09 ENCOUNTER — Other Ambulatory Visit: Payer: Self-pay

## 2019-03-09 ENCOUNTER — Ambulatory Visit: Payer: Medicaid Other | Admitting: Podiatry

## 2019-03-09 ENCOUNTER — Encounter: Payer: Self-pay | Admitting: Podiatry

## 2019-03-09 VITALS — Temp 98.0°F

## 2019-03-09 DIAGNOSIS — M79675 Pain in left toe(s): Secondary | ICD-10-CM

## 2019-03-09 DIAGNOSIS — M79674 Pain in right toe(s): Secondary | ICD-10-CM | POA: Diagnosis not present

## 2019-03-09 DIAGNOSIS — B351 Tinea unguium: Secondary | ICD-10-CM

## 2019-03-09 DIAGNOSIS — E119 Type 2 diabetes mellitus without complications: Secondary | ICD-10-CM

## 2019-03-12 NOTE — Progress Notes (Signed)
Subjective: 58 y.o. returns the office today for painful, elongated, thickened toenails which they cannot trim themself.  She states that the left big toenail started become ingrown and painful and that is when she knows she is to come and get the nails trimmed.  Denies any redness or drainage around the nails. Denies any acute changes since last appointment and no new complaints today. Denies any systemic complaints such as fevers, chills, nausea, vomiting.   PCP: Benito Mccreedy, MD  Objective: AAO 3, NAD DP/PT pulses palpable, CRT less than 3 seconds Nails hypertrophic, dystrophic, elongated, brittle, discolored 10. There is tenderness overlying the nails 1-5 bilaterally.  There is some dried blood underneath the left hallux toenail.  No open sores.  There is no surrounding erythema or drainage along the nail sites. No open lesions or pre-ulcerative lesions are identified.  Her hallux toenails are becoming ingrown. No other areas of tenderness bilateral lower extremities. No overlying edema, erythema, increased warmth. No pain with calf compression, swelling, warmth, erythema.  Assessment: Patient presents with symptomatic onychomycosis  Plan: -Treatment options including alternatives, risks, complications were discussed -Nails sharply debrided 10 without complication/bleeding. -Discussed daily foot inspection. If there are any changes, to call the office immediately.  -Follow-up in 3 months or sooner if any problems are to arise. In the meantime, encouraged to call the office with any questions, concerns, changes symptoms.  Celesta Gentile, DPM

## 2019-06-08 ENCOUNTER — Other Ambulatory Visit: Payer: Self-pay

## 2019-06-08 ENCOUNTER — Ambulatory Visit: Payer: Medicaid Other | Admitting: Podiatry

## 2019-06-08 ENCOUNTER — Encounter: Payer: Self-pay | Admitting: Podiatry

## 2019-06-08 VITALS — Temp 97.7°F

## 2019-06-08 DIAGNOSIS — M79676 Pain in unspecified toe(s): Secondary | ICD-10-CM

## 2019-06-08 DIAGNOSIS — L84 Corns and callosities: Secondary | ICD-10-CM | POA: Diagnosis not present

## 2019-06-08 DIAGNOSIS — E119 Type 2 diabetes mellitus without complications: Secondary | ICD-10-CM

## 2019-06-08 DIAGNOSIS — B351 Tinea unguium: Secondary | ICD-10-CM

## 2019-06-08 DIAGNOSIS — M79675 Pain in left toe(s): Secondary | ICD-10-CM

## 2019-06-08 DIAGNOSIS — E11610 Type 2 diabetes mellitus with diabetic neuropathic arthropathy: Secondary | ICD-10-CM

## 2019-06-08 NOTE — Patient Instructions (Addendum)
Diabetes Mellitus and Foot Care Foot care is an important part of your health, especially when you have diabetes. Diabetes may cause you to have problems because of poor blood flow (circulation) to your feet and legs, which can cause your skin to:  Become thinner and drier.  Break more easily.  Heal more slowly.  Peel and crack. You may also have nerve damage (neuropathy) in your legs and feet, causing decreased feeling in them. This means that you may not notice minor injuries to your feet that could lead to more serious problems. Noticing and addressing any potential problems early is the best way to prevent future foot problems. How to care for your feet Foot hygiene  Wash your feet daily with warm water and mild soap. Do not use hot water. Then, pat your feet and the areas between your toes until they are completely dry. Do not soak your feet as this can dry your skin.  Trim your toenails straight across. Do not dig under them or around the cuticle. File the edges of your nails with an emery board or nail file.  Apply a moisturizing lotion or petroleum jelly to the skin on your feet and to dry, brittle toenails. Use lotion that does not contain alcohol and is unscented. Do not apply lotion between your toes. Shoes and socks  Wear clean socks or stockings every day. Make sure they are not too tight. Do not wear knee-high stockings since they may decrease blood flow to your legs.  Wear shoes that fit properly and have enough cushioning. Always look in your shoes before you put them on to be sure there are no objects inside.  To break in new shoes, wear them for just a few hours a day. This prevents injuries on your feet. Wounds, scrapes, corns, and calluses  Check your feet daily for blisters, cuts, bruises, sores, and redness. If you cannot see the bottom of your feet, use a mirror or ask someone for help.  Do not cut corns or calluses or try to remove them with medicine.  If you  find a minor scrape, cut, or break in the skin on your feet, keep it and the skin around it clean and dry. You may clean these areas with mild soap and water. Do not clean the area with peroxide, alcohol, or iodine.  If you have a wound, scrape, corn, or callus on your foot, look at it several times a day to make sure it is healing and not infected. Check for: ? Redness, swelling, or pain. ? Fluid or blood. ? Warmth. ? Pus or a bad smell. General instructions  Do not cross your legs. This may decrease blood flow to your feet.  Do not use heating pads or hot water bottles on your feet. They may burn your skin. If you have lost feeling in your feet or legs, you may not know this is happening until it is too late.  Protect your feet from hot and cold by wearing shoes, such as at the beach or on hot pavement.  Schedule a complete foot exam at least once a year (annually) or more often if you have foot problems. If you have foot problems, report any cuts, sores, or bruises to your health care provider immediately. Contact a health care provider if:  You have a medical condition that increases your risk of infection and you have any cuts, sores, or bruises on your feet.  You have an injury that is not   healing.  You have redness on your legs or feet.  You feel burning or tingling in your legs or feet.  You have pain or cramps in your legs and feet.  Your legs or feet are numb.  Your feet always feel cold.  You have pain around a toenail. Get help right away if:  You have a wound, scrape, corn, or callus on your foot and: ? You have pain, swelling, or redness that gets worse. ? You have fluid or blood coming from the wound, scrape, corn, or callus. ? Your wound, scrape, corn, or callus feels warm to the touch. ? You have pus or a bad smell coming from the wound, scrape, corn, or callus. ? You have a fever. ? You have a red line going up your leg. Summary  Check your feet every day  for cuts, sores, red spots, swelling, and blisters.  Moisturize feet and legs daily.  Wear shoes that fit properly and have enough cushioning.  If you have foot problems, report any cuts, sores, or bruises to your health care provider immediately.  Schedule a complete foot exam at least once a year (annually) or more often if you have foot problems. This information is not intended to replace advice given to you by your health care provider. Make sure you discuss any questions you have with your health care provider. Document Released: 11/26/2000 Document Revised: 01/11/2018 Document Reviewed: 12/31/2016 Elsevier Interactive Patient Education  2019 [employer]  Onychomycosis/Fungal Toenails  WHAT IS IT? An infection that lies within the keratin of your nail plate that is caused by a fungus.  WHY ME? Fungal infections affect all ages, sexes, races, and creeds.  There may be many factors that predispose you to a fungal infection such as age, coexisting medical conditions such as diabetes, or an autoimmune disease; stress, medications, fatigue, genetics, etc.  Bottom line: fungus thrives in a warm, moist environment and your shoes offer such a location.  IS IT CONTAGIOUS? Theoretically, yes.  You do not want to share shoes, nail clippers or files with someone who has fungal toenails.  Walking around barefoot in the same room or sleeping in the same bed is unlikely to transfer the organism.  It is important to realize, however, that fungus can spread easily from one nail to the next on the same foot.  HOW DO WE TREAT THIS?  There are several ways to treat this condition.  Treatment may depend on many factors such as age, medications, pregnancy, liver and kidney conditions, etc.  It is best to ask your doctor which options are available to you.  1. No treatment.   Unlike many other medical concerns, you can live with this condition.  However for many people this can be a painful condition and  may lead to ingrown toenails or a bacterial infection.  It is recommended that you keep the nails cut short to help reduce the amount of fungal nail. 2. Topical treatment.  These range from herbal remedies to prescription strength nail lacquers.  About 40-50% effective, topicals require twice daily application for approximately 9 to 12 months or until an entirely new nail has grown out.  The most effective topicals are medical grade medications available through physicians offices. 3. Oral antifungal medications.  With an 80-90% cure rate, the most common oral medication requires 3 to 4 months of therapy and stays in your system for a year as the new nail grows out.  Oral antifungal medications do require   blood work to make sure it is a safe drug for you.  A liver function panel will be performed prior to starting the medication and after the first month of treatment.  It is important to have the blood work performed to avoid any harmful side effects.  In general, this medication safe but blood work is required. 4. Laser Therapy.  This treatment is performed by applying a specialized laser to the affected nail plate.  This therapy is noninvasive, fast, and non-painful.  It is not covered by insurance and is therefore, out of pocket.  The results have been very good with a 80-95% cure rate.  [facility] is the only practice in the area to offer this therapy. 5. Permanent Nail Avulsion.  Removing the entire nail so that a new nail will not grow back. 
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
## 2019-09-04 ENCOUNTER — Other Ambulatory Visit: Payer: Self-pay

## 2019-09-04 DIAGNOSIS — Z20822 Contact with and (suspected) exposure to covid-19: Secondary | ICD-10-CM

## 2019-09-05 LAB — NOVEL CORONAVIRUS, NAA: SARS-CoV-2, NAA: NOT DETECTED

## 2019-09-10 ENCOUNTER — Encounter: Payer: Self-pay | Admitting: Podiatry

## 2019-09-10 ENCOUNTER — Ambulatory Visit: Payer: Medicaid Other | Admitting: Podiatry

## 2019-09-10 ENCOUNTER — Other Ambulatory Visit: Payer: Self-pay

## 2019-09-10 DIAGNOSIS — B353 Tinea pedis: Secondary | ICD-10-CM

## 2019-09-10 DIAGNOSIS — M79675 Pain in left toe(s): Secondary | ICD-10-CM | POA: Diagnosis not present

## 2019-09-10 DIAGNOSIS — B351 Tinea unguium: Secondary | ICD-10-CM

## 2019-09-10 DIAGNOSIS — M79674 Pain in right toe(s): Secondary | ICD-10-CM | POA: Diagnosis not present

## 2019-09-10 DIAGNOSIS — E08610 Diabetes mellitus due to underlying condition with diabetic neuropathic arthropathy: Secondary | ICD-10-CM

## 2019-09-10 DIAGNOSIS — L84 Corns and callosities: Secondary | ICD-10-CM

## 2019-09-10 MED ORDER — CLOTRIMAZOLE-BETAMETHASONE 1-0.05 % EX CREA: TOPICAL_CREAM | CUTANEOUS | 1 refills | Status: DC

## 2019-09-10 NOTE — Patient Instructions (Addendum)
Athlete's Foot  Athlete's foot (tinea pedis) is a fungal infection of the skin on your feet. It often occurs on the skin that is between or underneath the toes. It can also occur on the soles of your feet. The infection can spread from person to person (is contagious). It can also spread when a person's bare feet come in contact with the fungus on shower floors or on items such as shoes. What are the causes? This condition is caused by a fungus that grows in warm, moist places. You can get athlete's foot by sharing shoes, shower stalls, towels, and wet floors with someone who is infected. Not washing your feet or changing your socks often enough can also lead to athlete's foot. What increases the risk? This condition is more likely to develop in:  Men.  People who have a weak body defense system (immune system).  People who have diabetes.  People who use public showers, such as at a gym.  People who wear heavy-duty shoes, such as industrial or military shoes.  Seasons with warm, humid weather. What are the signs or symptoms? Symptoms of this condition include:  Itchy areas between your toes or on the soles of your feet.  White, flaky, or scaly areas between your toes or on the soles of your feet.  Very itchy small blisters between your toes or on the soles of your feet.  Small cuts in your skin. These cuts can become infected.  Thick or discolored toenails. How is this diagnosed? This condition may be diagnosed with a physical exam and a review of your medical history. Your health care provider may also take a skin or toenail sample to examine under a microscope. How is this treated? This condition is treated with antifungal medicines. These may be applied as powders, ointments, or creams. In severe cases, an oral antifungal medicine may be given. Follow these instructions at home: Medicines  Apply or take over-the-counter and prescription medicines only as told by your health  care provider.  Apply your antifungal medicine as told by your health care provider. Do not stop using the antifungal even if your condition improves. Foot care  Do not scratch your feet.  Keep your feet dry: ? Wear cotton or wool socks. Change your socks every day or if they become wet. ? Wear shoes that allow air to flow, such as sandals or canvas tennis shoes.  Wash and dry your feet, including the area between your toes. Also, wash and dry your feet: ? Every day or as told by your health care provider. ? After exercising. General instructions  Do not let others use towels, shoes, nail clippers, or other personal items that touch your feet.  Protect your feet by wearing sandals in wet areas, such as locker rooms and shared showers.  Keep all follow-up visits as told by your health care provider. This is important.  If you have diabetes, keep your blood sugar under control. Contact a health care provider if:  You have a fever.  You have swelling, soreness, warmth, or redness in your foot.  Your feet are not getting better with treatment.  Your symptoms get worse.  You have new symptoms. Summary  Athlete's foot (tinea pedis) is a fungal infection of the skin on your feet. It often occurs on skin that is between or underneath the toes.  This condition is caused by a fungus that grows in warm, moist places.  Symptoms include white, flaky, or scaly areas between   your toes or on the soles of your feet.  This condition is treated with antifungal medicines.  Keep your feet clean. Always dry them thoroughly. This information is not intended to replace advice given to you by your health care provider. Make sure you discuss any questions you have with your health care provider. Document Released: 11/26/2000 Document Revised: 11/24/2017 Document Reviewed: 09/19/2017 Elsevier Patient Education  2020 Else[employer]   Diabetes Mellitus and Foot Care Foot care is an important  part of your health, especially when you have diabetes. Diabetes may cause you to have problems because of poor blood flow (circulation) to your feet and legs, which can cause your skin to:  Become thinner and drier.  Break more easily.  Heal more slowly.  Peel and crack. You may also have nerve damage (neuropathy) in your legs and feet, causing decreased feeling in them. This means that you may not notice minor injuries to your feet that could lead to more serious problems. Noticing and addressing any potential problems early is the best way to prevent future foot problems. How to care for your feet Foot hygiene  Wash your feet daily with warm water and mild soap. Do not use hot water. Then, pat your feet and the areas between your toes until they are completely dry. Do not soak your feet as this can dry your skin.  Trim your toenails straight across. Do not dig under them or around the cuticle. File the edges of your nails with an emery board or nail file.  Apply a moisturizing lotion or petroleum jelly to the skin on your feet and to dry, brittle toenails. Use lotion that does not contain alcohol and is unscented. Do not apply lotion between your toes. Shoes and socks  Wear clean socks or stockings every day. Make sure they are not too tight. Do not wear knee-high stockings since they may decrease blood flow to your legs.  Wear shoes that fit properly and have enough cushioning. Always look in your shoes before you put them on to be sure there are no objects inside.  To break in new shoes, wear them for just a few hours a day. This prevents injuries on your feet. Wounds, scrapes, corns, and calluses  Check your feet daily for blisters, cuts, bruises, sores, and redness. If you cannot see the bottom of your feet, use a mirror or ask someone for help.  Do not cut corns or calluses or try to remove them with medicine.  If you find a minor scrape, cut, or break in the skin on your feet,  keep it and the skin around it clean and dry. You may clean these areas with mild soap and water. Do not clean the area with peroxide, alcohol, or iodine.  If you have a wound, scrape, corn, or callus on your foot, look at it several times a day to make sure it is healing and not infected. Check for: ? Redness, swelling, or pain. ? Fluid or blood. ? Warmth. ? Pus or a bad smell. General instructions  Do not cross your legs. This may decrease blood flow to your feet.  Do not use heating pads or hot water bottles on your feet. They may burn your skin. If you have lost feeling in your feet or legs, you may not know this is happening until it is too late.  Protect your feet from hot and cold by wearing shoes, such as at the beach or on hot pavement.    Schedule a complete foot exam at least once a year (annually) or more often if you have foot problems. If you have foot problems, report any cuts, sores, or bruises to your health care provider immediately. Contact a health care provider if:  You have a medical condition that increases your risk of infection and you have any cuts, sores, or bruises on your feet.  You have an injury that is not healing.  You have redness on your legs or feet.  You feel burning or tingling in your legs or feet.  You have pain or cramps in your legs and feet.  Your legs or feet are numb.  Your feet always feel cold.  You have pain around a toenail. Get help right away if:  You have a wound, scrape, corn, or callus on your foot and: ? You have pain, swelling, or redness that gets worse. ? You have fluid or blood coming from the wound, scrape, corn, or callus. ? Your wound, scrape, corn, or callus feels warm to the touch. ? You have pus or a bad smell coming from the wound, scrape, corn, or callus. ? You have a fever. ? You have a red line going up your leg. Summary  Check your feet every day for cuts, sores, red spots, swelling, and blisters.   Moisturize feet and legs daily.  Wear shoes that fit properly and have enough cushioning.  If you have foot problems, report any cuts, sores, or bruises to your health care provider immediately.  Schedule a complete foot exam at least once a year (annually) or more often if you have foot problems. This information is not intended to replace advice given to you by your health care provider. Make sure you discuss any questions you have with your health care provider. Document Released: 11/26/2000 Document Revised: 01/11/2018 Document Reviewed: 12/31/2016 Elsevier Patient Education  2020 Elsevier Inc.   Onychomycosis/Fungal Toenails  WHAT IS IT? An infection that lies within the keratin of your nail plate that is caused by a fungus.  WHY ME? Fungal infections affect all ages, sexes, races, and creeds.  There may be many factors that predispose you to a fungal infection such as age, coexisting medical conditions such as diabetes, or an autoimmune disease; stress, medications, fatigue, genetics, etc.  Bottom line: fungus thrives in a warm, moist environment and your shoes offer such a location.  IS IT CONTAGIOUS? Theoretically, yes.  You do not want to share shoes, nail clippers or files with someone who has fungal toenails.  Walking around barefoot in the same room or sleeping in the same bed is unlikely to transfer the organism.  It is important to realize, however, that fungus can spread easily from one nail to the next on the same foot.  HOW DO WE TREAT THIS?  There are several ways to treat this condition.  Treatment may depend on many factors such as age, medications, pregnancy, liver and kidney conditions, etc.  It is best to ask your doctor which options are available to you.  1. No treatment.   Unlike many other medical concerns, you can live with this condition.  However for many people this can be a painful condition and may lead to ingrown toenails or a bacterial infection.  It is  recommended that you keep the nails cut short to help reduce the amount of fungal nail. 2. Topical treatment.  These range from herbal remedies to prescription strength nail lacquers.  About 40-50% effective, topicals require   daily application for approximately 9 to 12 months or until an entirely new nail has grown out.  The most effective topicals are medical grade medications available through physicians offices. 3. Oral antifungal medications.  With an 80-90% cure rate, the most common oral medication requires 3 to 4 months of therapy and stays in your system for a year as the new nail grows out.  Oral antifungal medications do require blood work to make sure it is a safe drug for you.  A liver function panel will be performed prior to starting the medication and after the first month of treatment.  It is important to have the blood work performed to avoid any harmful side effects.  In general, this medication safe but blood work is required. 4. Laser Therapy.  This treatment is performed by applying a specialized laser to the affected nail plate.  This therapy is noninvasive, fast, and non-painful.  It is not covered by insurance and is therefore, out of pocket.  The results have been very good with a 80-95% cure rate.  The [facility] is the only practice in the area to offer this therapy. 5. Permanent Nail Avulsion.  Removing the entire nail so that a new nail will not grow back.

## 2019-09-10 NOTE — Progress Notes (Signed)
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 Pain interferes with daily activities. Aggravating factor includes wearing enclosed shoe gear and relieved with periodic debridement.  Patient states her feet have started peeling in the past few weeks. She denies any redness, blisters, drainage or pain.   She is also requesting Rx for new inserts. She is on Medicaid.  Current Outpatient Medications on File Prior to Visit  Medication Sig  . ACCU-CHEK AVIVA PLUS test strip 3 (three) times daily. for testing  . ACCU-CHEK FASTCLIX LANCETS MISC TEST QID  . atenolol (TENORMIN) 25 MG tablet Take by mouth.  . clotrimazole (LOTRIMIN) 1 % cream Apply to affected area 2 times daily  . Dulaglutide (TRULICITY) 1.5 0000000 SOPN Inject into the skin.  Marland Kitchen exenatide (BYETTA 10 MCG PEN) 10 MCG/0.04ML SOPN injection Inject 10 mcg into the skin 2 (two) times daily with a meal.  . lisinopril-hydrochlorothiazide (PRINZIDE,ZESTORETIC) 20-12.5 MG tablet Take 1 tablet by mouth daily.  . metFORMIN (GLUCOPHAGE) 500 MG tablet TK 1 T PO BID  . PAZEO 0.7 % SOLN INSTILL 1 DROP OU ONCE IN THE MORNING  . phentermine 37.5 MG capsule Take by mouth.  . RESTASIS 0.05 % ophthalmic emulsion INSTILL 1 DROP OU BID  . simvastatin (ZOCOR) 40 MG tablet Take by mouth.  Marland Kitchen VICTOZA 18 MG/3ML SOPN INJECT 1.8 MG DAILY  . VOLTAREN 1 % GEL APPLY 2 GRAMS TO AFFECTED AREAS QID   No current facility-administered medications on file prior to visit.      Allergies  Allergen Reactions  . Iodine Anaphylaxis and Hives    Certain iodine in fish  . Shellfish Allergy Anaphylaxis    Iodine breaks her out and "stops her breathing"  . Aleve [Naproxen Sodium] Hives  . Iohexol Hives    Can possibly tolerate with Benadryl (but ask first??)   . Percocet [Oxycodone-Acetaminophen] Hives, Itching and Other (See Comments)    Causes  paranoia     Objective:  Vascular Examination: Capillary refill time <3 seconds x 10 digits.  Dorsalis pedis present b/l.  Posterior tibial pulses faintly palpable b/l.   Digital hair absent b/l.  Skin temperature gradient WNL b/l.   Dermatological Examination: Skin with normal turgor and tone b/l.  Moderate amount of diffuse scaling noted peripherally and plantarly b/l feet with mild foot odor.  No interdigital macerations.  No blisters, no weeping. No signs of secondary bacterial infection noted.  Toenails 1-5 b/l discolored, thick, dystrophic with subungual debris and pain with palpation to nailbeds due to thickness of nails.  Hyperkeratotic lesion distal tip left 3rd and b/l 2nd digits. No erythema, no edema, no drainage, no flocculence noted.  Musculoskeletal: Muscle strength 5/5 to all LE muscle groups.  Charcot foot LLE with no erythema, edema or warmth noted.  Hammertoes 2-5 left foot.   No pain, crepitus or joint limitation noted with ROM.   Neurological Examination: Protective sensation intact with 10 gram monofilament bilaterally.  Epicritic sensation present bilaterally.  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 Toenails 1-5 b/l were debrided in length and girth without iatrogenic bleeding. Corns b/l 2nd and left 3rd digit pared utilizing sterile scalpel blade without incident. Rx was sent for Lotrisone Cream to be applied to both feet and between  toes bid for 6 weeks. [name] will call Medicaid and let me know what Medicaid approved vendor to send her to for her new diabetic insoles. 2. Patient to continue soft, supportive shoe gear. 3. Patient to report any pedal injuries to medical professional immediately. 4. Follow up 3 months.  5. Patient/POA to call should there be a concern in the  interim.

## 2019-09-21 ENCOUNTER — Other Ambulatory Visit: Payer: Self-pay

## 2019-09-21 ENCOUNTER — Ambulatory Visit (HOSPITAL_COMMUNITY)
Admission: EM | Admit: 2019-09-21 | Discharge: 2019-09-21 | Disposition: A | Discharge status: Home / Self Care | Payer: Medicaid Other | Attending: Family Medicine | Admitting: Family Medicine

## 2019-09-21 ENCOUNTER — Encounter (HOSPITAL_COMMUNITY): Payer: Self-pay | Admitting: Emergency Medicine

## 2019-09-21 DIAGNOSIS — L03115 Cellulitis of right lower limb: Secondary | ICD-10-CM | POA: Diagnosis not present

## 2019-09-21 MED ORDER — IBUPROFEN 800 MG PO TABS
800.0000 mg | ORAL_TABLET | Freq: Once | ORAL | Status: AC
  Administered 2019-09-21: 14:00:00 800 mg via ORAL

## 2019-09-21 MED ORDER — IBUPROFEN 800 MG PO TABS
ORAL_TABLET | ORAL | Status: AC
  Filled 2019-09-21: qty 1

## 2019-09-21 MED ORDER — CEFTRIAXONE SODIUM 1 G IJ SOLR
INTRAMUSCULAR | Status: AC
  Filled 2019-09-21: qty 10

## 2019-09-21 MED ORDER — CEFTRIAXONE SODIUM 1 G IJ SOLR
1.0000 g | Freq: Once | INTRAMUSCULAR | Status: AC
  Administered 2019-09-21: 15:00:00 1 g via INTRAMUSCULAR

## 2019-09-21 MED ORDER — DOXYCYCLINE HYCLATE 100 MG PO TABS: 100.0000 mg | ORAL_TABLET | Freq: Two times a day (BID) | ORAL | 0 refills | Status: DC

## 2019-09-21 NOTE — Discharge Instructions (Addendum)
We need to look at your leg again on Sunday.  If you get worse in the meantime, please go to the emergency room for further evaluation

## 2019-09-21 NOTE — ED Triage Notes (Addendum)
Pt reports right lower, posterior leg pain that started on Wednesday.  She states she has redness and swelling in the area where it hurts and she states she has been having chills and a low grade fever. Pt took 625 mg Tylenol at 1230 today.  Pt tested for Covid on 09/04/2019 with negative result.

## 2019-09-21 NOTE — ED Provider Notes (Addendum)
[name]    CSN: [id number] Arrival date & time: 09/21/19  1319      History   Chief Complaint Chief Complaint  Patient presents with  . Leg Pain    right    HPI Kaitlyn Castillo is a 58 y.o. female.   Established patient   Pt reports right lower, posterior leg pain that started on Wednesday.  She states she has redness and swelling in the area where it hurts and she states she has been having chills and a low grade fever. Pt took 625 mg Tylenol at 1230 today.  Pt tested for Covid on 09/04/2019 with negative result.  Patient's had cellulitis in her leg before.  She was treated for an aggressive cellulitis 2 years ago.  Patient's blood sugars are running normal now.     Past Medical History:  Diagnosis Date  . Arthritis   . Cellulitis of left leg 07/07/2012  . Charcot's joint of foot   . Constipation   . Diabetes mellitus   . Head injury, closed, with brief LOC ([place])   . High cholesterol   . Hypertension   . Knee pain, right   . Obese   . Obesity (BMI 30-39.9) 12/08/2017  . Shortness of breath    with too much fluid- not currently    Patient Active Problem List   Diagnosis Date Noted  . Obesity (BMI 30-39.9) 12/08/2017  . Streptococcal bacteremia 12/06/2017  . Uncontrolled type 2 diabetes mellitus with hyperglycemia ([place]) 12/06/2017  . Hyponatremia 12/06/2017  . Postmenopausal bleeding 08/27/2015  . Adenomyosis 08/27/2015  . Skin ulcer of left foot including toes ([place]) 05/02/2015  . Equinus deformity of foot, acquired 04/02/2015  . Tenosynovitis of left foot 04/02/2015  . Arthritis of knee 08/13/2014  . Onychomycosis 11/02/2013  . Acquired deformity of toe 11/02/2013  . Unspecified venous (peripheral) insufficiency 09/13/2012  . Cellulitis of left leg 07/07/2012  . ANXIETY 11/19/2007  . DIABETES MELLITUS, TYPE II 07/29/2007  . DEPRESSION 07/29/2007  . Essential hypertension 07/29/2007    Past Surgical History:  Procedure Laterality  Date  . carbuncle     back of head  . CESAREAN SECTION    . COLONOSCOPY    . JOINT REPLACEMENT    . KNEE SURGERY     quad  . QUADRICEPS REPAIR    . TOTAL KNEE ARTHROPLASTY Left 08/13/2014   Procedure: TOTAL KNEE ARTHROPLASTY;  [place]: Meredith Pel, MD;  Location: [facility];  Service: Orthopedics;  Laterality: Left;    OB History    Gravida  8   Para  4   Term  4   Preterm      AB  4   Living  4     SAB      TAB  4   Ectopic      Multiple      Live Births  4            Home Medications    Prior to Admission medications   Medication Sig Start Date End Date Taking? Authorizing Provider  ACCU-CHEK AVIVA PLUS test strip 3 (three) times daily. for testing 10/03/18  Yes [provider]  ACCU-CHEK FASTCLIX LANCETS MISC TEST QID 09/02/18  Yes [provider]  acetaminophen (TYLENOL) 325 MG tablet Take 650 mg by mouth every 6 (six) hours as needed.   Yes [provider]  lisinopril-hydrochlorothiazide (PRINZIDE,ZESTORETIC) 20-12.5 MG tablet Take 1 tablet by mouth daily. 11/28/17  Yes [provider]  VICTOZA 18 MG/3ML SOPN INJECT 1.8 MG DAILY 04/10/19  Yes [provider]  atenolol (TENORMIN) 25 MG tablet Take by mouth.    [provider]  clotrimazole (LOTRIMIN) 1 % cream Apply to affected area 2 times daily 05/27/18   Augusto Gamble B, NP  clotrimazole-betamethasone (LOTRISONE) cream Apply to both feet and between toes bid x 6 weeks. 09/10/19   [name] Board, DPM  doxycycline (VIBRA-TABS) 100 MG tablet Take 1 tablet (100 mg total) by mouth 2 (two) times daily. 09/21/19   Robyn Haber, MD  Dulaglutide (TRULICITY) 1.5 0000000 SOPN Inject into the skin.    [provider]  exenatide (BYETTA 10 MCG PEN) 10 MCG/0.04ML SOPN injection Inject 10 mcg into the skin 2 (two) times daily with a meal.    [provider]  metFORMIN (GLUCOPHAGE) 500 MG tablet TK 1 T PO BID 03/18/19   [provider]  PAZEO 0.7 % SOLN INSTILL 1 DROP OU ONCE IN THE MORNING 04/03/19   [provider]  phentermine 37.5 MG capsule Take by mouth.    [provider]  RESTASIS 0.05 % ophthalmic emulsion INSTILL 1 DROP OU BID 04/03/19   [provider]  simvastatin (ZOCOR) 40 MG tablet Take by mouth.    [provider]  VOLTAREN 1 % GEL APPLY 2 GRAMS TO AFFECTED AREAS QID 09/02/18   [provider]    Family History Family History  Problem Relation Age of Onset  . Diabetes Mother   . Hyperlipidemia Mother   . Hypertension Mother   . Diabetes Father   . Hyperlipidemia Father   . Hypertension Father   . Cancer Brother   . Heart disease Brother   . Hypertension Brother   . Heart attack Brother     Social History Social History   Tobacco Use  . Smoking status: Former Smoker    Years: 0.00    Types: Cigarettes    Quit date: 12/13/1978    Years since quitting: 40.8  . Smokeless tobacco: Never Used  Substance Use Topics  . Alcohol use: No    Alcohol/week: 0.0 standard drinks  . Drug use: No     Allergies   Iodine, Shellfish allergy, Aleve [naproxen sodium], Iohexol, and Percocet [oxycodone-acetaminophen]   Review of Systems Review of Systems   Physical Exam Triage Vital Signs ED Triage Vitals  Enc Vitals Group     BP 09/21/19 1340 102/66     Pulse Rate 09/21/19 1340 99     Resp 09/21/19 1340 18     Temp 09/21/19 1340 (!) 100.5 F (38.1 C)     Temp Source 09/21/19 1340 Oral     SpO2 09/21/19 1340 100 %     Weight --      Height --      Head Circumference --      Peak Flow --      Pain Score 09/21/19 1339 8     Pain Loc --      Pain Edu? --      Excl. in [place]? --    No data found.  Updated Vital Signs BP 102/66 (BP Location: Right Arm)   Pulse 99   Temp (!) 100.5 F (38.1 C) (Oral)   Resp 18   LMP 08/22/2015   SpO2 100%    Physical Exam Vitals signs and nursing note reviewed.  Constitutional:      Appearance: Normal  appearance. She is obese.  HENT:     Head: Normocephalic.     Mouth/Throat:     Pharynx: Oropharynx is clear.  Eyes:     Conjunctiva/sclera: Conjunctivae normal.  Neck:     Musculoskeletal: Normal range of motion and neck supple.  Cardiovascular:     Rate and Rhythm: Normal rate.     Heart sounds: Normal heart sounds.  Pulmonary:     Effort: Pulmonary effort is normal.     Breath sounds: Normal breath sounds.  Musculoskeletal: Normal range of motion.  Skin:    General: Skin is warm and dry.     Findings: Erythema present.     Comments: Erythematous, tender and swollen right Achilles area  Neurological:     General: No focal deficit present.     Mental Status: She is alert and oriented to person, place, and time.  Psychiatric:        Mood and Affect: Mood normal.      UC Treatments / Results  Labs (all labs ordered are listed, but only abnormal results are displayed) Labs Reviewed - No data to display  EKG   Radiology No results found.  Procedures Procedures (including critical care time)  Medications Ordered in UC Medications  cefTRIAXone (ROCEPHIN) injection 1 g (has no administration in time range)  ibuprofen (ADVIL) tablet 800 mg (800 mg Oral Given 09/21/19 1406)  ibuprofen (ADVIL) 800 MG tablet (has no administration in time range)  cefTRIAXone (ROCEPHIN) 1 g injection (has no administration in time range)    Initial Impression / Assessment and Plan / UC Course  I have reviewed the triage vital signs and the nursing notes.  Pertinent labs & imaging results that were available during my care of the patient were reviewed by me and considered in my medical decision making (see chart for details).    Final Clinical Impressions(s) / UC Diagnoses   Final diagnoses:  Cellulitis of right lower extremity     Discharge Instructions     We need to look at your leg again on Sunday.  If you get worse in the meantime, please go to the emergency room for further  evaluation    ED Prescriptions    Medication Sig Dispense Auth. Provider   doxycycline (VIBRA-TABS) 100 MG tablet Take 1 tablet (100 mg total) by mouth 2 (two) times daily. 20 tablet Robyn Haber, MD     I have reviewed the PDMP during this encounter.   Robyn Haber, MD 09/21/19 1414    Robyn Haber, MD 09/21/19 1423

## 2019-09-23 ENCOUNTER — Encounter (HOSPITAL_COMMUNITY): Payer: Self-pay

## 2019-09-23 ENCOUNTER — Other Ambulatory Visit: Payer: Self-pay

## 2019-09-23 ENCOUNTER — Ambulatory Visit (HOSPITAL_COMMUNITY)
Admission: EM | Admit: 2019-09-23 | Discharge: 2019-09-23 | Disposition: A | Discharge status: Home / Self Care | Payer: Medicaid Other | Attending: Family Medicine | Admitting: Family Medicine

## 2019-09-23 DIAGNOSIS — L03115 Cellulitis of right lower limb: Secondary | ICD-10-CM

## 2019-09-23 MED ORDER — DICLOFENAC SODIUM 1 % TD GEL: 2.0000 g | Freq: Four times a day (QID) | TRANSDERMAL | 0 refills | Status: DC

## 2019-09-23 NOTE — Discharge Instructions (Addendum)
Take all of your antibiotics Be sure to take them with food Warm compresses to area may help Use Voltaren gel for pain Call your PCP tomorrow for follow-up in a few days

## 2019-09-23 NOTE — ED Provider Notes (Signed)
[facility]    CSN: [id number] Arrival date & time: 09/23/19  1314      History   Chief Complaint Chief Complaint  Patient presents with  . Follow-up  . Cellulitis    HPI Kaitlyn Castillo is a 58 y.o. female.   HPI  Patient is here for follow-up of cellulitis.  She was seen by Dr. Joseph Art a couple days ago.  She received a gram of Rocephin IM.  She also is on doxycycline 100 mg twice daily.  She is using Voltaren gel for pain.  She states that this does help.  She has limited her walking and weightbearing.  The pain is improving.  The swelling is going down.  She thinks she is improving.  She has had no fever or chills  Past Medical History:  Diagnosis Date  . Arthritis   . Cellulitis of left leg 07/07/2012  . Charcot's joint of foot   . Constipation   . Diabetes mellitus   . Head injury, closed, with brief LOC ([place])   . High cholesterol   . Hypertension   . Knee pain, right   . Obese   . Obesity (BMI 30-39.9) 12/08/2017  . Shortness of breath    with too much fluid- not currently    Patient Active Problem List   Diagnosis Date Noted  . Obesity (BMI 30-39.9) 12/08/2017  . Streptococcal bacteremia 12/06/2017  . Uncontrolled type 2 diabetes mellitus with hyperglycemia ([place]) 12/06/2017  . Hyponatremia 12/06/2017  . Postmenopausal bleeding 08/27/2015  . Adenomyosis 08/27/2015  . Skin ulcer of left foot including toes ([place]) 05/02/2015  . Equinus deformity of foot, acquired 04/02/2015  . Tenosynovitis of left foot 04/02/2015  . Arthritis of knee 08/13/2014  . Onychomycosis 11/02/2013  . Acquired deformity of toe 11/02/2013  . Unspecified venous (peripheral) insufficiency 09/13/2012  . Cellulitis of left leg 07/07/2012  . ANXIETY 11/19/2007  . DIABETES MELLITUS, TYPE II 07/29/2007  . DEPRESSION 07/29/2007  . Essential hypertension 07/29/2007    Past Surgical History:  Procedure Laterality Date  . carbuncle     back of head  . CESAREAN SECTION     . COLONOSCOPY    . JOINT REPLACEMENT    . KNEE SURGERY     quad  . QUADRICEPS REPAIR    . TOTAL KNEE ARTHROPLASTY Left 08/13/2014   Procedure: TOTAL KNEE ARTHROPLASTY;  Diebel: Meredith Pel, MD;  Location: [place];  Service: Orthopedics;  Laterality: Left;    OB History    Gravida  8   Para  4   Term  4   Preterm      AB  4   Living  4     SAB      TAB  4   Ectopic      Multiple      Live Births  4            Home Medications    Prior to Admission medications   Medication Sig Start Date End Date Taking? Authorizing Provider  ACCU-CHEK AVIVA PLUS test strip 3 (three) times daily. for testing 10/03/18   [provider]  ACCU-CHEK FASTCLIX LANCETS MISC TEST QID 09/02/18   [provider]  acetaminophen (TYLENOL) 325 MG tablet Take 650 mg by mouth every 6 (six) hours as needed.    [provider]  atenolol (TENORMIN) 25 MG tablet Take by mouth.    [provider]  diclofenac sodium (VOLTAREN) 1 % GEL  Apply 2 g topically 4 (four) times daily. 09/23/19   Raylene Everts, MD  doxycycline (VIBRA-TABS) 100 MG tablet Take 1 tablet (100 mg total) by mouth 2 (two) times daily. 09/21/19   Robyn Haber, MD  Dulaglutide (TRULICITY) 1.5 0000000 SOPN Inject into the skin.    [provider]  exenatide (BYETTA 10 MCG PEN) 10 MCG/0.04ML SOPN injection Inject 10 mcg into the skin 2 (two) times daily with a meal.    [provider]  lisinopril-hydrochlorothiazide (PRINZIDE,ZESTORETIC) 20-12.5 MG tablet Take 1 tablet by mouth daily. 11/28/17   [provider]  metFORMIN (GLUCOPHAGE) 500 MG tablet TK 1 T PO BID 03/18/19   [provider]  RESTASIS 0.05 % ophthalmic emulsion INSTILL 1 DROP OU BID 04/03/19   [provider]  simvastatin (ZOCOR) 40 MG tablet Take by mouth.    [provider]  VICTOZA 18 MG/3ML SOPN INJECT 1.8 MG DAILY 04/10/19   [provider]    Family History  Family History  Problem Relation Age of Onset  . Diabetes Mother   . Hyperlipidemia Mother   . Hypertension Mother   . Diabetes Father   . Hyperlipidemia Father   . Hypertension Father   . Cancer Brother   . Heart disease Brother   . Hypertension Brother   . Heart attack Brother     Social History Social History   Tobacco Use  . Smoking status: Former Smoker    Years: 0.00    Types: Cigarettes    Quit date: 12/13/1978    Years since quitting: 40.8  . Smokeless tobacco: Never Used  Substance Use Topics  . Alcohol use: No    Alcohol/week: 0.0 standard drinks  . Drug use: No     Allergies   Iodine, Shellfish allergy, Aleve [naproxen sodium], Iohexol, and Percocet [oxycodone-acetaminophen]   Review of Systems Review of Systems  Constitutional: Negative for chills and fever.  HENT: Negative for ear pain and sore throat.   Eyes: Negative for pain and visual disturbance.  Respiratory: Negative for cough and shortness of breath.   Cardiovascular: Negative for chest pain and palpitations.  Gastrointestinal: Negative for abdominal pain and vomiting.  Genitourinary: Negative for dysuria and hematuria.  Musculoskeletal: Positive for gait problem. Negative for arthralgias and back pain.  Skin: Positive for color change. Negative for rash.  Neurological: Negative for seizures and syncope.  All other systems reviewed and are negative.    Physical Exam Triage Vital Signs ED Triage Vitals  Enc Vitals Group     BP 09/23/19 1331 129/64     Pulse Rate 09/23/19 1331 80     Resp 09/23/19 1331 17     Temp 09/23/19 1331 98.3 F (36.8 C)     Temp Source 09/23/19 1331 Oral     SpO2 09/23/19 1331 99 %     Weight --      Height --      Head Circumference --      Peak Flow --      Pain Score 09/23/19 1327 5     Pain Loc --      Pain Edu? --      Excl. in [place]? --    No data found.  Updated Vital Signs BP 129/64 (BP Location: Left Arm)   Pulse 80   Temp 98.3 F (36.8 C)  (Oral)   Resp 17   LMP 08/22/2015   SpO2 99%   Visual Acuity Right Eye Distance:  Left Eye Distance:   Bilateral Distance:    Right Eye Near:   Left Eye Near:    Bilateral Near:     Physical Exam Constitutional:      General: She is not in acute distress.    Appearance: She is well-developed. She is obese.     Comments: Obese.  Mildly antalgic gait.  No acute distress.  Very talkative  HENT:     Head: Normocephalic and atraumatic.  Eyes:     Conjunctiva/sclera: Conjunctivae normal.     Pupils: Pupils are equal, round, and reactive to light.  Neck:     Musculoskeletal: Normal range of motion.  Cardiovascular:     Rate and Rhythm: Normal rate and regular rhythm.     Heart sounds: Normal heart sounds.  Pulmonary:     Effort: Pulmonary effort is normal. No respiratory distress.     Breath sounds: Normal breath sounds.  Abdominal:     General: There is no distension.     Palpations: Abdomen is soft.  Musculoskeletal: Normal range of motion.  Skin:    General: Skin is warm and dry.     Comments: Left ankle is swollen greater than the right ankle.  The right ankle has mild erythema and tenderness over the Achilles tendon.  No warmth.  She has good range of motion of the ankle.  Stretching of the Achilles tendon, general exam is recommended.  Neurological:     Mental Status: She is alert.      UC Treatments / Results  Labs (all labs ordered are listed, but only abnormal results are displayed) Labs Reviewed - No data to display  EKG   Radiology No results found.  Procedures Procedures (including critical care time)  Medications Ordered in UC Medications - No data to display  Initial Impression / Assessment and Plan / UC Course  I have reviewed the triage vital signs and the nursing notes.  Pertinent labs & imaging results that were available during my care of the patient were reviewed by me and considered in my medical decision making (see chart for details).      Cellulitis is improving Final Clinical Impressions(s) / UC Diagnoses   Final diagnoses:  Cellulitis of leg, right     Discharge Instructions     Take all of your antibiotics Be sure to take them with food Warm compresses to area may help Use Voltaren gel for pain Call your PCP tomorrow for follow-up in a few days   ED Prescriptions    Medication Sig Dispense Auth. Provider   diclofenac sodium (VOLTAREN) 1 % GEL Apply 2 g topically 4 (four) times daily. 100 g Raylene Everts, MD     PDMP not reviewed this encounter.   Raylene Everts, MD 09/23/19 [phone]

## 2019-09-23 NOTE — ED Triage Notes (Signed)
Pt present to the UC today for follow up for the cellulitis in her lower right leg. Pt was seen at this location 2 days ago.

## 2019-10-30 ENCOUNTER — Other Ambulatory Visit: Payer: Self-pay

## 2019-10-30 DIAGNOSIS — Z20822 Contact with and (suspected) exposure to covid-19: Secondary | ICD-10-CM

## 2019-10-31 LAB — NOVEL CORONAVIRUS, NAA: SARS-CoV-2, NAA: NOT DETECTED

## 2019-12-04 ENCOUNTER — Encounter: Payer: Self-pay | Admitting: Podiatry

## 2019-12-04 ENCOUNTER — Other Ambulatory Visit: Payer: Self-pay

## 2019-12-04 ENCOUNTER — Ambulatory Visit: Payer: Medicaid Other | Admitting: Podiatry

## 2019-12-04 DIAGNOSIS — M79675 Pain in left toe(s): Secondary | ICD-10-CM | POA: Diagnosis not present

## 2019-12-04 DIAGNOSIS — M79674 Pain in right toe(s): Secondary | ICD-10-CM

## 2019-12-04 DIAGNOSIS — B351 Tinea unguium: Secondary | ICD-10-CM | POA: Diagnosis not present

## 2019-12-04 DIAGNOSIS — E08610 Diabetes mellitus due to underlying condition with diabetic neuropathic arthropathy: Secondary | ICD-10-CM

## 2019-12-04 NOTE — Patient Instructions (Signed)
Diabetes Mellitus and Foot Care Foot care is an important part of your health, especially when you have diabetes. Diabetes may cause you to have problems because of poor blood flow (circulation) to your feet and legs, which can cause your skin to:  Become thinner and drier.  Break more easily.  Heal more slowly.  Peel and crack. You may also have nerve damage (neuropathy) in your legs and feet, causing decreased feeling in them. This means that you may not notice minor injuries to your feet that could lead to more serious problems. Noticing and addressing any potential problems early is the best way to prevent future foot problems. How to care for your feet Foot hygiene  Wash your feet daily with warm water and mild soap. Do not use hot water. Then, pat your feet and the areas between your toes until they are completely dry. Do not soak your feet as this can dry your skin.  Trim your toenails straight across. Do not dig under them or around the cuticle. File the edges of your nails with an emery board or nail file.  Apply a moisturizing lotion or petroleum jelly to the skin on your feet and to dry, brittle toenails. Use lotion that does not contain alcohol and is unscented. Do not apply lotion between your toes. Shoes and socks  Wear clean socks or stockings every day. Make sure they are not too tight. Do not wear knee-high stockings since they may decrease blood flow to your legs.  Wear shoes that fit properly and have enough cushioning. Always look in your shoes before you put them on to be sure there are no objects inside.  To break in new shoes, wear them for just a few hours a day. This prevents injuries on your feet. Wounds, scrapes, corns, and calluses  Check your feet daily for blisters, cuts, bruises, sores, and redness. If you cannot see the bottom of your feet, use a mirror or ask someone for help.  Do not cut corns or calluses or try to remove them with medicine.  If you  find a minor scrape, cut, or break in the skin on your feet, keep it and the skin around it clean and dry. You may clean these areas with mild soap and water. Do not clean the area with peroxide, alcohol, or iodine.  If you have a wound, scrape, corn, or callus on your foot, look at it several times a day to make sure it is healing and not infected. Check for: ? Redness, swelling, or pain. ? Fluid or blood. ? Warmth. ? Pus or a bad smell. General instructions  Do not cross your legs. This may decrease blood flow to your feet.  Do not use heating pads or hot water bottles on your feet. They may burn your skin. If you have lost feeling in your feet or legs, you may not know this is happening until it is too late.  Protect your feet from hot and cold by wearing shoes, such as at the beach or on hot pavement.  Schedule a complete foot exam at least once a year (annually) or more often if you have foot problems. If you have foot problems, report any cuts, sores, or bruises to your health care provider immediately. Contact a health care provider if:  You have a medical condition that increases your risk of infection and you have any cuts, sores, or bruises on your feet.  You have an injury that is not   healing.  You have redness on your legs or feet.  You feel burning or tingling in your legs or feet.  You have pain or cramps in your legs and feet.  Your legs or feet are numb.  Your feet always feel cold.  You have pain around a toenail. Get help right away if:  You have a wound, scrape, corn, or callus on your foot and: ? You have pain, swelling, or redness that gets worse. ? You have fluid or blood coming from the wound, scrape, corn, or callus. ? Your wound, scrape, corn, or callus feels warm to the touch. ? You have pus or a bad smell coming from the wound, scrape, corn, or callus. ? You have a fever. ? You have a red line going up your leg. Summary  Check your feet every day  for cuts, sores, red spots, swelling, and blisters.  Moisturize feet and legs daily.  Wear shoes that fit properly and have enough cushioning.  If you have foot problems, report any cuts, sores, or bruises to your health care provider immediately.  Schedule a complete foot exam at least once a year (annually) or more often if you have foot problems. This information is not intended to replace advice given to you by your health care provider. Make sure you discuss any questions you have with your health care provider. Document Released: 11/26/2000 Document Revised: 01/11/2018 Document Reviewed: 12/31/2016 Elsevier Patient Education  2020 [employer]  

## 2019-12-10 NOTE — Progress Notes (Signed)
Subjective: Kaitlyn Castillo is a 58 y.o. y.o. female with h/o diabetes who presents today for preventative diabetic foot care. Patient has painful, elongated mycotic toenails b/l and corns b/l feet which pose a risk and interfere with daily activities. Pain is aggravated when wearing enclosed shoe gear and relieved with periodic professional debridement.  Benito Mccreedy, MD is patient's PCP.   Medications reviewed in chart.  Allergies  Allergen Reactions  . Iodine Anaphylaxis and Hives    Certain iodine in fish  . Shellfish Allergy Anaphylaxis    Iodine breaks her out and "stops her breathing"  . Aleve [Naproxen Sodium] Hives  . Iohexol Hives    Can possibly tolerate with Benadryl (but ask first??)   . Percocet [Oxycodone-Acetaminophen] Hives, Itching and Other (See Comments)    Causes paranoia   Objective: There were no vitals filed for this visit.  Vascular Examination: Capillary refill time to digits immediate b/l.  Dorsalis pedis pulses palpable b/l.  Posterior tibial pulses faintly palpable b/l.  Digital hair absent b/l.  Skin temperature gradient WNL b/l.  Dermatological Examination: Skin with normal turgor, texture and tone b/l.  Toenails 1-5 b/l discolored, thick, dystrophic with subungual debris and pain with palpation to nailbeds due to thickness of nails.  Hyperkeratotic lesions distal tip b/l 2nd digit and left 3rd digit. No erythema, no edema, no drainage, no flocculence noted.   Tinea resolved b/l feet.  Musculoskeletal: Muscle strength 5/5 to all LE muscle groups b/l.  Charcot foot deformity left LE.   Hammertoes 2-5 left foot  Neurological: Sensation intact 5/5 b/l with 10 gram monofilament.  Vibratory sensation decreased b/l.   Assessment: 1. Painful onychomycosis toenails 1-5 b/l 2.   Corns b/l 2nd and left 3rd digits 3.   Charcot Foot left LE, stable 4.   NIDDM with neuroarthropathy 5.  Tinea pedis resolved b/l  Plan: 1. Continue  diabetic foot care principles. Literature dispensed on today. 2. Toenails 1-5 b/l were debrided in length and girth without iatrogenic bleeding. 3. As a courtesy, hyperkeratotic lesion(s) pared b/l 2nd and left 3rd digit with sterile scalpel blade without incident. Dispensed toe tunnels for left 3rd and right 2nd digits for daily protection. Apply every morning. Remove every evening. 4. Patient to continue soft, supportive shoe gear daily. 5.  atient to report any pedal injuries to medical professional immediately. 6. Follow up 3 months.  7. Patient/POA to call should there be a concern in the interim.

## 2020-01-18 ENCOUNTER — Other Ambulatory Visit: Payer: Self-pay | Admitting: Internal Medicine

## 2020-01-18 DIAGNOSIS — Z1231 Encounter for screening mammogram for malignant neoplasm of breast: Secondary | ICD-10-CM

## 2020-02-27 ENCOUNTER — Other Ambulatory Visit: Payer: Self-pay

## 2020-02-27 ENCOUNTER — Ambulatory Visit
Admission: RE | Admit: 2020-02-27 | Discharge: 2020-02-27 | Disposition: A | Discharge status: Home / Self Care | Payer: Medicaid Other | Source: Ambulatory Visit | Attending: Internal Medicine | Admitting: Internal Medicine

## 2020-02-27 DIAGNOSIS — Z1231 Encounter for screening mammogram for malignant neoplasm of breast: Secondary | ICD-10-CM

## 2020-02-28 DIAGNOSIS — E6609 Other obesity due to excess calories: Secondary | ICD-10-CM | POA: Insufficient documentation

## 2020-03-04 ENCOUNTER — Other Ambulatory Visit: Payer: Self-pay

## 2020-03-04 ENCOUNTER — Ambulatory Visit: Payer: Medicaid Other | Admitting: Podiatry

## 2020-03-04 ENCOUNTER — Encounter: Payer: Self-pay | Admitting: Podiatry

## 2020-03-04 DIAGNOSIS — B351 Tinea unguium: Secondary | ICD-10-CM | POA: Diagnosis not present

## 2020-03-04 DIAGNOSIS — M79674 Pain in right toe(s): Secondary | ICD-10-CM

## 2020-03-04 DIAGNOSIS — L600 Ingrowing nail: Secondary | ICD-10-CM

## 2020-03-04 DIAGNOSIS — M79675 Pain in left toe(s): Secondary | ICD-10-CM

## 2020-03-04 DIAGNOSIS — E1151 Type 2 diabetes mellitus with diabetic peripheral angiopathy without gangrene: Secondary | ICD-10-CM

## 2020-03-04 DIAGNOSIS — L84 Corns and callosities: Secondary | ICD-10-CM

## 2020-03-04 NOTE — Patient Instructions (Addendum)
EPSOM SALT FOOT SOAK INSTRUCTIONS  Shopping List:  A. Plain epsom salt (not scented) B. Neosporin Cream/Ointment or Bacitracin Cream/Ointment C. 1-inch fabric band-aids   1.  Place 1/4 cup of epsom salts in 2 quarts of warm tap water. IF YOU ARE DIABETIC, OR HAVE NEUROPATHY, CHECK THE TEMPERATURE OF THE WATER WITH YOUR ELBOW.  2.  Submerge your foot/feet in the solution and soak for 10-15 minutes.      3.  Next, remove your foot or feet from solution, blot dry the affected area.    4.  Apply antibiotic ointment and cover with fabric band-aid .  5.  This soak should be done once a day for 7 days.   6.  Monitor for any signs/symptoms of infection such as redness, swelling, odor, drainage, increased pain, or non-healing of digit.   7.  Please do not hesitate to call the office and speak to a Nurse or Doctor if you have questions.   8.  If you experience fever, chills, nightsweats, nausea or vomiting with worsening of digit, please go to the emergency room.   Diabetes Mellitus and Foot Care Foot care is an important part of your health, especially when you have diabetes. Diabetes may cause you to have problems because of poor blood flow (circulation) to your feet and legs, which can cause your skin to:  Become thinner and drier.  Break more easily.  Heal more slowly.  Peel and crack. You may also have nerve damage (neuropathy) in your legs and feet, causing decreased feeling in them. This means that you may not notice minor injuries to your feet that could lead to more serious problems. Noticing and addressing any potential problems early is the best way to prevent future foot problems. How to care for your feet Foot hygiene  Wash your feet daily with warm water and mild soap. Do not use hot water. Then, pat your feet and the areas between your toes until they are completely dry. Do not soak your feet as this can dry your skin.  Trim your toenails straight across. Do not dig  under them or around the cuticle. File the edges of your nails with an emery board or nail file.  Apply a moisturizing lotion or petroleum jelly to the skin on your feet and to dry, brittle toenails. Use lotion that does not contain alcohol and is unscented. Do not apply lotion between your toes. Shoes and socks  Wear clean socks or stockings every day. Make sure they are not too tight. Do not wear knee-high stockings since they may decrease blood flow to your legs.  Wear shoes that fit properly and have enough cushioning. Always look in your shoes before you put them on to be sure there are no objects inside.  To break in new shoes, wear them for just a few hours a day. This prevents injuries on your feet. Wounds, scrapes, corns, and calluses  Check your feet daily for blisters, cuts, bruises, sores, and redness. If you cannot see the bottom of your feet, use a mirror or ask someone for help.  Do not cut corns or calluses or try to remove them with medicine.  If you find a minor scrape, cut, or break in the skin on your feet, keep it and the skin around it clean and dry. You may clean these areas with mild soap and water. Do not clean the area with peroxide, alcohol, or iodine.  If you have a wound, scrape, corn,  or callus on your foot, look at it several times a day to make sure it is healing and not infected. Check for: ? Redness, swelling, or pain. ? Fluid or blood. ? Warmth. ? Pus or a bad smell. General instructions  Do not cross your legs. This may decrease blood flow to your feet.  Do not use heating pads or hot water bottles on your feet. They may burn your skin. If you have lost feeling in your feet or legs, you may not know this is happening until it is too late.  Protect your feet from hot and cold by wearing shoes, such as at the beach or on hot pavement.  Schedule a complete foot exam at least once a year (annually) or more often if you have foot problems. If you have  foot problems, report any cuts, sores, or bruises to your health care provider immediately. Contact a health care provider if:  You have a medical condition that increases your risk of infection and you have any cuts, sores, or bruises on your feet.  You have an injury that is not healing.  You have redness on your legs or feet.  You feel burning or tingling in your legs or feet.  You have pain or cramps in your legs and feet.  Your legs or feet are numb.  Your feet always feel cold.  You have pain around a toenail. Get help right away if:  You have a wound, scrape, corn, or callus on your foot and: ? You have pain, swelling, or redness that gets worse. ? You have fluid or blood coming from the wound, scrape, corn, or callus. ? Your wound, scrape, corn, or callus feels warm to the touch. ? You have pus or a bad smell coming from the wound, scrape, corn, or callus. ? You have a fever. ? You have a red line going up your leg. Summary  Check your feet every day for cuts, sores, red spots, swelling, and blisters.  Moisturize feet and legs daily.  Wear shoes that fit properly and have enough cushioning.  If you have foot problems, report any cuts, sores, or bruises to your health care provider immediately.  Schedule a complete foot exam at least once a year (annually) or more often if you have foot problems. This information is not intended to replace advice given to you by your health care provider. Make sure you discuss any questions you have with your health care provider. Document Revised: 08/22/2019 Document Reviewed: 12/31/2016 Elsevier Patient Education  [place].  Peripheral Neuropathy Peripheral neuropathy is a type of nerve damage. It affects nerves that carry signals between the spinal cord and the arms, legs, and the rest of the body (peripheral nerves). It does not affect nerves in the spinal cord or brain. In peripheral neuropathy, one nerve or a group of  nerves may be damaged. Peripheral neuropathy is a broad category that includes many specific nerve disorders, like diabetic neuropathy, hereditary neuropathy, and carpal tunnel syndrome. What are the causes? This condition may be caused by:  Diabetes. This is the most common cause of peripheral neuropathy.  Nerve injury.  Pressure or stress on a nerve that lasts a long time.  Lack (deficiency) of B vitamins. This can result from alcoholism, poor diet, or a restricted diet.  Infections.  Autoimmune diseases, such as rheumatoid arthritis and systemic lupus erythematosus.  Nerve diseases that are passed from parent to child (inherited).  Some medicines, such as  cancer medicines (chemotherapy).  Poisonous (toxic) substances, such as lead and mercury.  Too little blood flowing to the legs.  Kidney disease.  Thyroid disease. In some cases, the cause of this condition is not known. What are the signs or symptoms? Symptoms of this condition depend on which of your nerves is damaged. Common symptoms include:  Loss of feeling (numbness) in the feet, hands, or both.  Tingling in the feet, hands, or both.  Burning pain.  Very sensitive skin.  Weakness.  Not being able to move a part of the body (paralysis).  Muscle twitching.  Clumsiness or poor coordination.  Loss of balance.  Not being able to control your bladder.  Feeling dizzy.  Sexual problems. How is this diagnosed? Diagnosing and finding the cause of peripheral neuropathy can be difficult. Your health care provider will take your medical history and do a physical exam. A neurological exam will also be done. This involves checking things that are affected by your brain, spinal cord, and nerves (nervous system). For example, your health care provider will check your reflexes, how you move, and what you can feel. You may have other tests, such as:  Blood tests.  Electromyogram (EMG) and nerve conduction tests.  These tests check nerve function and how well the nerves are controlling the muscles.  Imaging tests, such as CT scans or MRI to rule out other causes of your symptoms.  Removing a small piece of nerve to be examined in a lab (nerve biopsy). This is rare.  Removing and examining a small amount of the fluid that surrounds the brain and spinal cord (lumbar puncture). This is rare. How is this treated? Treatment for this condition may involve:  Treating the underlying cause of the neuropathy, such as diabetes, kidney disease, or vitamin deficiencies.  Stopping medicines that can cause neuropathy, such as chemotherapy.  Medicine to relieve pain. Medicines may include: ? Prescription or over-the-counter pain medicine. ? Antiseizure medicine. ? Antidepressants. ? Pain-relieving patches that are applied to painful areas of skin.  Surgery to relieve pressure on a nerve or to destroy a nerve that is causing pain.  Physical therapy to help improve movement and balance.  Devices to help you move around (assistive devices). Follow these instructions at home: Medicines  Take over-the-counter and prescription medicines only as told by your health care provider. Do not take any other medicines without first asking your health care provider.  Do not drive or use heavy machinery while taking prescription pain medicine. Lifestyle   Do not use any products that contain nicotine or tobacco, such as cigarettes and e-cigarettes. Smoking keeps blood from reaching damaged nerves. If you need help quitting, ask your health care provider.  Avoid or limit alcohol. Too much alcohol can cause a vitamin B deficiency, and vitamin B is needed for healthy nerves.  Eat a healthy diet. This includes: ? Eating foods that are high in fiber, such as fresh fruits and vegetables, whole grains, and beans. ? Limiting foods that are high in fat and processed sugars, such as fried or sweet foods. General instructions    If you have diabetes, work closely with your health care provider to keep your blood sugar under control.  If you have numbness in your feet: ? Check every day for signs of injury or infection. Watch for redness, warmth, and swelling. ? Wear padded socks and comfortable shoes. These help protect your feet.  Develop a good support system. Living with peripheral neuropathy can be  stressful. Consider talking with a mental health specialist or joining a support group.  Use assistive devices and attend physical therapy as told by your health care provider. This may include using a walker or a cane.  Keep all follow-up visits as told by your health care provider. This is important. Contact a health care provider if:  You have new signs or symptoms of peripheral neuropathy.  You are struggling emotionally from dealing with peripheral neuropathy.  Your pain is not well-controlled. Get help right away if:  You have an injury or infection that is not healing normally.  You develop new weakness in an arm or leg.  You fall frequently. Summary  Peripheral neuropathy is when the nerves in the arms, or legs are damaged, resulting in numbness, weakness, or pain.  There are many causes of peripheral neuropathy, including diabetes, pinched nerves, vitamin deficiencies, autoimmune disease, and hereditary conditions.  Diagnosing and finding the cause of peripheral neuropathy can be difficult. Your health care provider will take your medical history, do a physical exam, and do tests, including blood tests and nerve function tests.  Treatment involves treating the underlying cause of the neuropathy and taking medicines to help control pain. Physical therapy and assistive devices may also help. This information is not intended to replace advice given to you by your health care provider. Make sure you discuss any questions you have with your health care provider. Document Revised: 11/11/2017 Document  Reviewed: 02/07/2017 Elsevier Patient Education  2020 [employer].

## 2020-03-06 NOTE — Progress Notes (Signed)
Subjective: Kaitlyn Castillo presents today for follow up of at risk foot care. Patient has history of diabetes, Charcot joint of foot and h/o ulcer left foot.   Allergies  Allergen Reactions  . Iodine Anaphylaxis and Hives    Certain iodine in fish  . Shellfish Allergy Anaphylaxis    Iodine breaks her out and "stops her breathing"  . Aleve [Naproxen Sodium] Hives  . Iohexol Hives    Can possibly tolerate with Benadryl (but ask first??)   . Percocet [Oxycodone-Acetaminophen] Hives, Itching and Other (See Comments)    Causes paranoia    Objective: There were no vitals filed for this visit.  Pt 59 y.o. year old AA female in NAD. AAO x 3.   Vascular Examination:  Capillary refill time to digits immediate b/l. Palpable DP pulses b/l. Nonpalpable PT pulses b/l. Pedal hair absent b/l Skin temperature gradient within normal limits b/l.  Dermatological Examination: Pedal skin with normal turgor, texture and tone bilaterally. No open wounds bilaterally. No interdigital macerations bilaterally. Toenails 1-5 b/l elongated, dystrophic, thickened, crumbly with subungual debris and tenderness to dorsal palpation. Hyperkeratotic lesion(s) L 2nd toe, L 3rd toe and R 2nd toe.  No erythema, no edema, no drainage, no flocculence.  Musculoskeletal: Normal muscle strength 5/5 to all lower extremity muscle groups bilaterally, no gross bony deformities bilaterally, no pain crepitus or joint limitation noted with ROM b/l, hammertoes noted to the  L hallux, L 2nd toe, L 3rd toe, L 4th toe and L 5th toe and Charcot deformity left foot  Neurological: Protective sensation intact 5/5 intact bilaterally with 10g monofilament b/l Vibratory sensation decreased b/l  Assessment: 1. Pain due to onychomycosis of toenails of both feet   2. Corns   3. Ingrown toenail without infection   4. Type II diabetes mellitus with peripheral circulatory disorder (HCC)    Plan: -Continue diabetic foot care principles.  Literature dispensed on today.  -Toenails 1-5 b/l were debrided in length and girth with sterile nail nippers and dremel without iatrogenic bleeding.  -Corn(s) debrided L 2nd toe, L 3rd toe and R 2nd toe without complication or incident. Total number debrided=3. -Patient to continue soft, supportive shoe gear daily. -Patient to report any pedal injuries to medical professional immediately. -Offending nail border debrided and curretaged L hallux and R hallux. Border(s) cleansed with alcohol and triple antibiotic ointment applied. Patient instructed to apply Neosporin Cream to L hallux and R hallux once daily for 7 days. -Dispensed written instructions for once daily epsom salt soaks for 7 days for ingrown toenail. of L hallux and R hallux.  -Patient/POA to call should there be question/concern in the interim.  Return in about 2 weeks (around 03/18/2020) for right nail check, left nail check.

## 2020-03-18 ENCOUNTER — Ambulatory Visit (INDEPENDENT_AMBULATORY_CARE_PROVIDER_SITE_OTHER): Payer: Medicaid Other | Admitting: Podiatry

## 2020-03-18 ENCOUNTER — Encounter: Payer: Self-pay | Admitting: Podiatry

## 2020-03-18 ENCOUNTER — Other Ambulatory Visit: Payer: Self-pay

## 2020-03-18 VITALS — Temp 97.1°F

## 2020-03-18 DIAGNOSIS — M79674 Pain in right toe(s): Secondary | ICD-10-CM

## 2020-03-18 DIAGNOSIS — M79675 Pain in left toe(s): Secondary | ICD-10-CM

## 2020-03-18 DIAGNOSIS — L84 Corns and callosities: Secondary | ICD-10-CM

## 2020-03-18 DIAGNOSIS — B351 Tinea unguium: Secondary | ICD-10-CM | POA: Diagnosis not present

## 2020-03-18 DIAGNOSIS — E1151 Type 2 diabetes mellitus with diabetic peripheral angiopathy without gangrene: Secondary | ICD-10-CM

## 2020-03-18 NOTE — Patient Instructions (Signed)
Diabetes Mellitus and Foot Care Foot care is an important part of your health, especially when you have diabetes. Diabetes may cause you to have problems because of poor blood flow (circulation) to your feet and legs, which can cause your skin to:  Become thinner and drier.  Break more easily.  Heal more slowly.  Peel and crack. You may also have nerve damage (neuropathy) in your legs and feet, causing decreased feeling in them. This means that you may not notice minor injuries to your feet that could lead to more serious problems. Noticing and addressing any potential problems early is the best way to prevent future foot problems. How to care for your feet Foot hygiene  Wash your feet daily with warm water and mild soap. Do not use hot water. Then, pat your feet and the areas between your toes until they are completely dry. Do not soak your feet as this can dry your skin.  Trim your toenails straight across. Do not dig under them or around the cuticle. File the edges of your nails with an emery board or nail file.  Apply a moisturizing lotion or petroleum jelly to the skin on your feet and to dry, brittle toenails. Use lotion that does not contain alcohol and is unscented. Do not apply lotion between your toes. Shoes and socks  Wear clean socks or stockings every day. Make sure they are not too tight. Do not wear knee-high stockings since they may decrease blood flow to your legs.  Wear shoes that fit properly and have enough cushioning. Always look in your shoes before you put them on to be sure there are no objects inside.  To break in new shoes, wear them for just a few hours a day. This prevents injuries on your feet. Wounds, scrapes, corns, and calluses  Check your feet daily for blisters, cuts, bruises, sores, and redness. If you cannot see the bottom of your feet, use a mirror or ask someone for help.  Do not cut corns or calluses or try to remove them with medicine.  If you  find a minor scrape, cut, or break in the skin on your feet, keep it and the skin around it clean and dry. You may clean these areas with mild soap and water. Do not clean the area with peroxide, alcohol, or iodine.  If you have a wound, scrape, corn, or callus on your foot, look at it several times a day to make sure it is healing and not infected. Check for: ? Redness, swelling, or pain. ? Fluid or blood. ? Warmth. ? Pus or a bad smell. General instructions  Do not cross your legs. This may decrease blood flow to your feet.  Do not use heating pads or hot water bottles on your feet. They may burn your skin. If you have lost feeling in your feet or legs, you may not know this is happening until it is too late.  Protect your feet from hot and cold by wearing shoes, such as at the beach or on hot pavement.  Schedule a complete foot exam at least once a year (annually) or more often if you have foot problems. If you have foot problems, report any cuts, sores, or bruises to your health care provider immediately. Contact a health care provider if:  You have a medical condition that increases your risk of infection and you have any cuts, sores, or bruises on your feet.  You have an injury that is not   healing.  You have redness on your legs or feet.  You feel burning or tingling in your legs or feet.  You have pain or cramps in your legs and feet.  Your legs or feet are numb.  Your feet always feel cold.  You have pain around a toenail. Get help right away if:  You have a wound, scrape, corn, or callus on your foot and: ? You have pain, swelling, or redness that gets worse. ? You have fluid or blood coming from the wound, scrape, corn, or callus. ? Your wound, scrape, corn, or callus feels warm to the touch. ? You have pus or a bad smell coming from the wound, scrape, corn, or callus. ? You have a fever. ? You have a red line going up your leg. Summary  Check your feet every day  for cuts, sores, red spots, swelling, and blisters.  Moisturize feet and legs daily.  Wear shoes that fit properly and have enough cushioning.  If you have foot problems, report any cuts, sores, or bruises to your health care provider immediately.  Schedule a complete foot exam at least once a year (annually) or more often if you have foot problems. This information is not intended to replace advice given to you by your health care provider. Make sure you discuss any questions you have with your health care provider. Document Revised: 08/22/2019 Document Reviewed: 12/31/2016 Elsevier Patient Education  2020 [employer]  

## 2020-03-24 ENCOUNTER — Emergency Department (HOSPITAL_COMMUNITY)
Admission: EM | Admit: 2020-03-24 | Discharge: 2020-03-25 | Disposition: A | Discharge status: Home / Self Care | Payer: Medicaid Other | Attending: Emergency Medicine | Admitting: Emergency Medicine

## 2020-03-24 ENCOUNTER — Encounter (HOSPITAL_COMMUNITY): Payer: Self-pay | Admitting: Emergency Medicine

## 2020-03-24 ENCOUNTER — Other Ambulatory Visit: Payer: Self-pay

## 2020-03-24 DIAGNOSIS — Z6841 Body Mass Index (BMI) 40.0 and over, adult: Secondary | ICD-10-CM | POA: Diagnosis not present

## 2020-03-24 DIAGNOSIS — Z7984 Long term (current) use of oral hypoglycemic drugs: Secondary | ICD-10-CM | POA: Diagnosis not present

## 2020-03-24 DIAGNOSIS — E119 Type 2 diabetes mellitus without complications: Secondary | ICD-10-CM | POA: Insufficient documentation

## 2020-03-24 DIAGNOSIS — E669 Obesity, unspecified: Secondary | ICD-10-CM | POA: Insufficient documentation

## 2020-03-24 DIAGNOSIS — Z87891 Personal history of nicotine dependence: Secondary | ICD-10-CM | POA: Insufficient documentation

## 2020-03-24 DIAGNOSIS — R112 Nausea with vomiting, unspecified: Secondary | ICD-10-CM

## 2020-03-24 DIAGNOSIS — Z79899 Other long term (current) drug therapy: Secondary | ICD-10-CM | POA: Diagnosis not present

## 2020-03-24 LAB — COMPREHENSIVE METABOLIC PANEL
ALT: 27 U/L (ref 0–44)
AST: 25 U/L (ref 15–41)
Albumin: 3.7 g/dL (ref 3.5–5.0)
Alkaline Phosphatase: 45 U/L (ref 38–126)
Anion gap: 10 (ref 5–15)
BUN: 11 mg/dL (ref 6–20)
CO2: 28 mmol/L (ref 22–32)
Calcium: 9.1 mg/dL (ref 8.9–10.3)
Chloride: 101 mmol/L (ref 98–111)
Creatinine, Ser: 0.87 mg/dL (ref 0.44–1.00)
GFR calc Af Amer: >60 mL/min (ref >60)
GFR calc non Af Amer: >60 mL/min (ref >60)
Glucose, Bld: 164 mg/dL — ABNORMAL HIGH (ref 70–99)
Potassium: 4.1 mmol/L (ref 3.5–5.1)
Sodium: 139 mmol/L (ref 135–145)
Total Bilirubin: 0.6 mg/dL (ref 0.3–1.2)
Total Protein: 8.1 g/dL (ref 6.5–8.1)

## 2020-03-24 LAB — URINALYSIS, ROUTINE W REFLEX MICROSCOPIC
Bilirubin Urine: NEGATIVE
Glucose, UA: 50 mg/dL — AB
Hgb urine dipstick: NEGATIVE
Ketones, ur: NEGATIVE mg/dL
Leukocytes,Ua: NEGATIVE
Nitrite: NEGATIVE
Protein, ur: 100 mg/dL — AB
Specific Gravity, Urine: 1.02 (ref 1.005–1.030)
pH: 7 (ref 5.0–8.0)

## 2020-03-24 LAB — CBC
HCT: 41.5 % (ref 36.0–46.0)
Hemoglobin: 13.1 g/dL (ref 12.0–15.0)
MCH: 25.8 pg — ABNORMAL LOW (ref 26.0–34.0)
MCHC: 31.6 g/dL (ref 30.0–36.0)
MCV: 81.7 fL (ref 80.0–100.0)
Platelets: 276 10*3/uL (ref 150–400)
RBC: 5.08 MIL/uL (ref 3.87–5.11)
RDW: 15.1 % (ref 11.5–15.5)
WBC: 5.3 10*3/uL (ref 4.0–10.5)
nRBC: 0 % (ref 0.0–0.2)

## 2020-03-24 LAB — LIPASE, BLOOD: Lipase: 31 U/L (ref 11–51)

## 2020-03-24 LAB — I-STAT BETA HCG BLOOD, ED (MC, WL, AP ONLY): I-stat hCG, quantitative: 7.9 m[IU]/mL — ABNORMAL HIGH (ref <5)

## 2020-03-24 LAB — CBG MONITORING, ED: Glucose-Capillary: 135 mg/dL — ABNORMAL HIGH (ref 70–99)

## 2020-03-24 MED ORDER — SODIUM CHLORIDE 0.9% FLUSH: 3.0000 mL | Freq: Once | INTRAVENOUS | Status: DC

## 2020-03-24 NOTE — Progress Notes (Signed)
Subjective: Kaitlyn Castillo presents today for follow up of preventative diabetic foot care and corn(s) b/l feet and painful mycotic toenails b/l that are difficult to trim. Pain interferes with ambulation. Aggravating factors include wearing enclosed shoe gear. Pain is relieved with periodic professional debridement..   Allergies  Allergen Reactions  . Iodine Anaphylaxis and Hives    Certain iodine in fish  . Shellfish Allergy Anaphylaxis    Iodine breaks her out and "stops her breathing"  . Aleve [Naproxen Sodium] Hives  . Iohexol Hives    Can possibly tolerate with Benadryl (but ask first??)   . Percocet [Oxycodone-Acetaminophen] Hives, Itching and Other (See Comments)    Causes paranoia     Objective: Vitals:   03/18/20 1121  Temp: (!) 97.1 F (36.2 C)    Pt 59 y.o. year old female  in NAD. AAO x 3.   Vascular Examination:  Capillary refill time to digits immediate b/l. Palpable DP pulses b/l. Nonpalpable PT pulses b/l. Pedal hair absent b/l Skin temperature gradient within normal limits b/l.  Dermatological Examination: Pedal skin with normal turgor, texture and tone bilaterally. No open wounds bilaterally. Toenails 1-5 b/l elongated, dystrophic, thickened, crumbly with subungual debris and tenderness to dorsal palpation. Hyperkeratotic lesion(s) L 2nd toe, L 3rd toe and R 2nd toe.  No erythema, no edema, no drainage, no flocculence.  Musculoskeletal: Normal muscle strength 5/5 to all lower extremity muscle groups bilaterally, no pain crepitus or joint limitation noted with ROM b/l and hammertoes noted to the  2-5 bilaterally  Neurological: Protective sensation intact 5/5 intact bilaterally with 10g monofilament b/l Vibratory sensation decreased b/l Babinski reflex negative b/l Clonus negative b/l  Assessment: 1. Pain due to onychomycosis of toenails of both feet   2. Corns   3. Type II diabetes mellitus with peripheral circulatory disorder (HCC)    Plan: -Continue  diabetic foot care principles. Literature dispensed on today.  -Toenails 1-5 b/l were debrided in length and girth with sterile nail nippers and dremel without iatrogenic bleeding.  -Corn(s) debrided L 2nd toe, L 3rd toe and R 2nd toe without complication or incident. Total number debrided=3. -Patient to continue soft, supportive shoe gear daily. -Patient to report any pedal injuries to medical professional immediately. Dispensed toe crest for left 2nd/3rd digits. Apply every morning, remove every evening. -Patient/POA to call should there be question/concern in the interim.  Return in about 10 weeks (around 05/27/2020) for diabetic nail and callus trim.

## 2020-03-24 NOTE — ED Triage Notes (Signed)
Pt in POV. Reports N/V/D X1 day. Denies abd pain. Reports she eats only fish and believes she came in contact with meat and that is why she is throwing up... States she has self diagnoses herself.

## 2020-03-25 MED ORDER — ONDANSETRON HCL 4 MG/2ML IJ SOLN
4.0000 mg | Freq: Once | INTRAMUSCULAR | Status: AC
  Administered 2020-03-25: 4 mg via INTRAVENOUS
  Filled 2020-03-25: qty 2

## 2020-03-25 MED ORDER — ONDANSETRON HCL 4 MG PO TABS: 4.0000 mg | ORAL_TABLET | Freq: Three times a day (TID) | ORAL | 0 refills | Status: DC | PRN

## 2020-03-25 MED ORDER — SODIUM CHLORIDE 0.9 % IV BOLUS
1000.0000 mL | Freq: Once | INTRAVENOUS | Status: AC
  Administered 2020-03-25: 1000 mL via INTRAVENOUS

## 2020-03-25 NOTE — ED Notes (Signed)
To Rm 19 via w/c -- getting in to gown without assistance,

## 2020-03-25 NOTE — Discharge Instructions (Addendum)
Use Zofran as needed for nausea or vomiting. Make sure stay well-hydrated water. Eat a bland diet until your symptoms improve. You may have continued soft or loose stools over the next several days. Follow with your primary care doctor for symptoms not proving. Return to the emergency room if you develop fevers, persistent vomiting despite medication, severe worsening abdominal pain, blood in your stool or vomit, or any new, worsening, or concerning symptoms.

## 2020-03-25 NOTE — ED Provider Notes (Signed)
[facility] EMERGENCY DEPARTMENT Provider Note   CSN: [id number] Arrival date & time: 03/24/20  1912     History Chief Complaint  Patient presents with  . Emesis    Kaitlyn Castillo is a 59 y.o. female presenting for evaluation of nausea and vomiting.  Patient states she developed symptoms yesterday morning.  She reports approximately 10 episodes of emesis over the past 24 hours.  She denies blood in her emesis.  Patient states she thinks this began because her food may been contaminated with meat products.  She is a [place], does not eat meat.  The last time she had symptoms like this, was after she accidentally ate bacon.  Patient reports 1 episode of loose stools, but no further diarrhea.  No bloody bowel movements.  She denies fevers, chills, chest pain, shortness breath, cough, abdominal pain, urinary symptoms.  She has a history of diabetes, but states this is well controlled.  She denies sick contacts.  She has not taken anything for her symptoms.  She states she has not been able to tolerate p.o. for the past 24 hours.  HPI     Past Medical History:  Diagnosis Date  . Arthritis   . Cellulitis of left leg 07/07/2012  . Charcot's joint of foot   . Constipation   . Diabetes mellitus   . Head injury, closed, with brief LOC ([place])   . High cholesterol   . Hypertension   . Knee pain, right   . Obese   . Obesity (BMI 30-39.9) 12/08/2017  . Shortness of breath    with too much fluid- not currently    Patient Active Problem List   Diagnosis Date Noted  . Obesity (BMI 30-39.9) 12/08/2017  . Streptococcal bacteremia 12/06/2017  . Uncontrolled type 2 diabetes mellitus with hyperglycemia ([place]) 12/06/2017  . Hyponatremia 12/06/2017  . Postmenopausal bleeding 08/27/2015  . Adenomyosis 08/27/2015  . Skin ulcer of left foot including toes ([place]) 05/02/2015  . Equinus deformity of foot, acquired 04/02/2015  . Tenosynovitis of left foot 04/02/2015  .  Arthritis of knee 08/13/2014  . Onychomycosis 11/02/2013  . Acquired deformity of toe 11/02/2013  . Unspecified venous (peripheral) insufficiency 09/13/2012  . Cellulitis of left leg 07/07/2012  . ANXIETY 11/19/2007  . DIABETES MELLITUS, TYPE II 07/29/2007  . DEPRESSION 07/29/2007  . Essential hypertension 07/29/2007    Past Surgical History:  Procedure Laterality Date  . carbuncle     back of head  . CESAREAN SECTION    . COLONOSCOPY    . JOINT REPLACEMENT    . KNEE SURGERY     quad  . QUADRICEPS REPAIR    . TOTAL KNEE ARTHROPLASTY Left 08/13/2014   Procedure: TOTAL KNEE ARTHROPLASTY;  [place]: Meredith Pel, MD;  Location: [facility];  Service: Orthopedics;  Laterality: Left;     OB History    Gravida  8   Para  4   Term  4   Preterm      AB  4   Living  4     SAB      TAB  4   Ectopic      Multiple      Live Births  4           Family History  Problem Relation Age of Onset  . Diabetes Mother   . Hyperlipidemia Mother   . Hypertension Mother   . Diabetes Father   . Hyperlipidemia Father   .  Hypertension Father   . Cancer Brother   . Heart disease Brother   . Hypertension Brother   . Heart attack Brother     Social History   Tobacco Use  . Smoking status: Former Smoker    Years: 0.00    Types: Cigarettes    Quit date: 12/13/1978    Years since quitting: 41.3  . Smokeless tobacco: Never Used  Substance Use Topics  . Alcohol use: No    Alcohol/week: 0.0 standard drinks  . Drug use: No    Home Medications Prior to Admission medications   Medication Sig Start Date End Date Taking? Authorizing Provider  ACCU-CHEK AVIVA PLUS test strip 3 (three) times daily. for testing 10/03/18   [provider]  ACCU-CHEK FASTCLIX LANCETS MISC TEST QID 09/02/18   [provider]  acetaminophen (TYLENOL) 325 MG tablet Take 650 mg by mouth every 6 (six) hours as needed.    [provider]  Acetaminophen-Codeine 300-30 MG tablet  Take by mouth.    [provider]  atenolol (TENORMIN) 25 MG tablet Take by mouth.    [provider]  diclofenac sodium (VOLTAREN) 1 % GEL Apply 2 g topically 4 (four) times daily. 09/23/19   Raylene Everts, MD  doxycycline (VIBRA-TABS) 100 MG tablet Take 1 tablet (100 mg total) by mouth 2 (two) times daily. 09/21/19   Robyn Haber, MD  Dulaglutide (TRULICITY) 1.5 0000000 SOPN Inject into the skin.    [provider]  exenatide (BYETTA 10 MCG PEN) 10 MCG/0.04ML SOPN injection Inject 10 mcg into the skin 2 (two) times daily with a meal.    [provider]  lisinopril-hydrochlorothiazide (PRINZIDE,ZESTORETIC) 20-12.5 MG tablet Take 1 tablet by mouth daily. 11/28/17   [provider]  metFORMIN (GLUCOPHAGE) 500 MG tablet TK 1 T PO BID 03/18/19   [provider]  ondansetron (ZOFRAN) 4 MG tablet Take 1 tablet (4 mg total) by mouth every 8 (eight) hours as needed for nausea or vomiting. 03/25/20   Deania Siguenza, PA-C  phentermine 37.5 MG capsule Take by mouth.    [provider]  RESTASIS 0.05 % ophthalmic emulsion INSTILL 1 DROP OU BID 04/03/19   [provider]  simvastatin (ZOCOR) 40 MG tablet Take by mouth.    [provider]  VICTOZA 18 MG/3ML SOPN INJECT 1.8 MG DAILY 04/10/19   [provider]    Allergies    Iodine, Shellfish allergy, Aleve [naproxen sodium], Iohexol, and Percocet [oxycodone-acetaminophen]  Review of Systems   Review of Systems  Gastrointestinal: Positive for diarrhea (x1 episode), nausea and vomiting.  All other systems reviewed and are negative.   Physical Exam Updated Vital Signs BP (!) 145/68 (BP Location: Right Arm)   Pulse 87   Temp 98 F (36.7 C) (Oral)   Resp 17   Ht 5\' 4"  (1.626 m)   Wt 113.4 kg   LMP 08/22/2015   SpO2 99%   BMI 42.91 kg/m   Physical Exam Vitals and nursing note reviewed.  Constitutional:      General: She is not in acute distress.     Appearance: She is well-developed. She is obese.     Comments: Obese female resting comfortably in the bed in no acute distress  HENT:     Head: Normocephalic and atraumatic.  Eyes:     Conjunctiva/sclera: Conjunctivae normal.     Pupils: Pupils are equal, round, and reactive to light.  Cardiovascular:     Rate and  Rhythm: Normal rate and regular rhythm.     Pulses: Normal pulses.  Pulmonary:     Effort: Pulmonary effort is normal. No respiratory distress.     Breath sounds: Normal breath sounds. No wheezing.  Abdominal:     General: There is no distension.     Palpations: Abdomen is soft. There is no mass.     Tenderness: There is no abdominal tenderness. There is no guarding or rebound.     Comments: No tenderness palpation of the abdomen.  No rigidity, guarding, distention.  Negative rebound.  No peritonitis.  Musculoskeletal:        General: Normal range of motion.     Cervical back: Normal range of motion and neck supple.     Right lower leg: No edema.     Left lower leg: No edema.  Skin:    General: Skin is warm and dry.     Capillary Refill: Capillary refill takes less than 2 seconds.  Neurological:     Mental Status: She is alert and oriented to person, place, and time.     ED Results / Procedures / Treatments   Labs (all labs ordered are listed, but only abnormal results are displayed) Labs Reviewed  COMPREHENSIVE METABOLIC PANEL - Abnormal; Notable for the following components:      Result Value   Glucose, Bld 164 (*)    All other components within normal limits  CBC - Abnormal; Notable for the following components:   MCH 25.8 (*)    All other components within normal limits  URINALYSIS, ROUTINE W REFLEX MICROSCOPIC - Abnormal; Notable for the following components:   Glucose, UA 50 (*)    Protein, ur 100 (*)    Bacteria, UA RARE (*)    All other components within normal limits  CBG MONITORING, ED - Abnormal; Notable for the following components:    Glucose-Capillary 135 (*)    All other components within normal limits  I-STAT BETA HCG BLOOD, ED ([facility], [facility], [facility] ONLY) - Abnormal; Notable for the following components:   I-stat hCG, quantitative 7.9 (*)    All other components within normal limits  LIPASE, BLOOD    EKG None  Radiology No results found.  Procedures Procedures (including critical care time)  Medications Ordered in ED Medications  sodium chloride flush (NS) 0.9 % injection 3 mL (has no administration in time range)  ondansetron (ZOFRAN) injection 4 mg (4 mg Intravenous Given 03/25/20 0902)  sodium chloride 0.9 % bolus 1,000 mL (1,000 mLs Intravenous New Bag/Given 03/25/20 0902)    ED Course  I have reviewed the triage vital signs and the nursing notes.  Pertinent labs & imaging results that were available during my care of the patient were reviewed by me and considered in my medical decision making (see chart for details).    MDM Rules/Calculators/A&P                      Patient presented for evaluation nausea and vomiting.  On exam, patient appears nontoxic.  No abdominal tenderness.  No fevers.  As such, doubt intra-abdominal infection.  Consider viral GI illness versus foodborne illness.  Labs obtained from triage reassuring.  No leukocytosis.  Electrolytes stable.  Mild hyperglycemia at 164, but no signs of DKA.  Of note, patient's i-STAT hCG is elevated at 7.9.  She is postmenopausal, this is likely falsely elevated.  I do not believe patient is pregnant.  Will give Zofran and fluids  for symptom control and reassess.  On reassessment, patient reports nausea is improved.  Will trial p.o.  On reassessment, patient tolerating p.o. without difficulty.  Discussed continued symptomatic treatment at home, follow-up with PCP symptoms not improving.  Prompt return to the ER encouraged if patient develops any fever, abdominal pain, worsening symptoms.  At this time, patient appears safe for discharge.  Return precautions  given. Patient states she understands and agrees to plan.  Final Clinical Impression(s) / ED Diagnoses Final diagnoses:  Non-intractable vomiting with nausea, unspecified vomiting type    Rx / DC Orders ED Discharge Orders         Ordered    ondansetron (ZOFRAN) 4 MG tablet  Every 8 hours PRN     03/25/20 0951           Franchot Heidelberg, PA-C 03/25/20 1018    Wyvonnia Dusky, MD 03/25/20 2050

## 2020-06-10 ENCOUNTER — Other Ambulatory Visit: Payer: Self-pay

## 2020-06-10 ENCOUNTER — Ambulatory Visit: Payer: Medicaid Other | Admitting: Podiatry

## 2020-06-10 VITALS — Temp 96.8°F

## 2020-06-10 DIAGNOSIS — M79674 Pain in right toe(s): Secondary | ICD-10-CM | POA: Diagnosis not present

## 2020-06-10 DIAGNOSIS — L84 Corns and callosities: Secondary | ICD-10-CM

## 2020-06-10 DIAGNOSIS — M79675 Pain in left toe(s): Secondary | ICD-10-CM

## 2020-06-10 DIAGNOSIS — B351 Tinea unguium: Secondary | ICD-10-CM

## 2020-06-10 DIAGNOSIS — E1151 Type 2 diabetes mellitus with diabetic peripheral angiopathy without gangrene: Secondary | ICD-10-CM | POA: Diagnosis not present

## 2020-06-10 NOTE — Patient Instructions (Signed)
Diabetes Mellitus and Foot Care Foot care is an important part of your health, especially when you have diabetes. Diabetes may cause you to have problems because of poor blood flow (circulation) to your feet and legs, which can cause your skin to:  Become thinner and drier.  Break more easily.  Heal more slowly.  Peel and crack. You may also have nerve damage (neuropathy) in your legs and feet, causing decreased feeling in them. This means that you may not notice minor injuries to your feet that could lead to more serious problems. Noticing and addressing any potential problems early is the best way to prevent future foot problems. How to care for your feet Foot hygiene  Wash your feet daily with warm water and mild soap. Do not use hot water. Then, pat your feet and the areas between your toes until they are completely dry. Do not soak your feet as this can dry your skin.  Trim your toenails straight across. Do not dig under them or around the cuticle. File the edges of your nails with an emery board or nail file.  Apply a moisturizing lotion or petroleum jelly to the skin on your feet and to dry, brittle toenails. Use lotion that does not contain alcohol and is unscented. Do not apply lotion between your toes. Shoes and socks  Wear clean socks or stockings every day. Make sure they are not too tight. Do not wear knee-high stockings since they may decrease blood flow to your legs.  Wear shoes that fit properly and have enough cushioning. Always look in your shoes before you put them on to be sure there are no objects inside.  To break in new shoes, wear them for just a few hours a day. This prevents injuries on your feet. Wounds, scrapes, corns, and calluses  Check your feet daily for blisters, cuts, bruises, sores, and redness. If you cannot see the bottom of your feet, use a mirror or ask someone for help.  Do not cut corns or calluses or try to remove them with medicine.  If you  find a minor scrape, cut, or break in the skin on your feet, keep it and the skin around it clean and dry. You may clean these areas with mild soap and water. Do not clean the area with peroxide, alcohol, or iodine.  If you have a wound, scrape, corn, or callus on your foot, look at it several times a day to make sure it is healing and not infected. Check for: ? Redness, swelling, or pain. ? Fluid or blood. ? Warmth. ? Pus or a bad smell. General instructions  Do not cross your legs. This may decrease blood flow to your feet.  Do not use heating pads or hot water bottles on your feet. They may burn your skin. If you have lost feeling in your feet or legs, you may not know this is happening until it is too late.  Protect your feet from hot and cold by wearing shoes, such as at the beach or on hot pavement.  Schedule a complete foot exam at least once a year (annually) or more often if you have foot problems. If you have foot problems, report any cuts, sores, or bruises to your health care provider immediately. Contact a health care provider if:  You have a medical condition that increases your risk of infection and you have any cuts, sores, or bruises on your feet.  You have an injury that is not   healing.  You have redness on your legs or feet.  You feel burning or tingling in your legs or feet.  You have pain or cramps in your legs and feet.  Your legs or feet are numb.  Your feet always feel cold.  You have pain around a toenail. Get help right away if:  You have a wound, scrape, corn, or callus on your foot and: ? You have pain, swelling, or redness that gets worse. ? You have fluid or blood coming from the wound, scrape, corn, or callus. ? Your wound, scrape, corn, or callus feels warm to the touch. ? You have pus or a bad smell coming from the wound, scrape, corn, or callus. ? You have a fever. ? You have a red line going up your leg. Summary  Check your feet every day  for cuts, sores, red spots, swelling, and blisters.  Moisturize feet and legs daily.  Wear shoes that fit properly and have enough cushioning.  If you have foot problems, report any cuts, sores, or bruises to your health care provider immediately.  Schedule a complete foot exam at least once a year (annually) or more often if you have foot problems. This information is not intended to replace advice given to you by your health care provider. Make sure you discuss any questions you have with your health care provider. Document Revised: 08/22/2019 Document Reviewed: 12/31/2016 Elsevier Patient Education  2020 [employer]  

## 2020-06-14 ENCOUNTER — Encounter: Payer: Self-pay | Admitting: Podiatry

## 2020-06-14 NOTE — Progress Notes (Signed)
Subjective: Kaitlyn Castillo is a pleasant 59 y.o. female patient seen today at risk foot care. Patient has history of uncontrolled diabetes and h/o osteomyelitis of left 2nd digit which required aggressive wound care and hyperbaric oxygen treatment to save the digit.   She has also brought her son in today who also has a foot problem. She is taking care of her grandson on today's visit.   Past Medical History:  Diagnosis Date  . Arthritis   . Cellulitis of left leg 07/07/2012  . Charcot's joint of foot   . Constipation   . Diabetes mellitus   . Head injury, closed, with brief LOC ([place])   . High cholesterol   . Hypertension   . Knee pain, right   . Obese   . Obesity (BMI 30-39.9) 12/08/2017  . Shortness of breath    with too much fluid- not currently    Patient Active Problem List   Diagnosis Date Noted  . Obesity (BMI 30-39.9) 12/08/2017  . Streptococcal bacteremia 12/06/2017  . Uncontrolled type 2 diabetes mellitus with hyperglycemia ([place]) 12/06/2017  . Hyponatremia 12/06/2017  . Postmenopausal bleeding 08/27/2015  . Adenomyosis 08/27/2015  . Skin ulcer of left foot including toes ([place]) 05/02/2015  . Equinus deformity of foot, acquired 04/02/2015  . Tenosynovitis of left foot 04/02/2015  . Arthritis of knee 08/13/2014  . Onychomycosis 11/02/2013  . Acquired deformity of toe 11/02/2013  . Unspecified venous (peripheral) insufficiency 09/13/2012  . Cellulitis of left leg 07/07/2012  . ANXIETY 11/19/2007  . DIABETES MELLITUS, TYPE II 07/29/2007  . DEPRESSION 07/29/2007  . Essential hypertension 07/29/2007    Current Outpatient Medications on File Prior to Visit  Medication Sig Dispense Refill  . ACCU-CHEK AVIVA PLUS test strip 3 (three) times daily. for testing  5  . ACCU-CHEK FASTCLIX LANCETS MISC TEST QID  2  . acetaminophen (TYLENOL) 325 MG tablet Take 650 mg by mouth every 6 (six) hours as needed.    . Acetaminophen-Codeine 300-30 MG tablet Take by mouth.    Marland Kitchen  atenolol (TENORMIN) 25 MG tablet Take by mouth.    . diclofenac sodium (VOLTAREN) 1 % GEL Apply 2 g topically 4 (four) times daily. 100 g 0  . doxycycline (VIBRA-TABS) 100 MG tablet Take 1 tablet (100 mg total) by mouth 2 (two) times daily. 20 tablet 0  . Dulaglutide (TRULICITY) 1.5 WU/1.3KG SOPN Inject into the skin.    Marland Kitchen exenatide (BYETTA 10 MCG PEN) 10 MCG/0.04ML SOPN injection Inject 10 mcg into the skin 2 (two) times daily with a meal.    . lisinopril-hydrochlorothiazide (PRINZIDE,ZESTORETIC) 20-12.5 MG tablet Take 1 tablet by mouth daily.  0  . metFORMIN (GLUCOPHAGE) 500 MG tablet TK 1 T PO BID    . ondansetron (ZOFRAN) 4 MG tablet Take 1 tablet (4 mg total) by mouth every 8 (eight) hours as needed for nausea or vomiting. 12 tablet 0  . phentermine 37.5 MG capsule Take by mouth.    . RESTASIS 0.05 % ophthalmic emulsion INSTILL 1 DROP OU BID    . simvastatin (ZOCOR) 40 MG tablet Take by mouth.    Marland Kitchen VICTOZA 18 MG/3ML SOPN INJECT 1.8 MG DAILY     No current facility-administered medications on file prior to visit.    Allergies  Allergen Reactions  . Iodine Anaphylaxis and Hives    Certain iodine in fish  . Shellfish Allergy Anaphylaxis    Iodine breaks her out and "stops her breathing"  . Aleve [Naproxen Sodium] Hives  .  Iohexol Hives    Can possibly tolerate with Benadryl (but ask first??)   . Percocet [Oxycodone-Acetaminophen] Hives, Itching and Other (See Comments)    Causes paranoia    Objective: Physical Exam  General: Kaitlyn Castillo is a pleasant 59 y.o. African American female, morbidly obese in NAD. AAO x 3.   Vascular:  Capillary fill time to digits <3 seconds b/l lower extremities. Palpable DP pulse(s) b/l lower extremities Nonpalpable PT pulse(s) b/l lower extremities. Pedal hair absent. Lower extremity skin temperature gradient within normal limits. No pain with calf compression b/l. Trace edema noted b/l lower extremities.  Dermatological:  Pedal skin with  normal turgor, texture and tone bilaterally. No open wounds bilaterally. No interdigital macerations bilaterally. Toenails 1-5 b/l elongated, discolored, dystrophic, thickened, crumbly with subungual debris and tenderness to dorsal palpation. Hyperkeratotic lesion(s) 0.5 cm in diameter to  L 2nd toe, L 3rd toe and R 2nd toe with subdermal hemorrhage. Lesions are not full thickness.  No erythema, no edema, no active drainage, no flocculence.  Musculoskeletal:  Normal muscle strength 5/5 to all lower extremity muscle groups bilaterally. No pain crepitus or joint limitation noted with ROM b/l. Hammertoes noted to the 2-5 bilaterally.  Neurological:  Protective sensation intact 5/5 intact bilaterally with 10g monofilament b/l. Vibratory sensation decreased b/l.  Assessment and Plan:  1. Pain due to onychomycosis of toenails of both feet   2. Pre-ulcerative corn or callous   3. Type II diabetes mellitus with peripheral circulatory disorder (HCC)    -Examined patient. -Continue diabetic foot care principles. -Toenails 1-5 b/l were debrided in length and girth with sterile nail nippers and dremel without iatrogenic bleeding.  -Preulcerative lesions pared without complication b/l 2nd and left 3rd digit. Will keep a closer eye on her due to h/o osteomyelitis of left 2nd toe. Continue toe crest for digits daily. -Continue soft, supportive shoe gear daily. -Patient to call should she have a concern in the interim.  Return in about 4 weeks (around 07/08/2020) for at risk foot care, diabetic callus trim.  [name] Board, DPM

## 2020-07-09 ENCOUNTER — Other Ambulatory Visit: Payer: Self-pay

## 2020-07-09 ENCOUNTER — Encounter: Payer: Self-pay | Admitting: Podiatry

## 2020-07-09 ENCOUNTER — Ambulatory Visit (INDEPENDENT_AMBULATORY_CARE_PROVIDER_SITE_OTHER): Payer: Medicaid Other | Admitting: Podiatry

## 2020-07-09 DIAGNOSIS — L84 Corns and callosities: Secondary | ICD-10-CM

## 2020-07-12 NOTE — Progress Notes (Signed)
Subjective:  Patient ID: Kaitlyn Castillo, female    DOB: 08-09-1961,  MRN: [id number]  59 y.o. female is seen for follow up preulcerative lesions L 2nd toe. Patient has h/o osteomyelitis left 2nd digit treated by Wound Care and hyperbaric oxygen.  Patient The patient is having no constitutional symptoms, denying fever, chills, anorexia, or weight loss..  Past Medical History:  Diagnosis Date   Arthritis    Cellulitis of left leg 07/07/2012   Charcot's joint of foot    Constipation    Diabetes mellitus    Head injury, closed, with brief LOC ([place])    High cholesterol    Hypertension    Knee pain, right    Obese    Obesity (BMI 30-39.9) 12/08/2017   Shortness of breath    with too much fluid- not currently     Past Surgical History:  Procedure Laterality Date   carbuncle     back of head   CESAREAN SECTION     COLONOSCOPY     JOINT REPLACEMENT     KNEE SURGERY     quad   QUADRICEPS REPAIR     TOTAL KNEE ARTHROPLASTY Left 08/13/2014   Procedure: TOTAL KNEE ARTHROPLASTY;  [place]: Meredith Pel, MD;  Location: [facility];  Service: Orthopedics;  Laterality: Left;     Current Outpatient Medications on File Prior to Visit  Medication Sig Dispense Refill   ACCU-CHEK AVIVA PLUS test strip 3 (three) times daily. for testing  5   ACCU-CHEK FASTCLIX LANCETS MISC TEST QID  2   acetaminophen (TYLENOL) 325 MG tablet Take 650 mg by mouth every 6 (six) hours as needed.     Acetaminophen-Codeine 300-30 MG tablet Take by mouth.     atenolol (TENORMIN) 25 MG tablet Take by mouth.     diclofenac sodium (VOLTAREN) 1 % GEL Apply 2 g topically 4 (four) times daily. 100 g 0   doxycycline (VIBRA-TABS) 100 MG tablet Take 1 tablet (100 mg total) by mouth 2 (two) times daily. 20 tablet 0   Dulaglutide (TRULICITY) 1.5 LZ/7.6BH SOPN Inject into the skin.     exenatide (BYETTA 10 MCG PEN) 10 MCG/0.04ML SOPN injection Inject 10 mcg into the skin 2 (two) times daily with a  meal.     lisinopril-hydrochlorothiazide (PRINZIDE,ZESTORETIC) 20-12.5 MG tablet Take 1 tablet by mouth daily.  0   metFORMIN (GLUCOPHAGE) 500 MG tablet TK 1 T PO BID     ondansetron (ZOFRAN) 4 MG tablet Take 1 tablet (4 mg total) by mouth every 8 (eight) hours as needed for nausea or vomiting. 12 tablet 0   phentermine 37.5 MG capsule Take by mouth.     RESTASIS 0.05 % ophthalmic emulsion INSTILL 1 DROP OU BID     simvastatin (ZOCOR) 40 MG tablet Take by mouth.     VICTOZA 18 MG/3ML SOPN INJECT 1.8 MG DAILY     No current facility-administered medications on file prior to visit.     Allergies  Allergen Reactions   Iodine Anaphylaxis and Hives    Certain iodine in fish   Shellfish Allergy Anaphylaxis    Iodine breaks her out and "stops her breathing"   Aleve [Naproxen Sodium] Hives   Iohexol Hives    Can possibly tolerate with Benadryl (but ask first??)    Percocet [Oxycodone-Acetaminophen] Hives, Itching and Other (See Comments)    Causes paranoia     Family History  Problem Relation Age of Onset   Diabetes Mother  Hyperlipidemia Mother    Hypertension Mother    Diabetes Father    Hyperlipidemia Father    Hypertension Father    Cancer Brother    Heart disease Brother    Hypertension Brother    Heart attack Brother      Social History   Occupational History   Not on file  Tobacco Use   Smoking status: Former Smoker    Years: 0.00    Types: Cigarettes    Quit date: 12/13/1978    Years since quitting: 41.6   Smokeless tobacco: Never Used  Vaping Use   Vaping Use: Never used  Substance and Sexual Activity   Alcohol use: No    Alcohol/week: 0.0 standard drinks   Drug use: No   Sexual activity: Not on file     Immunization History  Administered Date(s) Administered   Pneumococcal Polysaccharide-23 08/15/2014   Tdap 07/07/2012    Objective:  Physical Exam: There were no vitals filed for this visit.   Kaitlyn Castillo is a  pleasant 59 y.o. African American female morbidly obese in NAD. AAO x 3.  Vascular Examination: Capillary fill time to digits <3 seconds b/l lower extremities. Palpable DP pulse(s) b/l lower extremities Nonpalpable PT pulse(s) b/l lower extremities. Pedal hair absent. Lower extremity skin temperature gradient within normal limits. No pain with calf compression b/l. Trace edema noted b/l lower extremities.  Dermatological Examination: Pedal skin with normal turgor, texture and tone bilaterally. No interdigital macerations bilaterally. Hyperkeratotic lesions distal tip b/l 2nd toes and left 3rd digits with subdermal hemorrhage. No edema, no erythema, no drainage, no flocculenc.  Wound Location: left 2nd toe There is a minimal amount of devitalized tissue present in the wound. Predebridement Wound Measurement:  0.3 x 0.2 cm Postdebridement Wound Measurement: 0.5  x 0.5 cm Wound Base: epithelial Peri-wound: Normal Exudate: None: wound tissue dry Blood Loss during debridement: 0 cc('s). Material in wound which inhibits healing/promotes adjacent tissue breakdown:  nonviable hyperkeratosis. Description of tissue removed from ulceration today:  nonviable hyperkeratosis. Sign(s) of clinical bacterial infection: no clinical signs of infection noted on examination today.  Musculoskeletal Examination: Normal muscle strength 5/5 to all lower extremity muscle groups bilaterally. No pain crepitus or joint limitation noted with ROM b/l. Hammertoes noted to the 2-5 bilaterally.  Neurological Examination: Protective sensation intact 5/5 intact bilaterally with 10g monofilament b/l. Vibratory sensation intact b/l.   Assessment:  No diagnosis found. Plan:  -Patient was evaluated and treated and all questions answered.  -Patient/POA/Family member educated on diagnosis and treatment plan of routine ulcer debridement/wound care.  -Partial thickness ulceration debridement achieved utilizing sharp excisional  debridement with sterile scalpel blade.. Type/amount of devitalized tissue removed: nonviable hyperkeratosis -Today's ulcer size post-debridement: 0.5 x 0.5 x 0.1 cm -Ulceration cleansed with wound cleanser. triple antibiotic ointment applied to base of ulceration and secured with light dressing. -Wound responded well to today's debridement. -Patient risk factors affecting healing of ulcer: uncontrolled diabetes, foot deformity, history of previous ulceration -Examined patient. -Patient to report any pedal injuries to medical professional immediately. -Patient to continue soft, supportive shoe gear daily. -Patient/POA to call should there be question/concern in the interim.  Return in about 3 weeks (around 07/30/2020) for partial thickness ulcer b/l 2nd, left 3rd toes.  [name] Board, DPM

## 2020-07-22 ENCOUNTER — Other Ambulatory Visit: Payer: Self-pay

## 2020-07-22 ENCOUNTER — Ambulatory Visit (HOSPITAL_COMMUNITY)
Admission: EM | Admit: 2020-07-22 | Discharge: 2020-07-22 | Disposition: A | Discharge status: Home / Self Care | Payer: Medicaid Other | Attending: Family Medicine | Admitting: Family Medicine

## 2020-07-22 ENCOUNTER — Encounter (HOSPITAL_COMMUNITY): Payer: Self-pay | Admitting: Emergency Medicine

## 2020-07-22 DIAGNOSIS — L089 Local infection of the skin and subcutaneous tissue, unspecified: Secondary | ICD-10-CM

## 2020-07-22 DIAGNOSIS — R600 Localized edema: Secondary | ICD-10-CM | POA: Diagnosis not present

## 2020-07-22 MED ORDER — DOXYCYCLINE HYCLATE 100 MG PO CAPS: 100.0000 mg | ORAL_CAPSULE | Freq: Two times a day (BID) | ORAL | 0 refills | Status: DC

## 2020-07-22 NOTE — ED Triage Notes (Signed)
Pt presents to [facility] for assessment of left leg cellulitis x 1 week.

## 2020-07-23 ENCOUNTER — Ambulatory Visit: Payer: Medicaid Other | Admitting: Podiatry

## 2020-07-23 ENCOUNTER — Encounter: Payer: Self-pay | Admitting: Podiatry

## 2020-07-23 ENCOUNTER — Ambulatory Visit (INDEPENDENT_AMBULATORY_CARE_PROVIDER_SITE_OTHER): Payer: Medicaid Other

## 2020-07-23 VITALS — BP 156/88 | HR 75 | Temp 98.3°F

## 2020-07-23 DIAGNOSIS — L03116 Cellulitis of left lower limb: Secondary | ICD-10-CM

## 2020-07-23 DIAGNOSIS — L97522 Non-pressure chronic ulcer of other part of left foot with fat layer exposed: Secondary | ICD-10-CM | POA: Diagnosis not present

## 2020-07-23 DIAGNOSIS — M2042 Other hammer toe(s) (acquired), left foot: Secondary | ICD-10-CM

## 2020-07-23 DIAGNOSIS — E11621 Type 2 diabetes mellitus with foot ulcer: Secondary | ICD-10-CM

## 2020-07-23 DIAGNOSIS — L97529 Non-pressure chronic ulcer of other part of left foot with unspecified severity: Secondary | ICD-10-CM | POA: Diagnosis not present

## 2020-07-23 NOTE — Progress Notes (Signed)
  Subjective:  Patient ID: Kaitlyn Castillo, female    DOB: 1961/06/02,  MRN: [id number]  Chief Complaint  Patient presents with  . Wound Check    L foot, 3rd toe. Pt stated, "My toe started throbbing a few days ago, but I didn't see any pus. A couple of days ago, the leg started to hurt and swell, and then I found pus in my sock. Pain was 10/10. I went to the ER last night. They told me that I had an infection and cellulitis. I received a Rx for doxycycline BID x10 days. No fever/chills/N&V/foul odor".    59 y.o. female presents with the above complaint. History confirmed with patient. Overall starting to feel better than it was  Objective:  Physical Exam: warm, good capillary refill, no trophic changes or ulcerative lesions, normal DP and PT pulses and normal sensory exam. Left Foot: 3rd digit distal tip callus with epidermolysis, removal of this reveals an ulcer 0.8cm x 0.4cm x 0.2cm, granular base, no purulence, no exposed bone or tendon     Radiographs: X-ray of left foot: no clear evidence of osteomyelitis of 3rd digit Assessment:   1. 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 Hammertoe of left foot   3. Cellulitis of left leg      Plan:  Patient was evaluated and treated and all questions answered.  - Digital contracture offloaded with silicone toe crest - If this does not improve her condition, we will consider flexor tenotomy - She will return to see Dr Elisha Ponder next week as scheduled, may see me again as needed for tenotomy and ulcer care - Finish doxycyline as prescribed  Ulcer left 3rd toe -Debridement as below. -Dressed with medihoney and adhesive bandage. Mupirocin prescribed for home. -Continue off-loading with surgical shoe.  Procedure: Excisional Debridement of Wound Rationale: Removal of non-viable soft tissue from the wound to promote healing.  Anesthesia: none Pre-Debridement Wound  Measurements: 0.2 cm x 0.2 cm x 0.2 cm  Post-Debridement Wound Measurements: 0.8 cm x 0.4 cm x 0.2 cm  Type of Debridement: Sharp Excisional Tissue Removed: Non-viable soft tissue Depth of Debridement: subcutaneous tissue. Technique: Sharp excisional debridement to bleeding, viable wound base.  Dressing: Dry, sterile, compression dressing. Disposition: Patient tolerated procedure well. Patient to return in 1 week for follow-up.  Return in about 1 week (around 07/30/2020) for wound re-check.

## 2020-07-23 NOTE — Addendum Note (Signed)
Addended bySherryle Lis, Laiden Milles R on: 07/23/2020 09:55 PM   Modules accepted: Level of Service

## 2020-07-25 NOTE — ED Provider Notes (Signed)
[place]   [id number] 07/22/20 Arrival Time: 1924  ASSESSMENT & PLAN:  1. Lower extremity edema   2. Localized bacterial skin infection     Given her history, will place on antibiotics. Discussed venous stasis changes.  Meds ordered this encounter  Medications  . doxycycline (VIBRAMYCIN) 100 MG capsule    Sig: Take 1 capsule (100 mg total) by mouth 2 (two) times daily.    Dispense:  20 capsule    Refill:  0     Follow-up Information    [facility] at [facility] In 2 days.   Specialty: Urgent Care Why: For a recheck. Contact information: [place] [phone]              Reviewed expectations re: course of current medical issues. Questions answered. Outlined signs and symptoms indicating need for more acute intervention. Patient verbalized understanding. After Visit Summary given.   SUBJECTIVE:  Kaitlyn Castillo is a 59 y.o. female who presents with a skin complaint. Reports frequent cellulitis of her LLE. Feels skin is getting red/hot over the past week. Afebrile. Is more painful. Ambulatory. No extremity sensation changes or weakness.  No LLE injury/trauma reported.  OBJECTIVE: Vitals:   07/22/20 2007  BP: 133/64  Pulse: 74  Resp: 18  Temp: 98.6 F (37 C)  TempSrc: Oral  SpO2: 100%    General appearance: alert; no distress HEENT: [place]; AT Neck: supple with FROM Lungs: clear to auscultation bilaterally Heart: regular rate and rhythm Extremities: no edema; moves all extremities normally Skin: warm and dry; lower extremities with venous stasis changes; slight erythema of anterior LLE; warm to touch; no open wounds Psychological: alert and cooperative; normal mood and affect  Allergies  Allergen Reactions  . Iodine Anaphylaxis and Hives    Certain iodine in fish  . Shellfish Allergy Anaphylaxis    Iodine breaks her out and "stops her breathing"  . Aleve [Naproxen Sodium] Hives  .  Iohexol Hives    Can possibly tolerate with Benadryl (but ask first??)   . Percocet [Oxycodone-Acetaminophen] Hives, Itching and Other (See Comments)    Causes paranoia    Past Medical History:  Diagnosis Date  . Arthritis   . Cellulitis of left leg 07/07/2012  . Charcot's joint of foot   . Constipation   . Diabetes mellitus   . Head injury, closed, with brief LOC ([place])   . High cholesterol   . Hypertension   . Knee pain, right   . Obese   . Obesity (BMI 30-39.9) 12/08/2017  . Shortness of breath    with too much fluid- not currently   Social History   Socioeconomic History  . Marital status: Divorced    Spouse name: Not on file  . Number of children: Not on file  . Years of education: Not on file  . Highest education level: Not on file  Occupational History  . Not on file  Tobacco Use  . Smoking status: Former Smoker    Years: 0.00    Types: Cigarettes    Quit date: 12/13/1978    Years since quitting: 41.6  . Smokeless tobacco: Never Used  Vaping Use  . Vaping Use: Never used  Substance and Sexual Activity  . Alcohol use: No    Alcohol/week: 0.0 standard drinks  . Drug use: No  . Sexual activity: Not on file  Other Topics Concern  . Not on file  Social History Narrative  .  Not on file   Social Determinants of Health   Financial Resource Strain:   . Difficulty of Paying Living Expenses:   Food Insecurity:   . Worried About Charity fundraiser in the Last Year:   . Arboriculturist in the Last Year:   Transportation Needs:   . Film/video editor (Medical):   Marland Kitchen Lack of Transportation (Non-Medical):   Physical Activity:   . Days of Exercise per Week:   . Minutes of Exercise per Session:   Stress:   . Feeling of Stress :   Social Connections:   . Frequency of Communication with Friends and Family:   . Frequency of Social Gatherings with Friends and Family:   . Attends Religious Services:   . Active Member of Clubs or Organizations:   . Attends English as a second language teacher Meetings:   Marland Kitchen Marital Status:   Intimate Partner Violence:   . Fear of Current or Ex-Partner:   . Emotionally Abused:   Marland Kitchen Physically Abused:   . Sexually Abused:    Family History  Problem Relation Age of Onset  . Diabetes Mother   . Hyperlipidemia Mother   . Hypertension Mother   . Diabetes Father   . Hyperlipidemia Father   . Hypertension Father   . Cancer Brother   . Heart disease Brother   . Hypertension Brother   . Heart attack Brother    Past Surgical History:  Procedure Laterality Date  . carbuncle     back of head  . CESAREAN SECTION    . COLONOSCOPY    . JOINT REPLACEMENT    . KNEE SURGERY     quad  . QUADRICEPS REPAIR    . TOTAL KNEE ARTHROPLASTY Left 08/13/2014   Procedure: TOTAL KNEE ARTHROPLASTY;  [place]: Meredith Pel, MD;  Location: [place];  Service: Orthopedics;  Laterality: Left;     Vanessa Kick, MD 07/25/20 1306

## 2020-07-30 ENCOUNTER — Other Ambulatory Visit: Payer: Self-pay

## 2020-07-30 ENCOUNTER — Ambulatory Visit (INDEPENDENT_AMBULATORY_CARE_PROVIDER_SITE_OTHER): Payer: Medicaid Other | Admitting: Podiatry

## 2020-07-30 ENCOUNTER — Encounter: Payer: Self-pay | Admitting: Podiatry

## 2020-07-30 DIAGNOSIS — L03116 Cellulitis of left lower limb: Secondary | ICD-10-CM | POA: Diagnosis not present

## 2020-07-30 DIAGNOSIS — L97521 Non-pressure chronic ulcer of other part of left foot limited to breakdown of skin: Secondary | ICD-10-CM | POA: Diagnosis not present

## 2020-07-30 DIAGNOSIS — E11621 Type 2 diabetes mellitus with foot ulcer: Secondary | ICD-10-CM

## 2020-07-30 MED ORDER — DOXYCYCLINE HYCLATE 100 MG PO CAPS: 100.0000 mg | ORAL_CAPSULE | Freq: Two times a day (BID) | ORAL | 0 refills | Status: AC

## 2020-07-30 MED ORDER — MUPIROCIN 2 % EX OINT: TOPICAL_OINTMENT | CUTANEOUS | 0 refills | Status: AC

## 2020-07-30 NOTE — Patient Instructions (Addendum)
Follow up with Dr. Sherryle Lis in one week for left 3rd toe ulcer.

## 2020-07-30 NOTE — Progress Notes (Signed)
Subjective:  Patient ID: Kaitlyn Castillo, female    DOB: 1961/07/25,  MRN: [id number]  59 y.o. female is seen for follow up left 3rd digit ulceration. Patient reports h/o recurrent left leg cellulitis. She also has h/o osteomyelitis left 2nd digit treated/healed by Wound Care and hyperbaric oxygen.  She states she went to ED on 07/22/2020 for left leg swelling/pain and left 3rd digit pain. She seems to think the cellulitis of the leg exacerbated her toe symptoms. She developed blister of toe which drained pus. She was prescribed a course of doxycycline and saw Dr. Sherryle Lis the next day. Ulcer was debrided. She states she did not receive Rx for Mupirocin Ointment.  She states her pain in leg and left 3rd digit have resolved. States her left leg and left 3rd digit swelling has significantly decreased.  Patient The patient is having no constitutional symptoms, denying fever, chills, anorexia, or weight loss.  During visit with Dr. Sherryle Lis, they did discuss tenotomy of left 3rd digit to correct hammertoe deformity and she would like to proceed with this after her wound heals to avoid any future wounds.  Past Medical History:  Diagnosis Date  . Arthritis   . Cellulitis of left leg 07/07/2012  . Charcot's joint of foot   . Constipation   . Diabetes mellitus   . Head injury, closed, with brief LOC ([place])   . High cholesterol   . Hypertension   . Knee pain, right   . Obese   . Obesity (BMI 30-39.9) 12/08/2017  . Shortness of breath    with too much fluid- not currently     Past Surgical History:  Procedure Laterality Date  . carbuncle     back of head  . CESAREAN SECTION    . COLONOSCOPY    . JOINT REPLACEMENT    . KNEE SURGERY     quad  . QUADRICEPS REPAIR    . TOTAL KNEE ARTHROPLASTY Left 08/13/2014   Procedure: TOTAL KNEE ARTHROPLASTY;  [place]: Meredith Pel, MD;  Location: [place];  Service: Orthopedics;  Laterality: Left;     Current Outpatient Medications on File Prior to  Visit  Medication Sig Dispense Refill  . ACCU-CHEK AVIVA PLUS test strip 3 (three) times daily. for testing  5  . ACCU-CHEK FASTCLIX LANCETS MISC TEST QID  2  . acetaminophen (TYLENOL) 325 MG tablet Take 650 mg by mouth every 6 (six) hours as needed.    . Acetaminophen-Codeine 300-30 MG tablet Take by mouth.    Marland Kitchen atenolol (TENORMIN) 25 MG tablet Take by mouth.    . diclofenac sodium (VOLTAREN) 1 % GEL Apply 2 g topically 4 (four) times daily. 100 g 0  . Dulaglutide (TRULICITY) 1.5 GU/5.4YH SOPN Inject into the skin.    Marland Kitchen exenatide (BYETTA 10 MCG PEN) 10 MCG/0.04ML SOPN injection Inject 10 mcg into the skin 2 (two) times daily with a meal.    . lisinopril-hydrochlorothiazide (PRINZIDE,ZESTORETIC) 20-12.5 MG tablet Take 1 tablet by mouth daily.  0  . metFORMIN (GLUCOPHAGE) 500 MG tablet TK 1 T PO BID    . metroNIDAZOLE (FLAGYL) 500 MG tablet Take 500 mg by mouth 3 (three) times daily.    . ondansetron (ZOFRAN) 4 MG tablet Take 1 tablet (4 mg total) by mouth every 8 (eight) hours as needed for nausea or vomiting. 12 tablet 0  . ondansetron (ZOFRAN) 8 MG tablet Take 8 mg by mouth 3 (three) times daily.    . phentermine 37.5 MG capsule  Take by mouth.    . RESTASIS 0.05 % ophthalmic emulsion INSTILL 1 DROP OU BID    . simvastatin (ZOCOR) 40 MG tablet Take by mouth.    Marland Kitchen VICTOZA 18 MG/3ML SOPN INJECT 1.8 MG DAILY     No current facility-administered medications on file prior to visit.     Allergies  Allergen Reactions  . Iodine Anaphylaxis and Hives    Certain iodine in fish  . Shellfish Allergy Anaphylaxis    Iodine breaks her out and "stops her breathing"  . Aleve [Naproxen Sodium] Hives  . Iohexol Hives    Can possibly tolerate with Benadryl (but ask first??)   . Percocet [Oxycodone-Acetaminophen] Hives, Itching and Other (See Comments)    Causes paranoia     Family History  Problem Relation Age of Onset  . Diabetes Mother   . Hyperlipidemia Mother   . Hypertension Mother   .  Diabetes Father   . Hyperlipidemia Father   . Hypertension Father   . Cancer Brother   . Heart disease Brother   . Hypertension Brother   . Heart attack Brother      Social History   Occupational History  . Not on file  Tobacco Use  . Smoking status: Former Smoker    Years: 0.00    Types: Cigarettes    Quit date: 12/13/1978    Years since quitting: 41.6  . Smokeless tobacco: Never Used  Vaping Use  . Vaping Use: Never used  Substance and Sexual Activity  . Alcohol use: No    Alcohol/week: 0.0 standard drinks  . Drug use: No  . Sexual activity: Not on file     Immunization History  Administered Date(s) Administered  . Pneumococcal Polysaccharide-23 08/15/2014  . Tdap 07/07/2012    Objective:  Physical Exam: There were no vitals filed for this visit.   Kaitlyn Castillo is a pleasant 59 y.o. African American female morbidly obese in NAD. AAO x 3.  Vascular Examination: Capillary fill time to digits <3 seconds b/l lower extremities. Palpable DP pulse(s) b/l lower extremities Nonpalpable PT pulse(s) b/l lower extremities. Pedal hair absent. Lower extremity skin temperature gradient within normal limits. No pain with calf compression b/l. Trace edema noted b/l lower extremities.  Dermatological Examination: Pedal skin with normal turgor, texture and tone bilaterally. No interdigital macerations bilaterally.  Wound Location: left 3rd toe There is a minimal amount of devitalized tissue present in the wound. Predebridement Wound Measurement:  0.2 x 0.3 x 0.1 cm Postdebridement Wound Measurement: 0.3  x 0.4 x 0.1 cm Wound Base: pink and granular Peri-wound: mild hyperkeratosis Exudate: None: wound tissue dry Blood Loss during debridement: 0 cc('s). Material in wound which inhibits healing/promotes adjacent tissue breakdown:  Mild hyperkeratosis Description of tissue removed from ulceration today:  mild hyperkeratosis Sign(s) of clinical bacterial infection: no clinical  signs of infection noted on examination today.  Musculoskeletal Examination: Normal muscle strength 5/5 to all lower extremity muscle groups bilaterally. No pain crepitus or joint limitation noted with ROM b/l. Hammertoes noted to the 2-5 bilaterally.  Neurological Examination: Protective sensation intact 5/5 intact bilaterally with 10g monofilament b/l. Vibratory sensation intact b/l.   Assessment:   1. 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 Cellulitis of left leg    Plan:  -Examined patient. -Patient was evaluated and treated and all questions answered.  -Patient/POA/Family member educated on diagnosis and treatment plan of  routine ulcer debridement/wound care.  -Full thickness ulceration debridement achieved utilizing sharp excisional debridement with sterile scalpel blade.. Type/amount of devitalized tissue removed: mild hyperkeratosis -Today's ulcer size post-debridement: 0.3 x 0.4 x 0.1 cm, improved. No systemic signs/symptoms of infection. -Ulceration cleansed with wound cleanser. triple antibiotic ointment applied to base of ulceration and secured with light dressing. -Wound responded well to today's debridement. -Patient risk factors affecting healing of ulcer: uncontrolled diabetes, foot deformity, history of previous ulceration -Patient to report any pedal injuries to medical professional immediately. -Prescription written for Mupirocin Ointment. Patient is to apply to left 3rd once daily and cover with dressing.  -Extended Rx for Doxycyline 100 mg, #14, to be taken twice for 7 days. -Patient to continue soft, supportive shoe gear daily. -Patient/POA to call should there be question/concern in the interim.  Return in about 1 week (around 08/06/2020) for one week with Dr. Sherryle Lis.  [name] Board, DPM

## 2020-08-07 ENCOUNTER — Encounter: Payer: Self-pay | Admitting: Podiatry

## 2020-08-07 ENCOUNTER — Ambulatory Visit (INDEPENDENT_AMBULATORY_CARE_PROVIDER_SITE_OTHER): Payer: Medicaid Other | Admitting: Podiatry

## 2020-08-07 ENCOUNTER — Other Ambulatory Visit: Payer: Self-pay

## 2020-08-07 DIAGNOSIS — E11621 Type 2 diabetes mellitus with foot ulcer: Secondary | ICD-10-CM | POA: Diagnosis not present

## 2020-08-07 DIAGNOSIS — L97521 Non-pressure chronic ulcer of other part of left foot limited to breakdown of skin: Secondary | ICD-10-CM | POA: Diagnosis not present

## 2020-08-07 DIAGNOSIS — M2041 Other hammer toe(s) (acquired), right foot: Secondary | ICD-10-CM

## 2020-08-07 DIAGNOSIS — M2042 Other hammer toe(s) (acquired), left foot: Secondary | ICD-10-CM | POA: Diagnosis not present

## 2020-08-07 NOTE — Progress Notes (Signed)
  Subjective:  Patient ID: Kaitlyn Castillo, female    DOB: [birth year],  MRN: [id number]  Chief Complaint  Patient presents with  . Foot Ulcer    left foot 2 week follow up    59 y.o. female presents with the above complaint. History confirmed with patient.  Toe has improved somewhat.  She was referred back to me by Dr. Elisha Ponder for flexor tenotomy.  She is also concerned that the right second toe is beginning to hammer normal, ulcer as well soon.  Objective:  Physical Exam: warm, good capillary refill, no trophic changes or ulcerative lesions, normal DP and PT pulses and normal sensory exam. Left Foot: 3rd digit distal tip callus with small central ulceration, improved in character and depth since last visit Right foot: Semirigid and semi-reducible hammertoe of the second digit with preulcerative callus     Radiographs: X-ray of left foot: no clear evidence of osteomyelitis of 3rd digit Assessment:   1. Hammertoe of left foot   2. Hammertoe of right foot   3. 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  -Wound has been improving.  She is still on doxycycline, agree with continuing this.  Expect with electrocautery the wound will heal completely.  Hammertoe left third, right second with pre-ulcerative callus -Flexor tenotomy as below. -Advised to remove the dressing in 24 hours and apply a band-aid and triple abx ointment every day thereafter.  Procedure: Flexor Tenotomy Indication for Procedure: toe with semi-reducible hammertoe with distal tip ulceration. Flexor tenotomy indicated to alleviate contracture, reduce pressure, and enhance healing of the ulceration. Location: Left third, right second digits Anesthesia: Lidocaine 1% plain; 1.5 mL and Marcaine 0.5% plain; 1.5 mL digital block Instrumentation: 18 g needle  Technique: The toe  was anesthetized as above and prepped in the usual fashion. The toe was exsanquinated and a tourniquet was secured at the base of the toe. An 18g needle was then used to percutaneously release the flexor tendon at the plantar surface of the toe with noted release of the hammertoe deformity. The incision was then dressed with antibiotic ointment and band-aid.  Adhesive bandage splint was applied to dorsiflex the DIPJ. Patient tolerated the procedure well. Dressing: Dry, sterile, compression dressing. Disposition: Patient tolerated procedure well. Patient to return in 2 week for follow-up.  Return in about 2 weeks (around 08/21/2020).

## 2020-08-07 NOTE — Patient Instructions (Signed)
Apply a bandaid and neosporin to the underside of the toe and then splint the toe upward as I showed you for 5 days, then you can begin leaving open to air.

## 2020-08-21 ENCOUNTER — Other Ambulatory Visit: Payer: Self-pay

## 2020-08-21 ENCOUNTER — Ambulatory Visit: Payer: Medicaid Other | Admitting: Podiatry

## 2020-08-21 DIAGNOSIS — L97521 Non-pressure chronic ulcer of other part of left foot limited to breakdown of skin: Secondary | ICD-10-CM

## 2020-08-21 DIAGNOSIS — L84 Corns and callosities: Secondary | ICD-10-CM

## 2020-08-21 DIAGNOSIS — M2041 Other hammer toe(s) (acquired), right foot: Secondary | ICD-10-CM

## 2020-08-21 DIAGNOSIS — M2042 Other hammer toe(s) (acquired), left foot: Secondary | ICD-10-CM

## 2020-08-21 DIAGNOSIS — E11621 Type 2 diabetes mellitus with foot ulcer: Secondary | ICD-10-CM

## 2020-08-23 NOTE — Progress Notes (Signed)
  Subjective:  Patient ID: Kaitlyn Castillo, female    DOB: [birth year],  MRN: [id number]  Chief Complaint  Patient presents with  . Wound Check    pt is here for a wound check of the left 3rd toe, pt states that the toes are painful to the touch.     DOS: 07/23/2020 Procedure: Left third and right second flexor tenotomies  59 y.o. female returns for post-op check.  She is concerned about the straightness of the right second toe.  She is also concerned about the swelling and hyperpigmentation of the left third.  Review of Systems: Negative except as noted in the HPI. Denies N/V/F/Ch.   Objective:  There were no vitals filed for this visit. There is no height or weight on file to calculate BMI. Constitutional Well developed. Well nourished.  Vascular Foot warm and well perfused. Capillary refill normal to all digits.   Neurologic Normal speech. Oriented to person, place, and time.   Dermatologic  both tenotomies sites and the left third toe ulcer has completely healed.  Hyperpigmentation of the left third toe  Orthopedic:  Some edema of the left third toe.  It does still contract plantarly a little bit.  The right second toe is very straight and is difficult to plantarflex completely although it is possible.   Assessment:   1. Hammertoe of left foot   2. Hammertoe of right foot   3. 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 Pre-ulcerative corn or callous    Plan:  Patient was evaluated and treated and all questions answered.  S/p left third and right second digit flexor tenotomies -Progressing as expected post-operatively. -Discussed with her that she can continue and resume her normal activities including exercise and swimming now that her ulcer has healed.  This will help her significantly with recovering from this ulcer and the cellulitis that she had the left leg and she is looking forward to  this. -We will continue to monitor the other digits and if they begin to show signs of preulcerative calluses we will perform flexor tenotomy there as well.  Return in about 5 weeks (around 09/25/2020).

## 2020-09-25 ENCOUNTER — Other Ambulatory Visit: Payer: Self-pay

## 2020-09-25 ENCOUNTER — Encounter: Payer: Self-pay | Admitting: Podiatry

## 2020-09-25 ENCOUNTER — Ambulatory Visit (INDEPENDENT_AMBULATORY_CARE_PROVIDER_SITE_OTHER): Payer: Medicaid Other | Admitting: Podiatry

## 2020-09-25 DIAGNOSIS — M79674 Pain in right toe(s): Secondary | ICD-10-CM

## 2020-09-25 DIAGNOSIS — M2041 Other hammer toe(s) (acquired), right foot: Secondary | ICD-10-CM

## 2020-09-25 DIAGNOSIS — M79675 Pain in left toe(s): Secondary | ICD-10-CM

## 2020-09-25 DIAGNOSIS — M2042 Other hammer toe(s) (acquired), left foot: Secondary | ICD-10-CM

## 2020-09-25 DIAGNOSIS — L84 Corns and callosities: Secondary | ICD-10-CM

## 2020-09-25 DIAGNOSIS — B351 Tinea unguium: Secondary | ICD-10-CM

## 2020-09-25 NOTE — Progress Notes (Signed)
°  Subjective:  Patient ID: Personal assistant, female    DOB: 1961/05/19,  MRN: [id number]  Chief Complaint  Patient presents with   Callouses    PT stated that she is doing good and the only concern she has is about the right foot     DOS: 07/23/2020 Procedure: Left third and right second flexor tenotomies  59 y.o. female returns for post-op check.  She is doing okay.  She has not resumed her exercise in the pool.  Her A1c has increased to 9.4%, however she is now on new meds and will come down soon.  She requests a debridement of the nails and calluses today  Review of Systems: Negative except as noted in the HPI. Denies N/V/F/Ch.   Objective:  There were no vitals filed for this visit. There is no height or weight on file to calculate BMI. Constitutional Well developed. Well nourished.  Vascular Foot warm and well perfused. Capillary refill normal to all digits.   Neurologic Normal speech. Oriented to person, place, and time.   Dermatologic  both tenotomies sites and the left third toe ulcer has completely healed.  Hyperpigmentation of the left third toe has improved.  There is significant improvement in the calluses on both toes.  Onychomycosis x10  Orthopedic:  Third toe slightly contracted on the left side, much improved since preprocedure.  Second toe has more flexion today.   Assessment:   No diagnosis found. Plan:  Patient was evaluated and treated and all questions answered.  S/p left third and right second digit flexor tenotomies -Doing quite well and her ulcers and calluses have healed -I debrided her nails and calluses to her request today, I did not bill them today so that we will not interrupt her regular care with Dr. Elisha Ponder -Resume full activity -Follow-up with Dr. Elisha Ponder in December at her scheduled appointment   No follow-ups on file.

## 2020-11-21 ENCOUNTER — Encounter: Payer: Self-pay | Admitting: Podiatry

## 2020-11-21 ENCOUNTER — Other Ambulatory Visit: Payer: Self-pay

## 2020-11-21 ENCOUNTER — Ambulatory Visit (INDEPENDENT_AMBULATORY_CARE_PROVIDER_SITE_OTHER): Payer: Medicaid Other | Admitting: Podiatry

## 2020-11-21 DIAGNOSIS — E1151 Type 2 diabetes mellitus with diabetic peripheral angiopathy without gangrene: Secondary | ICD-10-CM

## 2020-11-21 DIAGNOSIS — B351 Tinea unguium: Secondary | ICD-10-CM | POA: Diagnosis not present

## 2020-11-21 DIAGNOSIS — Z872 Personal history of diseases of the skin and subcutaneous tissue: Secondary | ICD-10-CM

## 2020-11-21 DIAGNOSIS — M79671 Pain in right foot: Secondary | ICD-10-CM | POA: Diagnosis not present

## 2020-11-21 DIAGNOSIS — F4320 Adjustment disorder, unspecified: Secondary | ICD-10-CM | POA: Insufficient documentation

## 2020-11-21 DIAGNOSIS — M545 Low back pain, unspecified: Secondary | ICD-10-CM | POA: Insufficient documentation

## 2020-11-21 DIAGNOSIS — D649 Anemia, unspecified: Secondary | ICD-10-CM | POA: Insufficient documentation

## 2020-11-21 DIAGNOSIS — M79672 Pain in left foot: Secondary | ICD-10-CM

## 2020-11-21 DIAGNOSIS — E1121 Type 2 diabetes mellitus with diabetic nephropathy: Secondary | ICD-10-CM | POA: Insufficient documentation

## 2020-11-21 DIAGNOSIS — E1165 Type 2 diabetes mellitus with hyperglycemia: Secondary | ICD-10-CM | POA: Insufficient documentation

## 2020-11-21 DIAGNOSIS — F339 Major depressive disorder, recurrent, unspecified: Secondary | ICD-10-CM | POA: Insufficient documentation

## 2020-11-27 ENCOUNTER — Encounter: Payer: Self-pay | Admitting: Podiatry

## 2020-11-27 NOTE — Progress Notes (Signed)
Subjective:  Patient ID: Kaitlyn Castillo, female    DOB: Feb 06, 1961,  MRN: [id number]  59 y.o. female presents with at risk foot care. Patient has history of diabetes and painful thick toenails that are difficult to trim. Pain interferes with ambulation. Aggravating factors include wearing enclosed shoe gear. Pain is relieved with periodic professional debridement..    She has seen Dr. Sherryle Lis and underwent flexor tenotomies of right 2nd and left 3rd digits. She relates no new pedal concerns on today's visit. She is at high risk due to uncontrolled diabetes, h/o ulceration left 3rd digit (healed), h/o osteomyelitis left 2nd digit (healed via [place] and hyperbarics).  PCP: Benito Mccreedy, MD and last visit was: 09/18/2020.  Review of Systems: Negative except as noted in the HPI.  Past Medical History:  Diagnosis Date  . Arthritis   . Cellulitis of left leg 07/07/2012  . Charcot's joint of foot   . Constipation   . Diabetes mellitus   . Head injury, closed, with brief LOC ([place])   . High cholesterol   . Hypertension   . Knee pain, right   . Obese   . Obesity (BMI 30-39.9) 12/08/2017  . Shortness of breath    with too much fluid- not currently   Past Surgical History:  Procedure Laterality Date  . carbuncle     back of head  . CESAREAN SECTION    . COLONOSCOPY    . JOINT REPLACEMENT    . KNEE SURGERY     quad  . QUADRICEPS REPAIR    . TOTAL KNEE ARTHROPLASTY Left 08/13/2014   Procedure: TOTAL KNEE ARTHROPLASTY;  Cavanagh: Meredith Pel, MD;  Location: [facility];  Service: Orthopedics;  Laterality: Left;   Patient Active Problem List   Diagnosis Date Noted  . Adjustment disorder 11/21/2020  . Anemia 11/21/2020  . Diabetic renal disease ([place]) 11/21/2020  . Hyperglycemia due to type 2 diabetes mellitus ([place]) 11/21/2020  . Low back pain 11/21/2020  . Recurrent major depression ([place]) 11/21/2020  . Obesity due to excess calories 02/28/2020  . Obesity (BMI 30-39.9)  12/08/2017  . Streptococcal bacteremia 12/06/2017  . Uncontrolled type 2 diabetes mellitus with hyperglycemia ([place]) 12/06/2017  . Hyponatremia 12/06/2017  . Postmenopausal bleeding 08/27/2015  . Adenomyosis 08/27/2015  . Skin ulcer of left foot including toes ([place]) 05/02/2015  . Equinus deformity of foot, acquired 04/02/2015  . Tenosynovitis of left foot 04/02/2015  . Arthritis of knee 08/13/2014  . Onychomycosis 11/02/2013  . Acquired deformity of toe 11/02/2013  . Unspecified venous (peripheral) insufficiency 09/13/2012  . Cellulitis of left leg 07/07/2012  . ANXIETY 11/19/2007  . DIABETES MELLITUS, TYPE II 07/29/2007  . DEPRESSION 07/29/2007  . Essential hypertension 07/29/2007    Current Outpatient Medications:  .  ACCU-CHEK AVIVA PLUS test strip, 3 (three) times daily. for testing, Disp: , Rfl: 5 .  ACCU-CHEK FASTCLIX LANCETS MISC, TEST QID, Disp: , Rfl: 2 .  acetaminophen (TYLENOL) 325 MG tablet, Take 650 mg by mouth every 6 (six) hours as needed., Disp: , Rfl:  .  Acetaminophen-Codeine 300-30 MG tablet, Take by mouth., Disp: , Rfl:  .  atenolol (TENORMIN) 25 MG tablet, Take by mouth., Disp: , Rfl:  .  atorvastatin (LIPITOR) 40 MG tablet, Take 40 mg by mouth daily., Disp: , Rfl:  .  diclofenac sodium (VOLTAREN) 1 % GEL, Apply 2 g topically 4 (four) times daily., Disp: 100 g, Rfl: 0 .  Dulaglutide (TRULICITY) 1.5 XK/4.8JE SOPN, Inject into  the skin., Disp: , Rfl:  .  exenatide (BYETTA 10 MCG PEN) 10 MCG/0.04ML SOPN injection, Inject 10 mcg into the skin 2 (two) times daily with a meal., Disp: , Rfl:  .  glipiZIDE (GLUCOTROL XL) 5 MG 24 hr tablet, Take 5 mg by mouth daily., Disp: , Rfl:  .  lisinopril-hydrochlorothiazide (PRINZIDE,ZESTORETIC) 20-12.5 MG tablet, Take 1 tablet by mouth daily., Disp: , Rfl: 0 .  metFORMIN (GLUCOPHAGE) 500 MG tablet, TK 1 T PO BID, Disp: , Rfl:  .  metroNIDAZOLE (FLAGYL) 500 MG tablet, Take 500 mg by mouth 3 (three) times daily., Disp: , Rfl:  .   mupirocin ointment (BACTROBAN) 2 %, Apply left 3rd digit ulcer once daily., Disp: 30 g, Rfl: 0 .  ondansetron (ZOFRAN) 4 MG tablet, Take 1 tablet (4 mg total) by mouth every 8 (eight) hours as needed for nausea or vomiting., Disp: 12 tablet, Rfl: 0 .  ondansetron (ZOFRAN) 8 MG tablet, Take 8 mg by mouth 3 (three) times daily., Disp: , Rfl:  .  phentermine 37.5 MG capsule, Take by mouth., Disp: , Rfl:  .  RESTASIS 0.05 % ophthalmic emulsion, INSTILL 1 DROP OU BID, Disp: , Rfl:  .  simvastatin (ZOCOR) 40 MG tablet, Take by mouth., Disp: , Rfl:  .  VICTOZA 18 MG/3ML SOPN, INJECT 1.8 MG DAILY, Disp: , Rfl:  Allergies  Allergen Reactions  . Iodine Anaphylaxis and Hives    Certain iodine in fish  . Red Dye Hives  . Shellfish Allergy Anaphylaxis    Iodine breaks her out and "stops her breathing"  . Aleve [Naproxen Sodium] Hives  . Iohexol Hives    Can possibly tolerate with Benadryl (but ask first??)   . Percocet [Oxycodone-Acetaminophen] Hives, Itching and Other (See Comments)    Causes paranoia   Social History   Tobacco Use  Smoking Status Former Smoker  . Years: 0.00  . Types: Cigarettes  . Quit date: 12/13/1978  . Years since quitting: 41.9  Smokeless Tobacco Never Used    Objective:  There were no vitals filed for this visit. Constitutional Patient is a pleasant 59 y.o. African American female morbidly obese in NAD. AAO x 3.  Vascular Capillary fill time to digits <3 seconds b/l lower extremities. Palpable DP pulse(s) b/l lower extremities Nonpalpable PT pulse(s) b/l lower extremities. Pedal hair absent. Lower extremity skin temperature gradient within normal limits. Trace edema noted b/l lower extremities. No cyanosis or clubbing noted.  Neurologic Normal speech. Protective sensation intact 5/5 intact bilaterally with 10g monofilament b/l. Vibratory sensation intact b/l.  Dermatologic Pedal skin with normal turgor, texture and tone bilaterally. No open wounds bilaterally. No  interdigital macerations bilaterally. Toenails 1-5 b/l elongated, discolored, dystrophic, thickened, crumbly with subungual debris and tenderness to dorsal palpation. Resolved hyperkeratosis right 2nd digit. Ulceration healed left 3rd digit. Mild hyperkeratosis distally, much improved.  Orthopedic: Normal muscle strength 5/5 to all lower extremity muscle groups bilaterally. No pain crepitus or joint limitation noted with ROM b/l. Hammertoes noted to the L 2nd toe, L 4th toe, L 5th toe, R 3rd toe, R 4th toe and R 5th toe. Improvement in contracture of left 3rd digit. Right 2nd digit straight with resolved hyperkeratosis.    Assessment:   1. Onychomycosis   2. Healed ulcer of left foot   3. Type II diabetes mellitus with peripheral circulatory disorder [facility])    Plan:  Patient was evaluated and treated and all questions answered.  Onychomycosis with pain -Nails palliatively debridement  as below. -Educated on self-care  Procedure: Nail Debridement Rationale: Pain Type of Debridement: manual, sharp debridement. Instrumentation: Nail nipper, rotary burr. Number of Nails: 10  -Examined patient. -Continue diabetic foot care principles. -Patient to continue soft, supportive shoe gear daily. -Toenails 1-5 b/l were debrided in length and girth with sterile nail nippers and dremel without iatrogenic bleeding.  -Mild hyperkeratosis pared left 3rd digit. -Patient to report any pedal injuries to medical professional immediately. -Patient/POA to call should there be question/concern in the interim.  Return in about 9 weeks (around 01/23/2021).  [name] Board, DPM

## 2021-01-23 ENCOUNTER — Other Ambulatory Visit: Payer: Self-pay | Admitting: Internal Medicine

## 2021-01-23 DIAGNOSIS — R5381 Other malaise: Secondary | ICD-10-CM

## 2021-01-27 ENCOUNTER — Ambulatory Visit: Payer: Medicaid Other | Admitting: Podiatry

## 2021-02-11 ENCOUNTER — Ambulatory Visit (INDEPENDENT_AMBULATORY_CARE_PROVIDER_SITE_OTHER): Payer: Medicaid Other | Admitting: Orthopedic Surgery

## 2021-02-11 ENCOUNTER — Other Ambulatory Visit: Payer: Self-pay

## 2021-02-11 ENCOUNTER — Ambulatory Visit (INDEPENDENT_AMBULATORY_CARE_PROVIDER_SITE_OTHER): Payer: Medicaid Other

## 2021-02-11 DIAGNOSIS — M25512 Pain in left shoulder: Secondary | ICD-10-CM

## 2021-02-11 NOTE — Progress Notes (Signed)
Office Visit Note   Patient: Kaitlyn Castillo           Date of Birth: 30-Jul-1961           MRN: [id number] Visit Date: 02/11/2021 Requested by: Benito Mccreedy, MD [address],  [place] [address] PCP: Benito Mccreedy, MD  Subjective: Chief Complaint  Patient presents with  . Left Shoulder - Pain    HPI: Kaitlyn Castillo is a 60 y.o. female who presents to the office complaining of left shoulder pain.  Patient complains of several years of left shoulder pain since prior to the pandemic.  She states that she is an avid weightlifter and this pain is worse with lifting weights.  It does not prevent her from lifting weights but does prevent her from "going all out".  Localizes pain to the lateral aspect of the left shoulder as well as the left axilla.  She complains of decreased range of motion compared with her contralateral shoulder.  Denies any radiation of pain down her arm, numbness/tingling, neck pain.  No history of left shoulder surgery or neck surgery.  Pain does not wake her up at night.  Denies any right-sided symptoms.  She enjoys free weight exercises, bench press, overhead press.  She does have history of diabetes with last A1c 7.0.  Denies any history of smoking since she was in her 76s.  She has had a prior DEXA scan when she was 7 that was found to be clear of any osteopenia or osteoporosis.  She uses Voltaren gel for her symptoms..                ROS: All systems reviewed are negative as they relate to the chief complaint within the history of present illness.  Patient denies fevers or chills.  Assessment & Plan: Visit Diagnoses:  1. Left shoulder pain, unspecified chronicity     Plan: Patient is a 60 year old female who presents complaining of left shoulder pain.  She has had a long history of left shoulder pain for several years that is worse with exercising, particularly strength training.  He does not prevent her from working out but is worse  with lifting weights and prevents her from reaching her full capacity in the gym.  No radicular pain.  Left shoulder radiographs show no significant joint space narrowing glenohumeral joint and no specific findings to explain her pain.  No weakness on exam today.  She does have some positive impingement signs.  With long history of pain and negative radiographs, plan to obtain MRI arthrogram of the left shoulder to rule out SLAP tear or occult rotator cuff tear.  Patient agreed with plan.  Also order DEXA scan as it has been almost 10 years since her last scan and she wants to ensure that she is not developing osteopenia or osteoporosis.  Follow-up after scans to review results.  Follow-Up Instructions: No follow-ups on file.   Orders:  Orders Placed This Encounter  Procedures  . XR Shoulder Left  . MR Shoulder Left w/ contrast  . Arthrogram  . DG BONE DENSITY (DXA)   No orders of the defined types were placed in this encounter.     Procedures: No procedures performed   Clinical Data: No additional findings.  Objective: Vital Signs: LMP 08/22/2015   Physical Exam:  Constitutional: Patient appears well-developed HEENT:  Head: Normocephalic Eyes:EOM are normal Neck: Normal range of motion Cardiovascular: Normal rate Pulmonary/chest: Effort normal Neurologic: Patient is alert  Skin: Skin is warm Psychiatric: Patient has normal mood and affect  Ortho Exam:  Passive ROM Right Left  External Rotation  45  50  Abduction  115  90  Forward Flexion  175  160  No crepitus with ROM 5/5 motor strength of bilateral supraspinatus, infraspinatus, subscapularis 5/5 motor strength of grip strength, finger abduction, pronation/supination, bicep, tricep, deltoid bilaterally No tenderness to palpation over the [facility] joint.  Mild tenderness over the bicipital groove of the left shoulder. Positive Neers and hawkins impingement signs.    Negative drop arm test.  Negative Hornblower sign. No pain  with c-spine ROM.  Negative Spurling sign.  Specialty Comments:  No specialty comments available.  Imaging: No results found.   PMFS History: Patient Active Problem List   Diagnosis Date Noted  . Adjustment disorder 11/21/2020  . Anemia 11/21/2020  . Diabetic renal disease ([place]) 11/21/2020  . Hyperglycemia due to type 2 diabetes mellitus ([place]) 11/21/2020  . Low back pain 11/21/2020  . Recurrent major depression ([place]) 11/21/2020  . Obesity due to excess calories 02/28/2020  . Obesity (BMI 30-39.9) 12/08/2017  . Streptococcal bacteremia 12/06/2017  . Uncontrolled type 2 diabetes mellitus with hyperglycemia ([place]) 12/06/2017  . Hyponatremia 12/06/2017  . Postmenopausal bleeding 08/27/2015  . Adenomyosis 08/27/2015  . Skin ulcer of left foot including toes ([place]) 05/02/2015  . Equinus deformity of foot, acquired 04/02/2015  . Tenosynovitis of left foot 04/02/2015  . Arthritis of knee 08/13/2014  . Onychomycosis 11/02/2013  . Acquired deformity of toe 11/02/2013  . Unspecified venous (peripheral) insufficiency 09/13/2012  . Cellulitis of left leg 07/07/2012  . ANXIETY 11/19/2007  . DIABETES MELLITUS, TYPE II 07/29/2007  . DEPRESSION 07/29/2007  . Essential hypertension 07/29/2007   Past Medical History:  Diagnosis Date  . Arthritis   . Cellulitis of left leg 07/07/2012  . Charcot's joint of foot   . Constipation   . Diabetes mellitus   . Head injury, closed, with brief LOC ([place])   . High cholesterol   . Hypertension   . Knee pain, right   . Obese   . Obesity (BMI 30-39.9) 12/08/2017  . Shortness of breath    with too much fluid- not currently    Family History  Problem Relation Age of Onset  . Diabetes Mother   . Hyperlipidemia Mother   . Hypertension Mother   . Diabetes Father   . Hyperlipidemia Father   . Hypertension Father   . Cancer Brother   . Heart disease Brother   . Hypertension Brother   . Heart attack Brother     Past Surgical History:  Procedure  Laterality Date  . carbuncle     back of head  . CESAREAN SECTION    . COLONOSCOPY    . JOINT REPLACEMENT    . KNEE SURGERY     quad  . QUADRICEPS REPAIR    . TOTAL KNEE ARTHROPLASTY Left 08/13/2014   Procedure: TOTAL KNEE ARTHROPLASTY;  [place]: Meredith Pel, MD;  Location: [facility];  Service: Orthopedics;  Laterality: Left;   Social History   Occupational History  . Not on file  Tobacco Use  . Smoking status: Former Smoker    Years: 0.00    Types: Cigarettes    Quit date: 12/13/1978    Years since quitting: 42.1  . Smokeless tobacco: Never Used  Vaping Use  . Vaping Use: Never used  Substance and Sexual Activity  . Alcohol use: No    Alcohol/week:  0.0 standard drinks  . Drug use: No  . Sexual activity: Not on file

## 2021-02-12 ENCOUNTER — Telehealth: Payer: Self-pay

## 2021-02-12 NOTE — Telephone Encounter (Signed)
Pt reported she does not want to take prednisone for 13 hour prep and has done well with just benadryl for itching only reaction to iohexol allergy in the past.  OK to take 50mg  benadryl 1 hour before arthrogram on 03/10/21 per Dr Maree Erie.  Pt voiced understanding.  She plans on having a driver that day.

## 2021-02-14 ENCOUNTER — Encounter: Payer: Self-pay | Admitting: Orthopedic Surgery

## 2021-02-18 ENCOUNTER — Other Ambulatory Visit: Payer: Self-pay | Admitting: Internal Medicine

## 2021-02-18 DIAGNOSIS — Z1231 Encounter for screening mammogram for malignant neoplasm of breast: Secondary | ICD-10-CM

## 2021-03-10 ENCOUNTER — Inpatient Hospital Stay: Admission: RE | Admit: 2021-03-10 | Payer: Medicaid Other | Source: Ambulatory Visit

## 2021-03-10 ENCOUNTER — Other Ambulatory Visit: Payer: Medicaid Other

## 2021-03-23 ENCOUNTER — Ambulatory Visit
Admission: RE | Admit: 2021-03-23 | Discharge: 2021-03-23 | Disposition: A | Discharge status: Home / Self Care | Payer: Medicaid Other | Source: Ambulatory Visit | Attending: Orthopedic Surgery | Admitting: Orthopedic Surgery

## 2021-03-23 ENCOUNTER — Other Ambulatory Visit: Payer: Self-pay

## 2021-03-23 DIAGNOSIS — M25512 Pain in left shoulder: Secondary | ICD-10-CM

## 2021-03-25 ENCOUNTER — Other Ambulatory Visit: Payer: Self-pay

## 2021-03-25 ENCOUNTER — Encounter: Payer: Self-pay | Admitting: Podiatry

## 2021-03-25 ENCOUNTER — Ambulatory Visit: Payer: Medicaid Other | Admitting: Podiatry

## 2021-03-25 DIAGNOSIS — B351 Tinea unguium: Secondary | ICD-10-CM | POA: Diagnosis not present

## 2021-03-25 DIAGNOSIS — M79676 Pain in unspecified toe(s): Secondary | ICD-10-CM | POA: Diagnosis not present

## 2021-03-25 DIAGNOSIS — M79674 Pain in right toe(s): Secondary | ICD-10-CM

## 2021-03-25 DIAGNOSIS — M79675 Pain in left toe(s): Secondary | ICD-10-CM

## 2021-03-25 DIAGNOSIS — E1151 Type 2 diabetes mellitus with diabetic peripheral angiopathy without gangrene: Secondary | ICD-10-CM | POA: Diagnosis not present

## 2021-03-25 NOTE — Progress Notes (Signed)
This patient returns to my office for at risk foot care.  This patient requires this care by a professional since this patient will be at risk due to having type 2 diabetes and kidney disease.  This patient is unable to cut nails herself since the patient cannot reach her nails.These nails are painful walking and wearing shoes.  This patient presents for at risk foot care today.  General Appearance  Alert, conversant and in no acute stress.  Vascular  Dorsalis pedis  are palpable  bilaterally. Posterior tibial pulses are absent  B/L. Capillary return is within normal limits  bilaterally. Temperature is within normal limits  bilaterally.  Neurologic  Senn-Weinstein monofilament wire test within normal limits  bilaterally. Muscle power within normal limits bilaterally.  Nails Thick disfigured discolored nails with subungual debris  from hallux to fifth toes bilaterally. No evidence of bacterial infection or drainage bilaterally.  Orthopedic  No limitations of motion  feet .  No crepitus or effusions noted.  Hammer toes  B/L.  Skin  normotropic skin with no porokeratosis noted bilaterally.  No signs of infections or ulcers noted.     Onychomycosis  Pain in right toes  Pain in left toes  Consent was obtained for treatment procedures.   Mechanical debridement of nails 1-5  bilaterally performed with a nail nipper.  Filed with dremel without incident.    Return office visit   3 months                   Told patient to return for periodic foot care and evaluation due to potential at risk complications.   Gardiner Barefoot DPM

## 2021-04-13 ENCOUNTER — Ambulatory Visit: Payer: Medicaid Other

## 2021-06-23 ENCOUNTER — Ambulatory Visit
Admission: RE | Admit: 2021-06-23 | Discharge: 2021-06-23 | Disposition: A | Discharge status: Home / Self Care | Payer: Medicaid Other | Source: Ambulatory Visit | Attending: Internal Medicine | Admitting: Internal Medicine

## 2021-06-23 ENCOUNTER — Other Ambulatory Visit: Payer: Self-pay

## 2021-06-23 DIAGNOSIS — Z1231 Encounter for screening mammogram for malignant neoplasm of breast: Secondary | ICD-10-CM

## 2021-10-12 ENCOUNTER — Encounter: Payer: Self-pay | Admitting: Podiatry

## 2021-10-12 ENCOUNTER — Other Ambulatory Visit: Payer: Self-pay

## 2021-10-12 ENCOUNTER — Ambulatory Visit (INDEPENDENT_AMBULATORY_CARE_PROVIDER_SITE_OTHER): Payer: Medicaid Other | Admitting: Podiatry

## 2021-10-12 DIAGNOSIS — B351 Tinea unguium: Secondary | ICD-10-CM | POA: Diagnosis not present

## 2021-10-12 DIAGNOSIS — E785 Hyperlipidemia, unspecified: Secondary | ICD-10-CM | POA: Insufficient documentation

## 2021-10-12 DIAGNOSIS — L84 Corns and callosities: Secondary | ICD-10-CM | POA: Diagnosis not present

## 2021-10-12 DIAGNOSIS — E1151 Type 2 diabetes mellitus with diabetic peripheral angiopathy without gangrene: Secondary | ICD-10-CM | POA: Diagnosis not present

## 2021-10-16 NOTE — Progress Notes (Signed)
  Subjective:  Patient ID: Personal assistant, female    DOB: [birth year],  MRN: [id number]  Markham Limbert presents to clinic today for at risk foot care. Pt has h/o NIDDM with PAD, painful thick toenails that are difficult to trim. Pain interferes with ambulation. Aggravating factors include wearing enclosed shoe gear. Pain is relieved with periodic professional debridement., and preulcerative lesions distal tip left 2nd and left 3rd toe.  PCP is Osei-Bonsu, Iona Beard, MD , and last visit was 08/28/2021.  Allergies  Allergen Reactions   Iodine Anaphylaxis and Hives    Certain iodine in fish   Red Dye Hives   Shellfish Allergy Anaphylaxis    Iodine breaks her out and "stops her breathing" Iodine breaks her out and "stops her breathing"   Aleve [Naproxen Sodium] Hives   Iohexol Itching    Can possibly tolerate with Benadryl (but ask first??)    Percocet [Oxycodone-Acetaminophen] Hives, Itching and Other (See Comments)    Causes paranoia    Review of Systems: Negative except as noted in the HPI. Objective:   Constitutional Personal assistant is a pleasant 60 y.o. African American female, obese in NAD. AAO x 3.   Vascular CFT <3 seconds b/l LE. Faintly palpable DP pulses b/l. Nonpalpable PT pulses b/l. Pedal hair absent b/l. Skin temperature gradient WNL b/l. No pain with calf compression b/l. Trace edema b/l LE. Lower extremity skin temperature gradient within normal limits. No cyanosis or clubbing noted.  Neurologic Normal speech. Oriented to person, place, and time. Protective sensation intact 5/5 intact bilaterally with 10g monofilament b/l. Vibratory sensation intact b/l.  Dermatologic Toenails 1-5 b/l elongated, discolored, dystrophic, thickened, crumbly with subungual debris and tenderness to dorsal palpation.  Orthopedic: Normal muscle strength 5/5 to all lower extremity muscle groups bilaterally. Pes planus deformity noted b/l lower extremities.   Radiographs: None Assessment:    1. Onychomycosis   2. Corns   3. Type II diabetes mellitus with peripheral circulatory disorder [facility])    Plan:  Patient was evaluated and treated and all questions answered. Consent given for treatment as described below: -No new findings. No new orders. -Toenails 1-5 b/l were debrided in length and girth with sterile nail nippers and dremel without iatrogenic bleeding.  -Preulcerative lesion pared L 2nd toe and L 3rd toe. Total number pared=2. -Patient/POA to call should there be question/concern in the interim.  Return in about 3 months (around 01/12/2022).  [name] Board, DPM

## 2022-01-22 ENCOUNTER — Other Ambulatory Visit: Payer: Self-pay

## 2022-01-22 ENCOUNTER — Ambulatory Visit: Payer: Medicaid Other | Admitting: Podiatry

## 2022-01-22 ENCOUNTER — Encounter: Payer: Self-pay | Admitting: Podiatry

## 2022-01-22 DIAGNOSIS — L89629 Pressure ulcer of left heel, unspecified stage: Secondary | ICD-10-CM

## 2022-01-22 DIAGNOSIS — L89892 Pressure ulcer of other site, stage 2: Secondary | ICD-10-CM

## 2022-01-22 DIAGNOSIS — E1151 Type 2 diabetes mellitus with diabetic peripheral angiopathy without gangrene: Secondary | ICD-10-CM

## 2022-01-22 DIAGNOSIS — B351 Tinea unguium: Secondary | ICD-10-CM

## 2022-01-22 DIAGNOSIS — L84 Corns and callosities: Secondary | ICD-10-CM | POA: Diagnosis not present

## 2022-01-22 DIAGNOSIS — M79676 Pain in unspecified toe(s): Secondary | ICD-10-CM | POA: Diagnosis not present

## 2022-01-28 NOTE — Progress Notes (Signed)
Subjective: Kaitlyn Castillo is a 61 y.o. female patient seen today for follow up of  at risk foot care. Pt has h/o NIDDM with PAD and painful elongated mycotic toenails 1-5 bilaterally which are tender when wearing enclosed shoe gear. Pain is relieved with periodic professional debridement..   Patient states their blood glucose was 218 mg/dl today.   New problem(s)/concern(s) today:   Development of lesion distal tip left 2nd toe. She has h/o ulceration with bone involvement and this has been debrided in the past by another MD  PCP is Benito Mccreedy, MD. Last visit was: 01/06/2022.  Allergies  Allergen Reactions   Iodine Anaphylaxis and Hives    Certain iodine in fish   Red Dye Hives   Shellfish Allergy Anaphylaxis    Iodine breaks her out and "stops her breathing" Iodine breaks her out and "stops her breathing"   Aleve [Naproxen Sodium] Hives   Iohexol Itching    Can possibly tolerate with Benadryl (but ask first??)    Percocet [Oxycodone-Acetaminophen] Hives, Itching and Other (See Comments)    Causes paranoia    Objective: Physical Exam  General: Patient is a pleasant 61 y.o. African American female morbidly obese in NAD. AAO x 3.   Neurovascular Examination: CFT <3 seconds b/l LE. Faintly palpable DP pulses b/l LE. Diminished PT pulse(s) b/l LE. Pedal hair absent. No pain with calf compression b/l. Lower extremity skin temperature gradient within normal limits. Trace edema noted BLE. No ischemia or gangrene noted b/l LE. No cyanosis or clubbing noted b/l LE.  Protective sensation intact 5/5 intact bilaterally with 10g monofilament b/l. Vibratory sensation intact b/l.  Dermatological:  No open wounds b/l LE. No interdigital macerations noted b/l LE. Preulcerative lesion noted L 2nd toe. There is visible subdermal hemorrhage. Lesion is partial thickness. There is no surrounding erythema, no edema, no drainage, no odor, no fluctuance.  Musculoskeletal:  Muscle strength  5/5 to all lower extremity muscle groups bilaterally. Clawtoe deformity L 2nd toe. Pes planus deformity noted bilateral LE.  Assessment: 1. Onychomycosis   2. Pressure injury of toe of left foot, stage 2 ([place])   3. Type II diabetes mellitus with peripheral circulatory disorder [facility])    Plan: Patient was evaluated and treated and all questions answered. Consent given for treatment as described below: -Examined patient. -Mycotic toenails 1-5 bilaterally were debrided in length and girth with sterile nail nippers and dremel without incident. -Partial thicknes lesion pared L 2nd toe. Total number pared=1. -Patient/POA to call should there be question/concern in the interim.  Return in about 3 months (around 04/21/2022).  [name] Board, DPM

## 2022-03-03 ENCOUNTER — Encounter: Payer: Self-pay | Admitting: Gastroenterology

## 2022-03-22 ENCOUNTER — Ambulatory Visit (AMBULATORY_SURGERY_CENTER): Payer: Medicaid Other

## 2022-03-22 VITALS — Ht 64.0 in | Wt 250.0 lb

## 2022-03-22 DIAGNOSIS — Z1211 Encounter for screening for malignant neoplasm of colon: Secondary | ICD-10-CM

## 2022-03-22 MED ORDER — PEG 3350-KCL-NA BICARB-NACL 420 G PO SOLR: 4000.0000 mL | Freq: Once | ORAL | 0 refills | Status: AC

## 2022-03-22 NOTE — Progress Notes (Signed)
No egg or soy allergy known to patient  ?No issues known to pt with past sedation with any surgeries or procedures ?Patient denies ever being told they had issues or difficulty with intubation  ?No FH of Malignant Hyperthermia ?Pt is not on diet pills ?Pt is not on  home 02  ?Pt is not on blood thinners  ?Pt denies issues with constipation  ?No A fib or A flutter  ? ? ?PV completed over the phone. Pt verified name, DOB, address and insurance during PV today.  ?Pt mailed instruction packet with copy of consent form to read and not return, and instructions.  ?Pt encouraged to call with questions or issues.  ?If pt has My chart, procedure instructions sent via My Chart  ? ?

## 2022-03-30 ENCOUNTER — Ambulatory Visit: Payer: Medicaid Other | Admitting: Obstetrics

## 2022-04-06 ENCOUNTER — Encounter: Payer: Self-pay | Admitting: Gastroenterology

## 2022-04-06 ENCOUNTER — Ambulatory Visit (AMBULATORY_SURGERY_CENTER): Payer: Medicaid Other | Admitting: Gastroenterology

## 2022-04-06 VITALS — BP 132/72 | HR 64 | Temp 97.8°F | Resp 22 | Ht 64.0 in | Wt 250.0 lb

## 2022-04-06 DIAGNOSIS — D122 Benign neoplasm of ascending colon: Secondary | ICD-10-CM

## 2022-04-06 DIAGNOSIS — Z1211 Encounter for screening for malignant neoplasm of colon: Secondary | ICD-10-CM | POA: Diagnosis not present

## 2022-04-06 MED ORDER — SODIUM CHLORIDE 0.9 % IV SOLN: 500.0000 mL | Freq: Once | INTRAVENOUS | Status: DC

## 2022-04-06 NOTE — Progress Notes (Signed)
Pt's states no medical or surgical changes since previsit or office visit. 

## 2022-04-06 NOTE — Progress Notes (Signed)
History and Physical: ? This patient presents for endoscopic testing for: ?Encounter Diagnosis  ?Name Primary?  ? Special screening for malignant neoplasms, colon Yes  ? ? ?Reports last colonoscopy 10 years ago with another GI practice in [place]. ?Patient denies chronic abdominal pain, rectal bleeding, constipation or diarrhea. ? ? ?ROS: ?Patient denies chest pain or shortness of breath ? ? ?Past Medical History: ?Past Medical History:  ?Diagnosis Date  ? Arthritis   ? Cellulitis of left leg 07/07/2012  ? Charcot's joint of foot   ? Constipation   ? Diabetes mellitus   ? Head injury, closed, with brief LOC ([place])   ? High cholesterol   ? Hypertension   ? Knee pain, right   ? Obese   ? Obesity (BMI 30-39.9) 12/08/2017  ? Shortness of breath   ? with too much fluid- not currently  ? ? ? ?Past Surgical History: ?Past Surgical History:  ?Procedure Laterality Date  ? carbuncle    ? back of head  ? CESAREAN SECTION    ? COLONOSCOPY    ? JOINT REPLACEMENT    ? KNEE SURGERY    ? quad  ? QUADRICEPS REPAIR    ? TOTAL KNEE ARTHROPLASTY Left 08/13/2014  ? Procedure: TOTAL KNEE ARTHROPLASTY;  Burr: Meredith Pel, MD;  Location: [facility];  Service: Orthopedics;  Laterality: Left;  ? ? ?Allergies: ?Allergies  ?Allergen Reactions  ? Iodine Anaphylaxis and Hives  ?  Certain iodine in fish  ? Red Dye Hives  ? Shellfish Allergy Anaphylaxis  ?  Iodine breaks her out and "stops her breathing" ?Iodine breaks her out and "stops her breathing"  ? Aleve [Naproxen Sodium] Hives  ? Iohexol Itching  ?  Can possibly tolerate with Benadryl (but ask first??) ?  ? Percocet [Oxycodone-Acetaminophen] Hives, Itching and Other (See Comments)  ?  Causes paranoia  ? ? ?Outpatient Meds: ?Current Outpatient Medications  ?Medication Sig Dispense Refill  ? ACCU-CHEK AVIVA PLUS test strip 3 (three) times daily. for testing  5  ? ACCU-CHEK FASTCLIX LANCETS MISC TEST QID  2  ? Dulaglutide (TRULICITY) 3 HW/3.8UE SOPN 3 mg subcutaneously once a week    ?  glipiZIDE (GLUCOTROL XL) 5 MG 24 hr tablet Take 5 mg by mouth daily.    ? lisinopril-hydrochlorothiazide (ZESTORETIC) 20-25 MG tablet Take 2 tablets by mouth daily.    ? metFORMIN (GLUCOPHAGE) 500 MG tablet TK 1 T PO BID    ? acetaminophen (TYLENOL) 325 MG tablet Take 650 mg by mouth every 6 (six) hours as needed.    ? Acetaminophen-Codeine 300-30 MG tablet Take by mouth. (Patient not taking: Reported on 04/06/2022)    ? albuterol (VENTOLIN HFA) 108 (90 Base) MCG/ACT inhaler INHALE 2 PUFFS BY MOUTH EVERY 6 HOURS (Patient not taking: Reported on 03/22/2022)    ? atenolol (TENORMIN) 25 MG tablet Take by mouth. (Patient not taking: Reported on 04/06/2022)    ? atorvastatin (LIPITOR) 40 MG tablet Take 40 mg by mouth daily. (Patient not taking: Reported on 03/22/2022)    ? diclofenac Sodium (VOLTAREN) 1 % GEL Apply 2 g topically 4 (four) times daily.    ? ?Current Facility-Administered Medications  ?Medication Dose Route Frequency Provider Last Rate Last Admin  ? 0.9 %  sodium chloride infusion  500 mL Intravenous Once Doran Stabler, MD      ? ? ? ? ?___________________________________________________________________ ?Objective  ? ?Exam: ? ?BP (!) 143/72   Pulse 69   Temp 97.8 ?  F (36.6 ?C) (Temporal)   Ht '5\' 4"'$  (1.626 m)   Wt 250 lb (113.4 kg)   LMP 08/22/2015   SpO2 100%   BMI 42.91 kg/m?  ? ?CV: RRR without murmur, S1/S2 ?Resp: clear to auscultation bilaterally, normal RR and effort noted ?GI: soft, no tenderness, with active bowel sounds. ? ? ?Assessment: ?Encounter Diagnosis  ?Name Primary?  ? Special screening for malignant neoplasms, colon Yes  ? ? ? ?Plan: ?Colonoscopy ? The benefits and risks of the planned procedure were described in detail with the patient or (when appropriate) their health care proxy.  Risks were outlined as including, but not limited to, bleeding, infection, perforation, adverse medication reaction leading to cardiac or pulmonary decompensation, pancreatitis (if ERCP).  The limitation  of incomplete mucosal visualization was also discussed.  No guarantees or warranties were given. ? ? ? ?The patient is appropriate for an endoscopic procedure in the ambulatory setting. ? ? - Wilfrid Lund, MD ? ? ? ? ?

## 2022-04-06 NOTE — Op Note (Signed)
[facility] ?Patient Name: Kaitlyn Castillo ?Procedure Date: 04/06/2022 2:43 PM ?MRN: [id number] ?Endoscopist: Estill Cotta. Loletha Carrow , MD ?Age: 61 ?Referring MD:  ?Date of Birth: 04-Aug-1961 ?Gender: Female ?Account #: [id number] ?Procedure:                Colonoscopy ?Indications:              Screening for colorectal malignant neoplasm ?                          Patent reports normal colonoscopy 10 years prior at  ?                          outside practice ?Medicines:                Monitored Anesthesia Care ?Procedure:                Pre-Anesthesia Assessment: ?                          - Prior to the procedure, a History and Physical  ?                          was performed, and patient medications and  ?                          allergies were reviewed. The patient's tolerance of  ?                          previous anesthesia was also reviewed. The risks  ?                          and benefits of the procedure and the sedation  ?                          options and risks were discussed with the patient.  ?                          All questions were answered, and informed consent  ?                          was obtained. Prior Anticoagulants: The patient has  ?                          taken no previous anticoagulant or antiplatelet  ?                          agents. ASA Grade Assessment: III - A patient with  ?                          severe systemic disease. After reviewing the risks  ?                          and benefits, the patient was deemed in  ?  satisfactory condition to undergo the procedure. ?                          After obtaining informed consent, the colonoscope  ?                          was passed under direct vision. Throughout the  ?                          procedure, the patient's blood pressure, pulse, and  ?                          oxygen saturations were monitored continuously. The  ?                          CF [id number] #[id number] was introduced through  the anus  ?                          and advanced to the the cecum, identified by  ?                          appendiceal orifice and ileocecal valve. The  ?                          colonoscopy was performed with difficulty due to a  ?                          redundant colon, significant looping and the  ?                          patient's body habitus. Successful completion of  ?                          the procedure was aided by changing the patient to  ?                          a semi-prone position, using manual pressure,  ?                          straightening and shortening the scope to obtain  ?                          bowel loop reduction and lavage. The patient  ?                          tolerated the procedure well. The quality of the  ?                          bowel preparation was good after lavage (scattered  ?                          fibrous debris remained, esp in right colon). The  ?  ileocecal valve, appendiceal orifice, and rectum  ?                          were photographed. The bowel preparation used was  ?                          GoLYTELY. ?Scope In: 2:49:17 PM ?Scope Out: 3:16:13 PM ?Scope Withdrawal Time: 0 hours 7 minutes 54 seconds  ?Total Procedure Duration: 0 hours 26 minutes 56 seconds  ?Findings:                 The digital rectal exam findings include decreased  ?                          sphincter tone. ?                          A diminutive polyp was found in the ascending  ?                          colon. The polyp was semi-sessile. The polyp was  ?                          removed with a cold snare. Resection and retrieval  ?                          were complete. ?                          The colon (entire examined portion) was  ?                          significantly redundant. ?                          The exam was otherwise without abnormality on  ?                          direct and retroflexion views. ?Complications:            No  immediate complications. ?Estimated Blood Loss:     Estimated blood loss was minimal. ?Impression:               - Decreased sphincter tone found on digital rectal  ?                          exam. ?                          - One diminutive polyp in the ascending colon,  ?                          removed with a cold snare. Resected and retrieved. ?                          - Redundant colon. ?                          -  The examination was otherwise normal on direct  ?                          and retroflexion views. ?Recommendation:           - Patient has a contact number available for  ?                          emergencies. The signs and symptoms of potential  ?                          delayed complications were discussed with the  ?                          patient. Return to normal activities tomorrow.  ?                          Written discharge instructions were provided to the  ?                          patient. ?                          - Resume previous diet. ?                          - Continue present medications. ?                          - Await pathology results. ?                          - Repeat colonoscopy is recommended for  ?                          surveillance. The colonoscopy date will be  ?                          determined after pathology results from today's  ?                          exam become available for review. ?Janyah Singleterry L. Loletha Carrow, MD ?04/06/2022 3:24:08 PM ?This report has been signed electronically. ?

## 2022-04-06 NOTE — Progress Notes (Signed)
A and O x3. Report to RN. Tolerated MAC anesthesia well. 

## 2022-04-06 NOTE — Patient Instructions (Signed)
Please read handouts provided. Continue present medications. Await pathology results.   YOU HAD AN ENDOSCOPIC PROCEDURE TODAY AT THE [facility]:   Refer to the procedure report that was given to you for any specific questions about what was found during the examination.  If the procedure report does not answer your questions, please call your gastroenterologist to clarify.  If you requested that your care partner not be given the details of your procedure findings, then the procedure report has been included in a sealed envelope for you to review at your convenience later.  YOU SHOULD EXPECT: Some feelings of bloating in the abdomen. Passage of more gas than usual.  Walking can help get rid of the air that was put into your GI tract during the procedure and reduce the bloating. If you had a lower endoscopy (such as a colonoscopy or flexible sigmoidoscopy) you may notice spotting of blood in your stool or on the toilet paper. If you underwent a bowel prep for your procedure, you may not have a normal bowel movement for a few days.  Please Note:  You might notice some irritation and congestion in your nose or some drainage.  This is from the oxygen used during your procedure.  There is no need for concern and it should clear up in a day or so.  SYMPTOMS TO REPORT IMMEDIATELY:  Following lower endoscopy (colonoscopy or flexible sigmoidoscopy):  Excessive amounts of blood in the stool  Significant tenderness or worsening of abdominal pains  Swelling of the abdomen that is new, acute  Fever of 100F or higher   For urgent or emergent issues, a gastroenterologist can be reached at any hour by calling [phone]. Do not use MyChart messaging for urgent concerns.    DIET:  We do recommend a small meal at first, but then you may proceed to your regular diet.  Drink plenty of fluids but you should avoid alcoholic beverages for 24 hours.  ACTIVITY:  You should plan to take it easy  for the rest of today and you should NOT DRIVE or use heavy machinery until tomorrow (because of the sedation medicines used during the test).    FOLLOW UP: Our staff will call the number listed on your records 48-72 hours following your procedure to check on you and address any questions or concerns that you may have regarding the information given to you following your procedure. If we do not reach you, we will leave a message.  We will attempt to reach you two times.  During this call, we will ask if you have developed any symptoms of COVID 19. If you develop any symptoms (ie: fever, flu-like symptoms, shortness of breath, cough etc.) before then, please call [phone].  If you test positive for Covid 19 in the 2 weeks post procedure, please call and report this information to us.    If any biopsies were taken you will be contacted by phone or by letter within the next 1-3 weeks.  Please call us at [phone] if you have not heard about the biopsies in 3 weeks.    SIGNATURES/CONFIDENTIALITY: You and/or your care partner have signed paperwork which will be entered into your electronic medical record.  These signatures attest to the fact that that the information above on your After Visit Summary has been reviewed and is understood.  Full responsibility of the confidentiality of this discharge information lies with you and/or your care-partner.  

## 2022-04-06 NOTE — Progress Notes (Signed)
Called to room to assist during endoscopic procedure.  Patient ID and intended procedure confirmed with present staff. Received instructions for my participation in the procedure from the performing physician.  

## 2022-04-08 ENCOUNTER — Telehealth: Payer: Self-pay

## 2022-04-08 NOTE — Telephone Encounter (Signed)
Attempted to reach patient for post-procedure f/u call (2nd attempt). No answer. Left message for her to please not hesitate to call us if she has any questions/concerns regarding her care. ?

## 2022-04-08 NOTE — Telephone Encounter (Signed)
Attempted to reach patient for post-procedure f/u call. No answer. Left message. ?

## 2022-04-08 NOTE — Telephone Encounter (Signed)
Patient returned your call. Per patient, she is fine. Her only concern was the anesthesia. States "it was way too much." End of call.  ?

## 2022-04-12 ENCOUNTER — Encounter: Payer: Self-pay | Admitting: Gastroenterology

## 2022-04-23 ENCOUNTER — Telehealth (INDEPENDENT_AMBULATORY_CARE_PROVIDER_SITE_OTHER): Payer: Medicaid Other | Admitting: Podiatry

## 2022-04-23 ENCOUNTER — Encounter: Payer: Self-pay | Admitting: Podiatry

## 2022-04-23 ENCOUNTER — Ambulatory Visit (INDEPENDENT_AMBULATORY_CARE_PROVIDER_SITE_OTHER): Payer: Medicaid Other | Admitting: Podiatry

## 2022-04-23 DIAGNOSIS — Q828 Other specified congenital malformations of skin: Secondary | ICD-10-CM

## 2022-04-23 DIAGNOSIS — B353 Tinea pedis: Secondary | ICD-10-CM

## 2022-04-23 DIAGNOSIS — M79674 Pain in right toe(s): Secondary | ICD-10-CM | POA: Diagnosis not present

## 2022-04-23 DIAGNOSIS — B351 Tinea unguium: Secondary | ICD-10-CM

## 2022-04-23 DIAGNOSIS — M79675 Pain in left toe(s): Secondary | ICD-10-CM

## 2022-04-23 DIAGNOSIS — E1151 Type 2 diabetes mellitus with diabetic peripheral angiopathy without gangrene: Secondary | ICD-10-CM

## 2022-04-23 MED ORDER — KETOCONAZOLE 2 % EX CREA: 1.0000 "application " | TOPICAL_CREAM | Freq: Every day | CUTANEOUS | 0 refills | Status: AC

## 2022-04-23 MED ORDER — KETOCONAZOLE 2 % EX CREA: 1.0000 "application " | TOPICAL_CREAM | Freq: Every day | CUTANEOUS | 0 refills | Status: DC

## 2022-04-23 NOTE — Telephone Encounter (Signed)
Spoke to [employer] at [facility]. I canceled Rx for Ketoconazole Cream at [facility]. Patient wanted Rx sent to My [facility]. ?

## 2022-04-23 NOTE — Progress Notes (Signed)
ANNUAL DIABETIC FOOT EXAM ? ?Subjective: ?Personal assistant presents today for annual diabetic foot examination. ? ?Patient relates 14 year h/o diabetes. ? ?Patient denies any h/o foot wounds. ? ?Patient has h/o osteomyelitis of L 2nd toe, which healed via help of wound care. ? ?Patient admits symptoms of pins/needles sensation in feet. ? ?Patient denies any numbness, tingling, burning, or pins/needle sensation in feet. ? ?Patient's blood sugar was 141 mg/dl today. Last known  HgA1c was unknown.  ? ?Risk factors: diabetes, history of foot/leg ulcer, HTN, hyperlipidemia, hypercholesterolemia, diabetic renal disease, h/o tobacco use in remission, foot deformity. ? ?Benito Mccreedy, MD is patient's PCP. Last visit was Apr 19, 2022. ? ?Past Medical History:  ?Diagnosis Date  ? Arthritis   ? Cellulitis of left leg 07/07/2012  ? Charcot's joint of foot   ? Constipation   ? Diabetes mellitus   ? Head injury, closed, with brief LOC ([place])   ? High cholesterol   ? Hypertension   ? Knee pain, right   ? Obese   ? Obesity (BMI 30-39.9) 12/08/2017  ? Shortness of breath   ? with too much fluid- not currently  ? ?Patient Active Problem List  ? Diagnosis Date Noted  ? Hyperlipidemia 10/12/2021  ? Adjustment disorder 11/21/2020  ? Anemia 11/21/2020  ? Diabetic renal disease ([place]) 11/21/2020  ? Hyperglycemia due to type 2 diabetes mellitus ([place]) 11/21/2020  ? Low back pain 11/21/2020  ? Recurrent major depression ([place]) 11/21/2020  ? Obesity due to excess calories 02/28/2020  ? Obesity (BMI 30-39.9) 12/08/2017  ? Streptococcal bacteremia 12/06/2017  ? Uncontrolled type 2 diabetes mellitus with hyperglycemia ([place]) 12/06/2017  ? Hyponatremia 12/06/2017  ? Postmenopausal bleeding 08/27/2015  ? Adenomyosis 08/27/2015  ? Skin ulcer of left foot including toes ([place]) 05/02/2015  ? Equinus deformity of foot, acquired 04/02/2015  ? Tenosynovitis of left foot 04/02/2015  ? Arthritis of knee 08/13/2014  ? Onychomycosis 11/02/2013  ? Acquired  deformity of toe 11/02/2013  ? Unspecified venous (peripheral) insufficiency 09/13/2012  ? Cellulitis of left leg 07/07/2012  ? ANXIETY 11/19/2007  ? DIABETES MELLITUS, TYPE II 07/29/2007  ? DEPRESSION 07/29/2007  ? Essential hypertension 07/29/2007  ? ?Past Surgical History:  ?Procedure Laterality Date  ? carbuncle    ? back of head  ? CESAREAN SECTION    ? COLONOSCOPY    ? JOINT REPLACEMENT    ? KNEE SURGERY    ? quad  ? QUADRICEPS REPAIR    ? TOTAL KNEE ARTHROPLASTY Left 08/13/2014  ? Procedure: TOTAL KNEE ARTHROPLASTY;  Hodgman: Meredith Pel, MD;  Location: [facility];  Service: Orthopedics;  Laterality: Left;  ? ?Current Outpatient Medications on File Prior to Visit  ?Medication Sig Dispense Refill  ? ACCU-CHEK AVIVA PLUS test strip 3 (three) times daily. for testing  5  ? ACCU-CHEK FASTCLIX LANCETS MISC TEST QID  2  ? acetaminophen (TYLENOL) 325 MG tablet Take 650 mg by mouth every 6 (six) hours as needed.    ? Acetaminophen-Codeine 300-30 MG tablet Take by mouth. (Patient not taking: Reported on 04/06/2022)    ? albuterol (VENTOLIN HFA) 108 (90 Base) MCG/ACT inhaler INHALE 2 PUFFS BY MOUTH EVERY 6 HOURS (Patient not taking: Reported on 03/22/2022)    ? atenolol (TENORMIN) 25 MG tablet Take by mouth. (Patient not taking: Reported on 04/06/2022)    ? atorvastatin (LIPITOR) 40 MG tablet Take 40 mg by mouth daily. (Patient not taking: Reported on 03/22/2022)    ? diclofenac Sodium (  VOLTAREN) 1 % GEL Apply 2 g topically 4 (four) times daily.    ? Dulaglutide (TRULICITY) 3 NI/7.7OE SOPN 3 mg subcutaneously once a week    ? glipiZIDE (GLUCOTROL XL) 5 MG 24 hr tablet Take 5 mg by mouth daily.    ? lisinopril-hydrochlorothiazide (ZESTORETIC) 20-25 MG tablet Take 2 tablets by mouth daily.    ? metFORMIN (GLUCOPHAGE) 500 MG tablet TK 1 T PO BID    ? ?No current facility-administered medications on file prior to visit.  ?  ?Allergies  ?Allergen Reactions  ? Iodine Anaphylaxis and Hives  ?  Certain iodine in fish  ? Red Dye  Hives  ? Shellfish Allergy Anaphylaxis  ?  Iodine breaks her out and "stops her breathing" ?Iodine breaks her out and "stops her breathing"  ? Aleve [Naproxen Sodium] Hives  ? Iohexol Itching  ?  Can possibly tolerate with Benadryl (but ask first??) ?  ? Percocet [Oxycodone-Acetaminophen] Hives, Itching and Other (See Comments)  ?  Causes paranoia  ? ?Social History  ? ?Occupational History  ? Not on file  ?Tobacco Use  ? Smoking status: Former  ?  Years: 0.00  ?  Types: Cigarettes  ?  Quit date: 12/13/1978  ?  Years since quitting: 43.3  ? Smokeless tobacco: Never  ?Vaping Use  ? Vaping Use: Never used  ?Substance and Sexual Activity  ? Alcohol use: No  ?  Alcohol/week: 0.0 standard drinks  ? Drug use: No  ? Sexual activity: Not on file  ? ?Family History  ?Problem Relation Age of Onset  ? Diabetes Mother   ? Hyperlipidemia Mother   ? Hypertension Mother   ? Diabetes Father   ? Hyperlipidemia Father   ? Hypertension Father   ? Cancer Brother   ? Heart disease Brother   ? Hypertension Brother   ? Heart attack Brother   ? Colon cancer Neg Hx   ? Esophageal cancer Neg Hx   ? Rectal cancer Neg Hx   ? Stomach cancer Neg Hx   ? ?Immunization History  ?Administered Date(s) Administered  ? Pneumococcal Polysaccharide-23 08/15/2014  ? Tdap 07/07/2012  ?  ? ?Review of Systems: Negative except as noted in the HPI.  ? ?Objective: ?There were no vitals filed for this visit. ? ?Sherlyn Blann is a pleasant 61 y.o. female in NAD. AAO X 3. ? ?Vascular Examination: ?CFT <3 seconds b/l LE. Faintly palpable DP pulses b/l LE. Nonpalpable PT pulse(s) b/l LE. No pain with calf compression RLE. Lower extremity skin temperature gradient within normal limits. Trace edema noted BLE. Evidence of chronic venous insufficiency b/l LE. No ischemia or gangrene noted b/l LE. No cyanosis or clubbing noted b/l LE. ? ?Dermatological Examination: ?Pedal skin is warm and supple b/l LE. No open wounds b/l LE. No interdigital macerations noted b/l LE.  Toenails 1-5 b/l elongated, discolored, dystrophic, thickened, crumbly with subungual debris and tenderness to dorsal palpation. Porokeratotic lesion(s) L 2nd toe. No erythema, no edema, no drainage, no fluctuance. Diffuse scaling noted peripherally and plantarly b/l feet.  No interdigital macerations.  No blisters, no weeping. No signs of secondary bacterial infection noted. ? ?Neurological Examination: ?Pt has subjective symptoms of neuropathy. Protective sensation intact 5/5 intact bilaterally with 10g monofilament b/l. Vibratory sensation intact b/l. ? ?Musculoskeletal Examination: ?Muscle strength 5/5 to all lower extremity muscle groups bilaterally. Clawtoe deformity L 2nd toe. Pes planus deformity noted bilateral LE. ? ?Footwear Assessment: ?Does the patient wear appropriate shoes? Yes. ?Does the  patient need inserts/orthotics? Yes. ? ?Assessment: ?1. Porokeratosis   ?2. Onychomycosis   ?3. Tinea pedis of both feet   ?4. Type II diabetes mellitus with peripheral circulatory disorder (HCC)   ?  ? ?ADA Risk Categorization: ?High Risk  ?Patient has one or more of the following: ?Loss of protective sensation ?Absent pedal pulses ?Severe Foot deformity ?History of foot ulcer ? ?Plan: ?-Patient was evaluated and treated. All patient's and/or POA's questions/concerns answered on today's visit. ?-Ordered noninvasive arterial studies ABIs with and without TBIs for b/l lower extremities. ?-Medicaid ABN signed for this year. Patient consents for services of paring of porokeratosis left 2nd toe today. Copy has been placed in patient chart. ?-Mycotic toenails 1-5 bilaterally were debrided in length and girth with sterile nail nippers and dremel without incident. ?-Porokeratotic lesion(s) L 2nd toe pared and enucleated with sterile scalpel blade without incident. Total number of lesions debrided=1. ?-For tinea pedis, Rx sent to pharmacy for Ketoconazole Cream 2% to be applied once daily for six weeks. ?-Patient/POA to call  should there be question/concern in the interim. ?Return in about 9 weeks (around 06/25/2022). ? ?[name] Board, DPM ?

## 2022-05-04 ENCOUNTER — Ambulatory Visit (HOSPITAL_COMMUNITY)
Admission: RE | Admit: 2022-05-04 | Discharge: 2022-05-04 | Disposition: A | Discharge status: Home / Self Care | Payer: Medicaid Other | Source: Ambulatory Visit | Attending: Podiatry | Admitting: Podiatry

## 2022-05-04 DIAGNOSIS — E1151 Type 2 diabetes mellitus with diabetic peripheral angiopathy without gangrene: Secondary | ICD-10-CM | POA: Diagnosis present

## 2022-05-11 ENCOUNTER — Other Ambulatory Visit (HOSPITAL_COMMUNITY)
Admission: RE | Admit: 2022-05-11 | Discharge: 2022-05-11 | Disposition: A | Discharge status: Home / Self Care | Payer: Medicaid Other | Source: Ambulatory Visit | Attending: Obstetrics | Admitting: Obstetrics

## 2022-05-11 ENCOUNTER — Ambulatory Visit (INDEPENDENT_AMBULATORY_CARE_PROVIDER_SITE_OTHER): Payer: Medicaid Other | Admitting: Student

## 2022-05-11 ENCOUNTER — Encounter: Payer: Self-pay | Admitting: Student

## 2022-05-11 VITALS — BP 133/69 | HR 77 | Ht 65.0 in | Wt 270.0 lb

## 2022-05-11 DIAGNOSIS — Z01419 Encounter for gynecological examination (general) (routine) without abnormal findings: Secondary | ICD-10-CM | POA: Diagnosis present

## 2022-05-11 NOTE — Progress Notes (Signed)
New GYN presents for AEX/PAP.  Last Mammogram was last year.  Reports no complaints today.

## 2022-05-11 NOTE — Progress Notes (Signed)
ANNUAL EXAM Patient name: Kaitlyn Castillo MRN [id number]  Date of birth: Aug 21, 1961 Chief Complaint:   NEW GYN/ANNUAL  History of Present Illness:   Kaitlyn Castillo is a 61 y.o. [id number] African-American female being seen today for a routine annual exam.  Current complaints: None  Patient's last menstrual period was 08/22/2015.   The pregnancy intention screening data noted above was reviewed. Potential methods of contraception were discussed. The patient elected to proceed with No data recorded.   Last pap Unknown. Results were: negative per pt report at   . H/O abnormal pap: no Last mammogram: 06/2021. Results were: normal. Family h/o breast cancer: no Last colonoscopy: 03/2022. Results were: abnormal "The polyp removed during your recent procedure was found to be adenomatous.  These are considered to be pre-cancerous polyps that may have grown into cancers if they had not been removed.  Based on current nationally recognized surveillance guidelines, I recommend that you have a repeat colonoscopy in 5 years. ." Family h/o colorectal cancer: no     05/11/2022   11:12 AM  Depression screen PHQ 2/9  Decreased Interest 0  Down, Depressed, Hopeless 0  PHQ - 2 Score 0  Altered sleeping 1  Tired, decreased energy 0  Change in appetite 1  Feeling bad or failure about yourself  0  Trouble concentrating 0  Moving slowly or fidgety/restless 0  Suicidal thoughts 0  PHQ-9 Score 2  Difficult doing work/chores Not difficult at all         View : No data to display.           Review of Systems:   Pertinent items are noted in HPI Denies any headaches, blurred vision, fatigue, shortness of breath, chest pain, abdominal pain, abnormal vaginal discharge/itching/odor/irritation, problems with periods, bowel movements, urination, or intercourse unless otherwise stated above. Pertinent History Reviewed:  Reviewed past medical,surgical, social and family history.  Reviewed problem list,  medications and allergies. Physical Assessment:   Vitals:   05/11/22 1108  BP: 133/69  Pulse: 77  Weight: 270 lb (122.5 kg)  Height: '5\' 5"'$  (1.651 m)  Body mass index is 44.93 kg/m.        Physical Examination:   General appearance - well appearing, and in no distress  Mental status - alert, oriented to person, place, and time  Psych:  She has a normal mood and affect  Skin - warm and dry, normal color, no suspicious lesions noted  Chest - effort normal, all lung fields clear to auscultation bilaterally  Heart - normal rate and regular rhythm  Neck:  midline trachea, no thyromegaly or nodules  Breasts - breasts appear normal, no suspicious masses, no skin or nipple changes  Abdomen - soft, nontender, nondistended, no masses or organomegaly  Pelvic - VULVA: normal appearing vulva with no masses, tenderness or lesions  VAGINA: normal appearing vagina with normal color and discharge, no lesions, mild atrophy CERVIX: normal appearing cervix without discharge or lesions, no CMT  Thin prep pap is done  HR HPV cotesting  UTERUS: uterus is felt to be normal size, shape, consistency and nontender   ADNEXA: No adnexal masses or tenderness noted.  Rectal - normal rectal, good sphincter tone  Extremities:  No swelling or varicosities noted  Chaperone present for exam  No results found for this or any previous visit (from the past 24 hour(s)).  Assessment & Plan:  1) Well-Woman Exam -Pap collected today   Labs/procedures today:  Orders Placed This Encounter  Procedures   MM 3D SCREEN BREAST BILATERAL    Mammogram: schedule screening mammo as soon as possible, or sooner if problems. Due July 2023 Colonoscopy: per GI, or sooner if problems. GI recommended F/U 2028  No orders of the defined types were placed in this encounter.   Meds: No orders of the defined types were placed in this encounter.   Follow-up: No follow-ups on file.  Johnston Ebbs, NP 05/11/2022 11:50 AM

## 2022-05-13 LAB — CYTOLOGY - PAP
Comment: NEGATIVE
Diagnosis: NEGATIVE
High risk HPV: NEGATIVE

## 2022-05-14 ENCOUNTER — Telehealth: Payer: Self-pay

## 2022-05-14 NOTE — Telephone Encounter (Signed)
Left a detailed message on patient's cell phone voicemail with Dr. Heber Penryn comments. Also left a callback number for patient if she has any questions.   Dr. Elisha Ponder comments:  Please inform patient her circulation testing is normal. I will see her at her next scheduled appointment.

## 2022-05-14 NOTE — Telephone Encounter (Signed)
-----   Message from [name] Board, DPM sent at 05/07/2022 10:18 AM EDT ----- Please inform patient her circulation testing is normal. I will see her at her next scheduled appointment. Thanks!

## 2022-06-25 ENCOUNTER — Ambulatory Visit
Admission: RE | Admit: 2022-06-25 | Discharge: 2022-06-25 | Disposition: A | Discharge status: Home / Self Care | Payer: Medicaid Other | Source: Ambulatory Visit | Attending: Student | Admitting: Student

## 2022-06-25 DIAGNOSIS — Z01419 Encounter for gynecological examination (general) (routine) without abnormal findings: Secondary | ICD-10-CM

## 2022-07-30 ENCOUNTER — Ambulatory Visit (INDEPENDENT_AMBULATORY_CARE_PROVIDER_SITE_OTHER): Payer: Medicaid Other | Admitting: Podiatry

## 2022-07-30 ENCOUNTER — Encounter: Payer: Self-pay | Admitting: Podiatry

## 2022-07-30 DIAGNOSIS — B351 Tinea unguium: Secondary | ICD-10-CM | POA: Diagnosis not present

## 2022-07-30 DIAGNOSIS — M79674 Pain in right toe(s): Secondary | ICD-10-CM

## 2022-07-30 DIAGNOSIS — L84 Corns and callosities: Secondary | ICD-10-CM

## 2022-07-30 DIAGNOSIS — M79675 Pain in left toe(s): Secondary | ICD-10-CM

## 2022-07-30 DIAGNOSIS — M205X2 Other deformities of toe(s) (acquired), left foot: Secondary | ICD-10-CM | POA: Diagnosis not present

## 2022-07-30 DIAGNOSIS — E1151 Type 2 diabetes mellitus with diabetic peripheral angiopathy without gangrene: Secondary | ICD-10-CM

## 2022-08-08 NOTE — Progress Notes (Signed)
  Subjective:  Patient ID: Personal assistant, female    DOB: January 19, 1961,  MRN: [id number]  Kaitlyn Castillo presents to clinic today for at risk foot care with history of diabetic neuropathy and preulcerative lesion(s) L 2nd toe and painful mycotic toenails that limit ambulation. Painful toenails interfere with ambulation. Aggravating factors include wearing enclosed shoe gear. Pain is relieved with periodic professional debridement. Painful porokeratotic lesions are aggravated when weightbearing with and without shoegear. Pain is relieved with periodic professional debridement.  Patient states blood glucose was 154 mg/dl today. Last known  HgA1c was around 7%.    New problem(s): None.   PCP is Osei-Bonsu, Iona Beard, MD , and last visit was  Apr 26, 2022  Allergies  Allergen Reactions   Iodine Anaphylaxis and Hives    Certain iodine in fish   Red Dye Hives   Shellfish Allergy Anaphylaxis    Iodine breaks her out and "stops her breathing" Iodine breaks her out and "stops her breathing"   Aleve [Naproxen Sodium] Hives   Iohexol Itching    Can possibly tolerate with Benadryl (but ask first??)    Percocet [Oxycodone-Acetaminophen] Hives, Itching and Other (See Comments)    Causes paranoia    Review of Systems: Negative except as noted in the HPI.  Objective: No changes noted in today's physical examination. Kaitlyn Castillo is a pleasant 61 y.o. y.o. female in NAD. AAO x 3.  Vascular Examination: CFT <3 seconds b/l LE. Faintly palpable DP pulses b/l LE. Nonpalpable PT pulse(s) b/l LE. No pain with calf compression RLE. Lower extremity skin temperature gradient within normal limits. Trace edema noted BLE. Evidence of chronic venous insufficiency b/l LE. No ischemia or gangrene noted b/l LE. No cyanosis or clubbing noted b/l LE.  Dermatological Examination: Pedal skin is warm and supple b/l LE. No open wounds b/l LE. No interdigital macerations noted b/l LE. Toenails 1-5 b/l elongated,  discolored, dystrophic, thickened, crumbly with subungual debris and tenderness to dorsal palpation.   Preulcerative lesion(s) distal tip of L 2nd toe. No erythema, no edema, no drainage, no fluctuance, but there is evidence of dried blood blister which was spreading plantarly. No underlying wound evident.  Neurological Examination: Pt has subjective symptoms of neuropathy. Protective sensation intact 5/5 intact bilaterally with 10g monofilament b/l. Vibratory sensation intact b/l.  Musculoskeletal Examination: Muscle strength 5/5 to all lower extremity muscle groups bilaterally. Clawtoe deformity L 2nd toe. Pes planus deformity noted bilateral LE.  Assessment/Plan: 1. Onychomycosis   2. Pre-ulcerative corn or callous   3. Clawtoe, acquired, left   4. Type II diabetes mellitus with peripheral circulatory disorder (HCC)   -Examined patient. -Discussed worsening of appearance of lesion distal tip left 2nd toe. This is a digit where she has h/o osteomyelitis. Constructed a padded coban wrap for digit for daily protection. Will have her see Dr. Sherryle Lis for evaluation. -Toenails 1-5 b/l were debrided in length and girth with sterile nail nippers and dremel without iatrogenic bleeding.  -Preulcerative lesion pared distal tip of left 2nd toe. Total number pared=1. -Patient/POA to call should there be question/concern in the interim.   Return in about 9 weeks (around 10/01/2022).  [name] Board, DPM

## 2022-08-09 ENCOUNTER — Ambulatory Visit (INDEPENDENT_AMBULATORY_CARE_PROVIDER_SITE_OTHER): Payer: Medicaid Other | Admitting: Podiatry

## 2022-08-09 ENCOUNTER — Ambulatory Visit (INDEPENDENT_AMBULATORY_CARE_PROVIDER_SITE_OTHER): Payer: Medicaid Other

## 2022-08-09 DIAGNOSIS — L89892 Pressure ulcer of other site, stage 2: Secondary | ICD-10-CM | POA: Diagnosis not present

## 2022-08-09 DIAGNOSIS — M2042 Other hammer toe(s) (acquired), left foot: Secondary | ICD-10-CM | POA: Diagnosis not present

## 2022-08-09 NOTE — Progress Notes (Signed)
  Subjective:  Patient ID: Kaitlyn Castillo, female    DOB: 05-20-1961,  MRN: [id number]  Chief Complaint  Patient presents with   Foot Problem    surgical consultation for contracted left 2nd toe. History of partial thickness ulceration at distal tip. Needs xrays    61 y.o. female presents with the above complaint. History confirmed with patient.  She returns for follow-up of her contracted second toe.  Previously we have done flexor tenotomy on the third toe on this foot.  Has had persistent ulceration that waxes and wanes at the site.  Objective:  Physical Exam: warm, good capillary refill, normal DP and PT pulses, and partial-thickness ulceration with breakdown of skin at tip of second toe with contracture and hammertoe deformity's, semireducible.  Third toe remains well aligned and ulcer free   Radiographs: Multiple views x-ray of the left foot: Hammertoe contracture noted dystrophy of the distal phalanx Assessment:   1. Pressure injury of toe of left foot, stage 2 (Datto)   2. Hammertoe of left foot      Plan:  Patient was evaluated and treated and all questions answered.  Reviewed her radiographs together.  Discussed further treatment for this including offloading padding and flexor tenotomy.  This has been successful for her third toe and I recommended this as well to offload the tip of the second toe that is semireducible.  I discussed with her that this toe is stiffer due to the severity of the deformity and may not offer as much of a release of the third toe but likely is still worth proceeding with.  We discussed the risk and benefits of proceeding with the procedure.  Following verbal consent, and local field block digital block was performed of the second toe with 1.5 cc each of 2% lidocaine plain and 0.5% Marcaine plain.  The toe was prepped and draped in the sterile manner with Betadine.  An 18-gauge needle sterilely was used to transect the long flexor tendon that provided  some release of the flexion contracture.  Compression dressing with Silvadene, Telfa and dry sterile gauze was applied with Coban with the toe in a dorsiflexed position.  She tolerated this well.  Post care instructions were given and I will see her back in 4 weeks for postop visit.  Return in about 4 weeks (around 09/06/2022) for tenotomy follow up (POV).

## 2022-09-07 ENCOUNTER — Ambulatory Visit (INDEPENDENT_AMBULATORY_CARE_PROVIDER_SITE_OTHER): Payer: Medicaid Other | Admitting: Podiatry

## 2022-09-07 DIAGNOSIS — M2042 Other hammer toe(s) (acquired), left foot: Secondary | ICD-10-CM

## 2022-09-07 NOTE — Patient Instructions (Signed)

## 2022-09-09 NOTE — Progress Notes (Signed)
  Subjective:  Patient ID: Personal assistant, female    DOB: February 28, 1961,  MRN: [id number]  Chief Complaint  Patient presents with   Hammer Toe    Follow up tenotomy    61 y.o. female returns for postprocedural follow-up for second toe.  Objective:  Physical Exam: warm, good capillary refill, normal DP and PT pulses, second toe remains in good position with no skin breakdown noted.  Third toe remains well aligned and ulcer free   Radiographs: Multiple views x-ray of the left foot: Hammertoe contracture noted dystrophy of the distal phalanx Assessment:   1. Hammertoe of left foot      Plan:  Patient was evaluated and treated and all questions answered.  Overall doing very well.  Can leave open air.  Wounds have healed completely.  Contracture appears to be improved in appearance and position.  Return to see me as needed if these worsens or have other issues with other toes.  She does have contracture of the fourth and fifth toe on this foot but currently there is no impending callus or ulceration or skin breakdown  Return if symptoms worsen or fail to improve.

## 2022-09-24 ENCOUNTER — Encounter: Payer: Self-pay | Admitting: Podiatry

## 2022-10-04 ENCOUNTER — Ambulatory Visit: Payer: Medicaid Other | Admitting: Podiatry

## 2022-10-11 ENCOUNTER — Encounter: Payer: Self-pay | Admitting: Podiatry

## 2022-10-11 ENCOUNTER — Ambulatory Visit (INDEPENDENT_AMBULATORY_CARE_PROVIDER_SITE_OTHER): Payer: Medicaid Other | Admitting: Podiatry

## 2022-10-11 DIAGNOSIS — E1151 Type 2 diabetes mellitus with diabetic peripheral angiopathy without gangrene: Secondary | ICD-10-CM | POA: Diagnosis not present

## 2022-10-11 DIAGNOSIS — B351 Tinea unguium: Secondary | ICD-10-CM | POA: Diagnosis not present

## 2022-10-11 DIAGNOSIS — Z872 Personal history of diseases of the skin and subcutaneous tissue: Secondary | ICD-10-CM

## 2022-10-11 NOTE — Progress Notes (Unsigned)
  Subjective:  Patient ID: Personal assistant, female    DOB: [birth year],  MRN: [id number]  Kaitlyn Castillo presents to clinic today for at risk diabetic foot care with h/o diabetic ulcer with osteomyelitis of left 2nd toe. Chief Complaint  Patient presents with   Nail Problem    Dfc BG -  200 this morning  A1C - 7.2  PCP - Dr Smith Mince , last OV 09/29/22   Patient has started working out at [employer] again.  New problem(s): None.   PCP is Trey Sailors, [place].  Allergies  Allergen Reactions   Iodine Anaphylaxis and Hives    Certain iodine in fish   Red Dye Hives   Shellfish Allergy Anaphylaxis    Iodine breaks her out and "stops her breathing" Iodine breaks her out and "stops her breathing"   Aleve [Naproxen Sodium] Hives   Iohexol Itching    Can possibly tolerate with Benadryl (but ask first??)    Percocet [Oxycodone-Acetaminophen] Hives, Itching and Other (See Comments)    Causes paranoia    Review of Systems: Negative except as noted in the HPI.  Objective: No changes noted in today's physical examination.  Kaitlyn Castillo is a pleasant 61 y.o. female in NAD. AAO x 3.  Vascular Examination: CFT <3 seconds b/l LE. Faintly palpable DP pulses b/l LE. Nonpalpable PT pulse(s) b/l LE. No pain with calf compression RLE. Lower extremity skin temperature gradient within normal limits. Trace edema noted BLE. Evidence of chronic venous insufficiency b/l LE. No ischemia or gangrene noted b/l LE. No cyanosis or clubbing noted b/l LE.  Dermatological Examination: Pedal skin is warm and supple b/l LE. No open wounds b/l LE. No interdigital macerations noted b/l LE. Toenails 1-5 b/l elongated, discolored, dystrophic, thickened, crumbly with subungual debris and tenderness to dorsal palpation.   Minimal hyperkeratotic lesion(s) distal tip of L 2nd toe. No erythema, no edema, no drainage, no fluctuance  Neurological Examination: Pt has subjective symptoms of neuropathy. Protective  sensation intact 5/5 intact bilaterally with 10g monofilament b/l. Vibratory sensation intact b/l.  Musculoskeletal Examination: Muscle strength 5/5 to all lower extremity muscle groups bilaterally. Clawtoe deformity L 2nd toe. Pes planus deformity noted bilateral LE.  Assessment/Plan: 1. Onychomycosis   2. Healed ulcer of left foot   3. Type II diabetes mellitus with peripheral circulatory disorder (HCC)     No orders of the defined types were placed in this encounter.   -Patient was evaluated and treated. All patient's and/or POA's questions/concerns answered on today's visit. -Patient evaluated today. Left 2nd digit remains closed/healed. She has been cleared by Dr. Sherryle Lis. Minimal hyperkeratosis pared as a courtesy. Continue daily protection/padding of digit daily to prevent recurrence of ulcer. -Patient to continue soft, supportive shoe gear daily. -Mycotic toenails 1-5 bilaterally were debrided in length and girth with sterile nail nippers and dremel without incident. -Patient/POA to call should there be question/concern in the interim.   Return in about 2 months (around 12/17/2022).  [name] Board, DPM

## 2022-12-17 ENCOUNTER — Ambulatory Visit (INDEPENDENT_AMBULATORY_CARE_PROVIDER_SITE_OTHER): Payer: Medicaid Other | Admitting: Podiatry

## 2022-12-17 VITALS — BP 151/77

## 2022-12-17 DIAGNOSIS — B351 Tinea unguium: Secondary | ICD-10-CM | POA: Diagnosis not present

## 2022-12-17 DIAGNOSIS — E1151 Type 2 diabetes mellitus with diabetic peripheral angiopathy without gangrene: Secondary | ICD-10-CM

## 2022-12-17 DIAGNOSIS — B353 Tinea pedis: Secondary | ICD-10-CM

## 2022-12-17 MED ORDER — KETOCONAZOLE 2 % EX CREA: TOPICAL_CREAM | CUTANEOUS | 1 refills | Status: AC

## 2022-12-17 NOTE — Progress Notes (Signed)
  Subjective:  Patient ID: [name], female    DOB: January 16, 1961,  MRN: [id number]  Kaitlyn Castillo presents to clinic today for at risk foot care. Pt has h/o NIDDM with PAD and painful elongated mycotic toenails 1-5 bilaterally which are tender when wearing enclosed shoe gear. Pain is relieved with periodic professional debridement.  Chief Complaint  Patient presents with   Nail Problem    [facility] BS-112 A1C-7.2 PCP- Norva Riffle PCP VST-10/2022   New problem(s): None.   PCP is Norm Salt, [place].  Allergies  Allergen Reactions   Iodine Anaphylaxis and Hives    Certain iodine in fish   Red Dye Hives   Shellfish Allergy Anaphylaxis    Iodine breaks her out and "stops her breathing" Iodine breaks her out and "stops her breathing"   Aleve [Naproxen Sodium] Hives   Iohexol Itching    Can possibly tolerate with Benadryl (but ask first??)    Percocet [Oxycodone-Acetaminophen] Hives, Itching and Other (See Comments)    Causes paranoia    Review of Systems: Negative except as noted in the HPI.  Objective: No changes noted in today's physical examination. Vitals:   12/17/22 0731  BP: (!) 151/77   Kaitlyn Castillo is a pleasant 62 y.o. female morbidly obese in NAD. AAO x 3.  Vascular Examination: CFT <3 seconds b/l LE. Faintly palpable DP pulses b/l LE. Nonpalpable PT pulse(s) b/l LE. No pain with calf compression RLE. Lower extremity skin temperature gradient within normal limits. Trace edema noted BLE. Evidence of chronic venous insufficiency b/l LE. No ischemia or gangrene noted b/l LE. No cyanosis or clubbing noted b/l LE.  Dermatological Examination: Pedal skin is warm and supple b/l LE. No open wounds b/l LE. No interdigital macerations noted b/l LE. Toenails 1-5 b/l elongated, discolored, dystrophic, thickened, crumbly with subungual debris and tenderness to dorsal palpation.   Resolved hyperkeratotic lesion(s) distal tip of L 2nd toe. No erythema, no edema,  no drainage, no fluctuance.  Diffuse scaling noted peripherally and plantarly b/l feet.  No interdigital macerations.  No blisters, no weeping. No signs of secondary bacterial infection noted.  Neurological Examination: Pt has subjective symptoms of neuropathy. Protective sensation intact 5/5 intact bilaterally with 10g monofilament b/l. Vibratory sensation intact b/l.  Musculoskeletal Examination: Muscle strength 5/5 to all lower extremity muscle groups bilaterally. Clawtoe deformity L 2nd toe. Pes planus deformity noted bilateral LE.  Assessment/Plan: 1. Onychomycosis   2. Type II diabetes mellitus with peripheral circulatory disorder (HCC)   3. Tinea pedis of both feet     Meds ordered this encounter  Medications   ketoconazole (NIZORAL) 2 % cream    Sig: Apply to both feet and between toes once daily for 6 weeks.    Dispense:  60 g    Refill:  1    -Examined patient. -Continue foot and shoe inspections daily. Monitor blood glucose per PCP/Endocrinologist's recommendations. -Patient to continue soft, supportive shoe gear daily. -Toenails 1-5 b/l were debrided in length and girth with sterile nail nippers and dremel without iatrogenic bleeding.  -Patient/POA to call should there be question/concern in the interim.   Return in about 9 weeks (around 02/18/2023).  Freddie Breech, DPM

## 2022-12-19 ENCOUNTER — Ambulatory Visit (HOSPITAL_COMMUNITY)
Admission: EM | Admit: 2022-12-19 | Discharge: 2022-12-19 | Disposition: A | Discharge status: Home / Self Care | Payer: Medicaid Other | Attending: Emergency Medicine | Admitting: Emergency Medicine

## 2022-12-19 ENCOUNTER — Encounter (HOSPITAL_COMMUNITY): Payer: Self-pay | Admitting: *Deleted

## 2022-12-19 ENCOUNTER — Other Ambulatory Visit: Payer: Self-pay

## 2022-12-19 DIAGNOSIS — J069 Acute upper respiratory infection, unspecified: Secondary | ICD-10-CM

## 2022-12-19 DIAGNOSIS — Z20822 Contact with and (suspected) exposure to covid-19: Secondary | ICD-10-CM | POA: Diagnosis not present

## 2022-12-19 NOTE — Discharge Instructions (Signed)
We will contact you if your COVID comes back positive and prescribe molnupiravir.  In the meantime, saline nasal irrigation with a NeilMed sinus rinse and distilled water as often as you want in addition to the pepper, ginger.

## 2022-12-19 NOTE — ED Provider Notes (Signed)
HPI  SUBJECTIVE:  Kaitlyn Castillo is a 62 y.o. female who presents with 4 days of nasal congestion, rhinorrhea, chest congestion, headache, fatigue, and a cough starting today.  She was around multiple sick grandchildren, and her son, who has similar symptoms and is with her today, tested positive for COVID.  No fevers, body aches, postnasal drip, sore throat, wheezing, shortness of breath, nausea, vomiting, diarrhea, abdominal pain.  No known COVID or flu exposure.  She got 2 doses of the COVID-vaccine.  She did not get the flu vaccine.  No antibiotics in the past month.  No antipyretic in the past 6 hours.  She tried ginger, black pepper, salt water gargles, hot foods and liquids with improvement in her symptoms.  No aggravating factors.  She has a past medical history of diabetes, hypertension, PVD, chronic kidney disease, hypercholesterolemia.  PCP: [facility].    Past Medical History:  Diagnosis Date   Arthritis    Cellulitis of left leg 07/07/2012   Charcot's joint of foot    Constipation    Diabetes mellitus    Head injury, closed, with brief LOC ([place])    High cholesterol    Hypertension    Knee pain, right    Obese    Obesity (BMI 30-39.9) 12/08/2017   Shortness of breath    with too much fluid- not currently    Past Surgical History:  Procedure Laterality Date   carbuncle     back of head   CESAREAN SECTION     COLONOSCOPY     JOINT REPLACEMENT     KNEE SURGERY     quad   QUADRICEPS REPAIR     TOTAL KNEE ARTHROPLASTY Left 08/13/2014   Procedure: TOTAL KNEE ARTHROPLASTY;  Wieand: Meredith Pel, MD;  Location: [place];  Service: Orthopedics;  Laterality: Left;    Family History  Problem Relation Age of Onset   Diabetes Mother    Hyperlipidemia Mother    Hypertension Mother    Diabetes Father    Hyperlipidemia Father    Hypertension Father    Cancer Brother    Heart disease Brother    Hypertension Brother    Heart attack Brother    Colon cancer  Neg Hx    Esophageal cancer Neg Hx    Rectal cancer Neg Hx    Stomach cancer Neg Hx     Social History   Tobacco Use   Smoking status: Former    Years: 0.00    Types: Cigarettes    Quit date: 12/13/1978    Years since quitting: 44.0   Smokeless tobacco: Never  Vaping Use   Vaping Use: Never used  Substance Use Topics   Alcohol use: No    Alcohol/week: 0.0 standard drinks of alcohol   Drug use: No    No current facility-administered medications for this encounter.  Current Outpatient Medications:    Dulaglutide (TRULICITY) 3 WV/3.7TG SOPN, 3 mg subcutaneously once a week, Disp: , Rfl:    glipiZIDE (GLUCOTROL XL) 5 MG 24 hr tablet, Take 5 mg by mouth daily., Disp: , Rfl:    lisinopril-hydrochlorothiazide (ZESTORETIC) 20-25 MG tablet, Take 2 tablets by mouth daily., Disp: , Rfl:    metFORMIN (GLUCOPHAGE) 500 MG tablet, Take 1 tablet by mouth daily., Disp: , Rfl:    ACCU-CHEK AVIVA PLUS test strip, 3 (three) times daily. for testing, Disp: , Rfl: 5   ACCU-CHEK FASTCLIX LANCETS MISC, TEST QID, Disp: , Rfl: 2   acetaminophen (  TYLENOL) 325 MG tablet, Take 650 mg by mouth every 6 (six) hours as needed., Disp: , Rfl:    atenolol (TENORMIN) 25 MG tablet, Take by mouth. (Patient not taking: Reported on 04/06/2022), Disp: , Rfl:    atorvastatin (LIPITOR) 40 MG tablet, Take 1 tablet by mouth daily., Disp: , Rfl:    diclofenac Sodium (VOLTAREN) 1 % GEL, Apply 2 g topically 4 (four) times daily., Disp: , Rfl:    ketoconazole (NIZORAL) 2 % cream, Apply to both feet and between toes once daily for 6 weeks., Disp: 60 g, Rfl: 1   PATADAY 0.1 % ophthalmic solution, 1 drop 2 (two) times daily., Disp: , Rfl:    phentermine 37.5 MG capsule, Take by mouth., Disp: , Rfl:   Allergies  Allergen Reactions   Iodine Anaphylaxis and Hives    Certain iodine in fish   Red Dye Hives   Shellfish Allergy Anaphylaxis    Iodine breaks her out and "stops her breathing" Iodine breaks her out and "stops her  breathing"   Aleve [Naproxen Sodium] Hives   Iohexol Itching    Can possibly tolerate with Benadryl (but ask first??)    Percocet [Oxycodone-Acetaminophen] Hives, Itching and Other (See Comments)    Causes paranoia     ROS  As noted in HPI.   Physical Exam  BP (!) 144/80   Pulse 85   Temp 98.2 F (36.8 C) (Oral)   Resp 16   LMP 08/22/2015   SpO2 99%   Constitutional: Well developed, well nourished, no acute distress Eyes:  EOMI, conjunctiva normal bilaterally HENT: Normocephalic, atraumatic,mucus membranes moist.  Clear nasal congestion.  Erythematous, swollen turbinates.  No maxillary, frontal sinus tenderness.  Unable to adequately visualize oropharynx. Neck: No cervical lymphadenopathy Respiratory: Normal inspiratory effort, lungs clear bilaterally Cardiovascular: Normal rate, regular rhythm, no murmurs GI: nondistended skin: No rash, skin intact Musculoskeletal: no deformities Neurologic: Alert & oriented x 3, no focal neuro deficits Psychiatric: Speech and behavior appropriate   ED Course   Medications - No data to display  Orders Placed This Encounter  Procedures   SARS CORONAVIRUS 2 (TAT 6-24 HRS) Anterior Nasal Swab    Standing Status:   Standing    Number of Occurrences:   1    Results for orders placed or performed during the hospital encounter of 12/19/22 (from the past 24 hour(s))  SARS CORONAVIRUS 2 (TAT 6-24 HRS) Anterior Nasal Swab     Status: None   Collection Time: 12/19/22  6:15 PM   Specimen: Anterior Nasal Swab  Result Value Ref Range   SARS Coronavirus 2 NEGATIVE NEGATIVE   No results found.  ED Clinical Impression  1. Upper respiratory tract infection, unspecified type   2. Encounter for laboratory testing for COVID-19 virus      ED Assessment/Plan     Checking COVID.  Will treat with molnupiravir if positive due to multiple medical comorbidities..  Otherwise, patient states that she is doing well with her herbal/home  remedies.  Advised saline nasal irrigation.  Follow-up with PCP as needed  COVID-negative.  Plan as above.  Discussed labs,  MDM, treatment plan, and plan for follow-up with patient.  patient agrees with plan.   No orders of the defined types were placed in this encounter.     *This clinic note was created using Dragon dictation software. Therefore, there may be occasional mistakes despite careful proofreading.  ?    Melynda Ripple, MD 12/20/22 1442

## 2022-12-19 NOTE — ED Triage Notes (Signed)
C/O sinus drainage, HA, cough onset approx 2 days ago. Has been taking Emergen-C, black pepper, ginger.

## 2022-12-20 LAB — SARS CORONAVIRUS 2 (TAT 6-24 HRS): SARS Coronavirus 2: NEGATIVE

## 2022-12-21 ENCOUNTER — Encounter: Payer: Self-pay | Admitting: Podiatry

## 2023-02-02 ENCOUNTER — Ambulatory Visit (INDEPENDENT_AMBULATORY_CARE_PROVIDER_SITE_OTHER): Payer: Medicaid Other

## 2023-02-02 ENCOUNTER — Ambulatory Visit (INDEPENDENT_AMBULATORY_CARE_PROVIDER_SITE_OTHER): Payer: Medicaid Other | Admitting: Orthopedic Surgery

## 2023-02-02 ENCOUNTER — Encounter: Payer: Self-pay | Admitting: Orthopedic Surgery

## 2023-02-02 DIAGNOSIS — M25561 Pain in right knee: Secondary | ICD-10-CM

## 2023-02-02 NOTE — Progress Notes (Signed)
Office Visit Note   Patient: Kaitlyn Castillo           Date of Birth: 01/26/1961           MRN: [id number] Visit Date: 02/02/2023 Requested by: Trey Sailors, [place],  [place] [address] PCP: Trey Sailors, PA  Subjective: Chief Complaint  Patient presents with   Right Knee - Pain    HPI: Kaitlyn Castillo is a 62 y.o. female who presents to the office reporting right knee pain.  Last seen 03/29/2021.  Had relatively acute onset of medial sided right knee pain in December.  Has gotten worse over the past 6 weeks but improved over the past several days.  Has a history of quadriceps repair x 2 done over 5 years ago.  Still has an extensor lag from that.  Patient states "I do not do pills well".  She is using Voltaren gel and heat.  The pain comes and goes.  She does have diabetes.  She enjoys working out as well as doing pool exercises..                ROS: All systems reviewed are negative as they relate to the chief complaint within the history of present illness.  Patient denies fevers or chills.  Assessment & Plan: Visit Diagnoses:  1. Right knee pain, unspecified chronicity     Plan: Impression is right-sided knee pain which is primarily medial and may represent mild to moderate arthritis in the medial compartment.  No effusion today.  Left total knee replacement done 7 years ago is doing well.  Plan at this time is injection treatment when and if symptoms worsen.  She is going to continue to work on quad strengthening and body mass index optimization.  She will follow-up with [place] as needed.  Follow-Up Instructions: No follow-ups on file.   Orders:  Orders Placed This Encounter  Procedures   XR KNEE 3 VIEW RIGHT   No orders of the defined types were placed in this encounter.     Procedures: No procedures performed   Clinical Data: No additional findings.  Objective: Vital Signs: LMP 08/22/2015   Physical Exam:  Constitutional: Patient  appears well-developed HEENT:  Head: Normocephalic Eyes:EOM are normal Neck: Normal range of motion Cardiovascular: Normal rate Pulmonary/chest: Effort normal Neurologic: Patient is alert Skin: Skin is warm Psychiatric: Patient has normal mood and affect  Ortho Exam: Ortho exam demonstrates 50 degree extensor lag on the right.  Full range of motion on the left.  No effusion in the right knee.  Has medial greater than lateral joint line tenderness with stable collateral and cruciate ligaments.  She has normal gait.  Specialty Comments:  No specialty comments available.  Imaging: XR KNEE 3 VIEW RIGHT  Result Date: 02/02/2023 AP axillary merchant radiographs right knee reviewed.  Mild medial compartment joint space narrowing is present which is minimally changed compared to radiographs from 6 years ago.  Moderate patellofemoral arthritis is present.  No acute fracture.  Alignment intact.    PMFS History: Patient Active Problem List   Diagnosis Date Noted   Hyperlipidemia 10/12/2021   Adjustment disorder 11/21/2020   Anemia 11/21/2020   Diabetic renal disease ([place]) 11/21/2020   Hyperglycemia due to type 2 diabetes mellitus ([place]) 11/21/2020   Low back pain 11/21/2020   Recurrent major depression ([place]) 11/21/2020   Obesity due to excess calories 02/28/2020   Obesity (BMI 30-39.9) 12/08/2017   Streptococcal bacteremia  12/06/2017   Uncontrolled type 2 diabetes mellitus with hyperglycemia ([place]) 12/06/2017   Hyponatremia 12/06/2017   Postmenopausal bleeding 08/27/2015   Adenomyosis 08/27/2015   Skin ulcer of left foot including toes ([place]) 05/02/2015   Equinus deformity of foot, acquired 04/02/2015   Tenosynovitis of left foot 04/02/2015   Arthritis of knee 08/13/2014   Onychomycosis 11/02/2013   Acquired deformity of toe 11/02/2013   Unspecified venous (peripheral) insufficiency 09/13/2012   Cellulitis of left leg 07/07/2012   ANXIETY 11/19/2007   DIABETES MELLITUS, TYPE II  07/29/2007   DEPRESSION 07/29/2007   Essential hypertension 07/29/2007   Past Medical History:  Diagnosis Date   Arthritis    Cellulitis of left leg 07/07/2012   Charcot's joint of foot    Constipation    Diabetes mellitus    Head injury, closed, with brief LOC ([place])    High cholesterol    Hypertension    Knee pain, right    Obese    Obesity (BMI 30-39.9) 12/08/2017   Shortness of breath    with too much fluid- not currently    Family History  Problem Relation Age of Onset   Diabetes Mother    Hyperlipidemia Mother    Hypertension Mother    Diabetes Father    Hyperlipidemia Father    Hypertension Father    Cancer Brother    Heart disease Brother    Hypertension Brother    Heart attack Brother    Colon cancer Neg Hx    Esophageal cancer Neg Hx    Rectal cancer Neg Hx    Stomach cancer Neg Hx     Past Surgical History:  Procedure Laterality Date   carbuncle     back of head   CESAREAN SECTION     COLONOSCOPY     JOINT REPLACEMENT     KNEE SURGERY     quad   QUADRICEPS REPAIR     TOTAL KNEE ARTHROPLASTY Left 08/13/2014   Procedure: TOTAL KNEE ARTHROPLASTY;  Posey: Meredith Pel, MD;  Location: [facility];  Service: Orthopedics;  Laterality: Left;   Social History   Occupational History   Not on file  Tobacco Use   Smoking status: Former    Years: 0.00    Types: Cigarettes    Quit date: 12/13/1978    Years since quitting: 44.1   Smokeless tobacco: Never  Vaping Use   Vaping Use: Never used  Substance and Sexual Activity   Alcohol use: No    Alcohol/week: 0.0 standard drinks of alcohol   Drug use: No   Sexual activity: Not Currently

## 2023-02-16 ENCOUNTER — Encounter: Payer: Self-pay | Admitting: Podiatry

## 2023-02-18 ENCOUNTER — Ambulatory Visit: Payer: Medicaid Other | Admitting: Podiatry

## 2023-04-11 ENCOUNTER — Telehealth: Payer: Self-pay | Admitting: Orthopedic Surgery

## 2023-04-11 DIAGNOSIS — M25561 Pain in right knee: Secondary | ICD-10-CM

## 2023-04-11 NOTE — Telephone Encounter (Signed)
Pt called requesting a referral for MRI for right knee. Please call pt at [phone].

## 2023-04-18 NOTE — Telephone Encounter (Signed)
Yes  thx

## 2023-04-19 NOTE — Telephone Encounter (Signed)
MRI ordered. Called and advised pt. She stated understanding

## 2023-04-22 ENCOUNTER — Ambulatory Visit: Payer: Medicaid Other | Admitting: Podiatry

## 2023-04-26 ENCOUNTER — Ambulatory Visit (INDEPENDENT_AMBULATORY_CARE_PROVIDER_SITE_OTHER): Payer: Medicaid Other | Admitting: Podiatry

## 2023-04-26 DIAGNOSIS — M79675 Pain in left toe(s): Secondary | ICD-10-CM

## 2023-04-26 DIAGNOSIS — B351 Tinea unguium: Secondary | ICD-10-CM

## 2023-04-26 DIAGNOSIS — E1151 Type 2 diabetes mellitus with diabetic peripheral angiopathy without gangrene: Secondary | ICD-10-CM | POA: Diagnosis not present

## 2023-04-26 DIAGNOSIS — M79674 Pain in right toe(s): Secondary | ICD-10-CM | POA: Diagnosis not present

## 2023-04-29 ENCOUNTER — Encounter: Payer: Self-pay | Admitting: Podiatry

## 2023-04-29 NOTE — Progress Notes (Signed)
  Subjective:  Patient ID: [name], female    DOB: 1961/07/29,  MRN: [id number]  Kaitlyn Castillo presents to clinic today for at risk foot care. Pt has h/o NIDDM with PAD and painful thick toenails that are difficult to trim. Pain interferes with ambulation. Aggravating factors include wearing enclosed shoe gear. Pain is relieved with periodic professional debridement.  Chief Complaint  Patient presents with   Nail Problem    [facility] BS-179 A1C-7.2 PCP-[facility] PCP VST- 3 weeks ago   New problem(s): None.   PCP is Norm Salt, [place].  Allergies  Allergen Reactions   Iodine Anaphylaxis and Hives    Certain iodine in fish   Red Dye Hives   Shellfish Allergy Anaphylaxis    Iodine breaks her out and "stops her breathing" Iodine breaks her out and "stops her breathing"   Aleve [Naproxen Sodium] Hives   Iohexol Itching    Can possibly tolerate with Benadryl (but ask first??)    Percocet [Oxycodone-Acetaminophen] Hives, Itching and Other (See Comments)    Causes paranoia    Review of Systems: Negative except as noted in the HPI.  Objective: No changes noted in today's physical examination. There were no vitals filed for this visit. Kaitlyn Castillo is a pleasant 62 y.o. female morbidly obese in NAD. AAO x 3.  Vascular Examination: CFT <3 seconds b/l LE. Faintly palpable DP pulses b/l LE. Nonpalpable PT pulse(s) b/l LE. No pain with calf compression RLE. Lower extremity skin temperature gradient within normal limits. Trace edema noted BLE. Evidence of chronic venous insufficiency b/l LE. No ischemia or gangrene noted b/l LE. No cyanosis or clubbing noted b/l LE.  Dermatological Examination: Pedal skin is warm and supple b/l LE. No open wounds b/l LE. No interdigital macerations noted b/l LE. Toenails 1-5 b/l elongated, discolored, dystrophic, thickened, crumbly with subungual debris and tenderness to dorsal palpation.   Resolved hyperkeratotic lesion(s) distal tip of L  2nd toe. No erythema, no edema, no drainage, no fluctuance.  Neurological Examination: Pt has subjective symptoms of neuropathy. Protective sensation intact 5/5 intact bilaterally with 10g monofilament b/l. Vibratory sensation intact b/l.  Musculoskeletal Examination: Muscle strength 5/5 to all lower extremity muscle groups bilaterally. Clawtoe deformity L 2nd toe. Pes planus deformity noted bilateral LE.  Assessment/Plan: 1. Pain due to onychomycosis of toenails of both feet   2. Type II diabetes mellitus with peripheral circulatory disorder [facility])     -Patient was evaluated and treated. All patient's and/or POA's questions/concerns answered on today's visit. -Continue supportive shoe gear daily. -Mycotic toenails 1-5 bilaterally were debrided in length and girth with sterile nail nippers and dremel without incident. -Patient/POA to call should there be question/concern in the interim.   Return in about 3 months (around 07/27/2023).  Freddie Breech, DPM

## 2023-05-11 ENCOUNTER — Other Ambulatory Visit: Payer: Self-pay | Admitting: Physician Assistant

## 2023-05-11 DIAGNOSIS — Z1231 Encounter for screening mammogram for malignant neoplasm of breast: Secondary | ICD-10-CM

## 2023-05-13 ENCOUNTER — Ambulatory Visit
Admission: RE | Admit: 2023-05-13 | Discharge: 2023-05-13 | Disposition: A | Discharge status: Home / Self Care | Payer: Medicaid Other | Source: Ambulatory Visit | Attending: Orthopedic Surgery | Admitting: Orthopedic Surgery

## 2023-05-13 DIAGNOSIS — M25561 Pain in right knee: Secondary | ICD-10-CM

## 2023-05-26 ENCOUNTER — Telehealth: Payer: Self-pay

## 2023-05-26 NOTE — Telephone Encounter (Signed)
LVM for pt. To cb to schedule MRI f/u

## 2023-05-26 NOTE — Telephone Encounter (Signed)
-----   Message from Barbette Or, RT sent at 05/26/2023  3:26 PM EDT -----  ----- Message ----- From: Cammy Copa, MD Sent: 05/26/2023   3:14 PM EDT To: Prescott Parma, RT  Does she have follow-up with me or Franky Macho?

## 2023-05-26 NOTE — Progress Notes (Signed)
Does she have follow-up with me or Kaitlyn Castillo?

## 2023-06-20 DIAGNOSIS — H6121 Impacted cerumen, right ear: Secondary | ICD-10-CM | POA: Insufficient documentation

## 2023-06-24 DIAGNOSIS — H903 Sensorineural hearing loss, bilateral: Secondary | ICD-10-CM | POA: Insufficient documentation

## 2023-06-27 ENCOUNTER — Ambulatory Visit: Payer: Medicaid Other

## 2023-07-06 ENCOUNTER — Ambulatory Visit: Payer: Medicaid Other

## 2023-07-14 ENCOUNTER — Ambulatory Visit: Payer: Medicaid Other | Admitting: Orthopedic Surgery

## 2023-07-14 DIAGNOSIS — M25561 Pain in right knee: Secondary | ICD-10-CM

## 2023-07-15 ENCOUNTER — Encounter: Payer: Self-pay | Admitting: Orthopedic Surgery
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
## 2023-08-02 ENCOUNTER — Ambulatory Visit (INDEPENDENT_AMBULATORY_CARE_PROVIDER_SITE_OTHER): Payer: Medicaid Other | Admitting: Podiatry

## 2023-08-02 ENCOUNTER — Encounter: Payer: Self-pay | Admitting: Podiatry

## 2023-08-02 DIAGNOSIS — M2042 Other hammer toe(s) (acquired), left foot: Secondary | ICD-10-CM

## 2023-08-02 DIAGNOSIS — M79675 Pain in left toe(s): Secondary | ICD-10-CM

## 2023-08-02 DIAGNOSIS — M79674 Pain in right toe(s): Secondary | ICD-10-CM | POA: Diagnosis not present

## 2023-08-02 DIAGNOSIS — M722 Plantar fascial fibromatosis: Secondary | ICD-10-CM | POA: Diagnosis not present

## 2023-08-02 DIAGNOSIS — B351 Tinea unguium: Secondary | ICD-10-CM | POA: Diagnosis not present

## 2023-08-02 NOTE — Progress Notes (Unsigned)
Chief Complaint  Patient presents with   Nail Problem    Rm 20 patient is here for routine foot care    HPI: 62 y.o. female presenting today for her scheduled nail care appointment.  She is also requesting a Rx for custom orthotics for her plantar foot pain  Past Medical History:  Diagnosis Date   Arthritis    Cellulitis of left leg 07/07/2012   Charcot's joint of foot    Constipation    Diabetes mellitus    Head injury, closed, with brief LOC ([facility])    High cholesterol    Hypertension    Knee pain, right    Obese    Obesity (BMI 30-39.9) 12/08/2017   Shortness of breath    with too much fluid- not currently    Past Surgical History:  Procedure Laterality Date   carbuncle     back of head   CESAREAN SECTION     COLONOSCOPY     JOINT REPLACEMENT     KNEE SURGERY     quad   QUADRICEPS REPAIR     TOTAL KNEE ARTHROPLASTY Left 08/13/2014   Procedure: TOTAL KNEE ARTHROPLASTY;  [place]: Cammy Copa, MD;  Location: [facility];  Service: Orthopedics;  Laterality: Left;    Allergies  Allergen Reactions   Iodine Anaphylaxis and Hives    Certain iodine in fish   Red Dye #40 (Allura Red) Hives   Shellfish Allergy Anaphylaxis    Iodine breaks her out and "stops her breathing" Iodine breaks her out and "stops her breathing"   Aleve [Naproxen Sodium] Hives   Iohexol Itching    Can possibly tolerate with Benadryl (but ask first??)    Percocet [Oxycodone-Acetaminophen] Hives, Itching and Other (See Comments)    Causes paranoia     Physical Exam: General: The patient is alert and oriented x3 in no acute distress.  Dermatology:  No ecchymosis, erythema, or edema bilateral.  No open lesions.  Nails are 3mm thick, brown in color, with subungual debris, distal onycholysis and pain with compression.  They are all elongated.   Vascular: Palpable pedal pulses bilaterally. Capillary refill within normal limits.  No appreciable edema.    Neurological: Light touch sensation  intact bilateral.  MMT 5/5 to lower extremity bilateral. Negative Tinel's sign with percussion of the posterior tibial nerve on the affected extremity.    Musculoskeletal Exam:  There is pain on palpation of the plantarmedial & plantarcentral aspect of bilateral heel.  No gaps or nodules within the plantar fascia.  Positive Windlass mechanism bilateral.  Antalgic gait noted with first few steps upon standing.  No pain on palpation of achilles tendon bilateral.  Ankle df less than 10 degrees with knee extended b/l.   Assessment/Plan of Care: 1. Pain due to onychomycosis of toenails of both feet   2. Hammertoe of left foot   3. Plantar fasciitis     FOR HOME USE ONLY DME OTHER SEE COMMENT  Mycotic nails were sharply debrided with sterile nail nippers and power debriding burr x10.  Patient would benefit from custom orthotics to provide better overall foot support and decrease arch/heel pain.  Rx will be sent to facility.  Return in about 3 months (around 11/02/2023) for [facility]   Clerance Lav, DPM, FACFAS [facility]     [address]  [place], [place] [address]                Office [phone]  Fax [phone]

## 2023-08-04 ENCOUNTER — Telehealth: Payer: Self-pay | Admitting: Podiatry

## 2023-08-04 NOTE — Telephone Encounter (Signed)
[facility] called and need the detailed notes from her visit faxed over to them for the referral that was sent to them.

## 2023-08-16 ENCOUNTER — Ambulatory Visit: Payer: Medicaid Other

## 2023-10-31 ENCOUNTER — Ambulatory Visit: Payer: Medicaid Other | Admitting: Podiatry

## 2023-11-02 ENCOUNTER — Encounter: Payer: Self-pay | Admitting: Podiatry

## 2023-11-02 ENCOUNTER — Ambulatory Visit (INDEPENDENT_AMBULATORY_CARE_PROVIDER_SITE_OTHER): Payer: Medicaid Other | Admitting: Podiatry

## 2023-11-02 VITALS — Ht 65.0 in | Wt 270.0 lb

## 2023-11-02 DIAGNOSIS — M79675 Pain in left toe(s): Secondary | ICD-10-CM | POA: Diagnosis not present

## 2023-11-02 DIAGNOSIS — M79674 Pain in right toe(s): Secondary | ICD-10-CM

## 2023-11-02 DIAGNOSIS — B351 Tinea unguium: Secondary | ICD-10-CM | POA: Diagnosis not present

## 2023-11-02 DIAGNOSIS — E1151 Type 2 diabetes mellitus with diabetic peripheral angiopathy without gangrene: Secondary | ICD-10-CM

## 2023-11-07 ENCOUNTER — Encounter: Payer: Self-pay | Admitting: Podiatry

## 2023-11-07 NOTE — Progress Notes (Signed)
  Subjective:  Patient ID: [name], female    DOB: 1961-05-17,  MRN: [id number]  Kaitlyn Castillo presents to clinic today for at risk foot care. Pt has h/o NIDDM with PAD and painful elongated mycotic toenails 1-5 bilaterally which are tender when wearing enclosed shoe gear. Pain is relieved with periodic professional debridement.  Chief Complaint  Patient presents with   Nail Problem    Pt is here for [facility], las A1C was 7.0 PCP is Dr Erenest Rasher and LOV was in July.   New problem(s): None.   PCP is Norm Salt, [place].  Allergies  Allergen Reactions   Iodine Anaphylaxis and Hives    Certain iodine in fish   Red Dye #40 (Allura Red) Hives   Shellfish Allergy Anaphylaxis    Iodine breaks her out and "stops her breathing" Iodine breaks her out and "stops her breathing"   Aleve [Naproxen Sodium] Hives   Iohexol Itching    Can possibly tolerate with Benadryl (but ask first??)    Percocet [Oxycodone-Acetaminophen] Hives, Itching and Other (See Comments)    Causes paranoia    Review of Systems: Negative except as noted in the HPI.  Objective: No changes noted in today's physical examination. There were no vitals filed for this visit. Kaitlyn Castillo is a pleasant 62 y.o. female obese in NAD. AAO x 3.  Vascular Examination: CFT <3 seconds b/l LE. Faintly palpable DP pulses b/l LE. Nonpalpable PT pulse(s) b/l LE. No pain with calf compression RLE. Lower extremity skin temperature gradient within normal limits. Trace edema noted BLE. Evidence of chronic venous insufficiency b/l LE. No ischemia or gangrene noted b/l LE. No cyanosis or clubbing noted b/l LE.  Dermatological Examination: Pedal skin is warm and supple b/l LE. No open wounds b/l LE. No interdigital macerations noted b/l LE. Toenails 1-5 b/l elongated, discolored, dystrophic, thickened, crumbly with subungual debris and tenderness to dorsal palpation.   Resolved hyperkeratotic lesion(s) distal tip of L 2nd toe. No  erythema, no edema, no drainage, no fluctuance.  Neurological Examination: Pt has subjective symptoms of neuropathy. Protective sensation intact 5/5 intact bilaterally with 10g monofilament b/l. Vibratory sensation intact b/l.  Musculoskeletal Examination: Muscle strength 5/5 to all lower extremity muscle groups bilaterally. Clawtoe deformity L 2nd toe. Pes planus deformity noted bilateral LE.  Assessment/Plan: 1. Pain due to onychomycosis of toenails of both feet   2. Type II diabetes mellitus with peripheral circulatory disorder (HCC)     -Consent given for treatment as described below: -Examined patient. -Continue foot and shoe inspections daily. Monitor blood glucose per PCP/Endocrinologist's recommendations. -Patient to continue soft, supportive shoe gear daily. -Toenails 1-5 b/l were debrided in length and girth with sterile nail nippers and dremel without iatrogenic bleeding.  -Patient/POA to call should there be question/concern in the interim.   Return in about 3 months (around 02/02/2024).  Freddie Breech, DPM      [place] LOCATION: [address] [place] [address]                   Office [phone]   [facility] LOCATION: [address] [place], [place] [address] Office [phone]

## 2023-11-18 ENCOUNTER — Other Ambulatory Visit: Payer: Self-pay | Admitting: Physician Assistant

## 2023-11-18 DIAGNOSIS — Z1231 Encounter for screening mammogram for malignant neoplasm of breast: Secondary | ICD-10-CM

## 2023-12-22 ENCOUNTER — Ambulatory Visit
Admission: RE | Admit: 2023-12-22 | Discharge: 2023-12-22 | Disposition: A | Discharge status: Home / Self Care | Payer: Medicaid Other | Source: Ambulatory Visit | Attending: Physician Assistant | Admitting: Physician Assistant

## 2023-12-22 DIAGNOSIS — Z1231 Encounter for screening mammogram for malignant neoplasm of breast: Secondary | ICD-10-CM

## 2024-02-06 ENCOUNTER — Ambulatory Visit (INDEPENDENT_AMBULATORY_CARE_PROVIDER_SITE_OTHER): Payer: Medicaid Other | Admitting: Podiatry

## 2024-02-06 ENCOUNTER — Encounter: Payer: Self-pay | Admitting: Podiatry

## 2024-02-06 DIAGNOSIS — M79674 Pain in right toe(s): Secondary | ICD-10-CM | POA: Diagnosis not present

## 2024-02-06 DIAGNOSIS — M79675 Pain in left toe(s): Secondary | ICD-10-CM

## 2024-02-06 DIAGNOSIS — E1151 Type 2 diabetes mellitus with diabetic peripheral angiopathy without gangrene: Secondary | ICD-10-CM

## 2024-02-06 DIAGNOSIS — B351 Tinea unguium: Secondary | ICD-10-CM | POA: Diagnosis not present

## 2024-02-06 NOTE — Progress Notes (Signed)
  Subjective:  Patient ID: [name], female    DOB: January 03, 1961,   MRN: [id number]  Chief Complaint  Patient presents with   Nail Problem    rfc    63 y.o. female presents for concern of thickened elongated and painful nails that are difficult to trim. Requesting to have them trimmed today. Relates burning and tingling in their feet. Patient is diabetic and last A1c was  Lab Results  Component Value Date   HGBA1C 7.8 (H) 12/06/2017   .   PCP:  Norm Salt, PA    . Denies any other pedal complaints. Denies n/v/f/c.   Past Medical History:  Diagnosis Date   Arthritis    Cellulitis of left leg 07/07/2012   Charcot's joint of foot    Constipation    Diabetes mellitus    Head injury, closed, with brief LOC (HCC)    High cholesterol    Hypertension    Knee pain, right    Obese    Obesity (BMI 30-39.9) 12/08/2017   Shortness of breath    with too much fluid- not currently    Objective:  Physical Exam: Vascular: DP/PT pulses 2/4 bilateral. CFT <3 seconds. Absent hair growth on digits. Edema noted to bilateral lower extremities. Xerosis noted bilaterally.  Skin. No lacerations or abrasions bilateral feet. Nails 1-5 bilateral  are thickened discolored and elongated with subungual debris.  Musculoskeletal: MMT 5/5 bilateral lower extremities in DF, PF, Inversion and Eversion. Deceased ROM in DF of ankle joint.  Neurological: Sensation intact to light touch. Protective sensation  intact bilateral.    Assessment:   1. Pain due to onychomycosis of toenails of both feet   2. Type II diabetes mellitus with peripheral circulatory disorder (HCC)      Plan:  Patient was evaluated and treated and all questions answered. -Discussed and educated patient on diabetic foot care, especially with  regards to the vascular, neurological and musculoskeletal systems.  -Stressed the importance of good glycemic control and the detriment of not  controlling glucose levels in relation  to the foot. -Discussed supportive shoes at all times and checking feet regularly.  -Mechanically debrided all nails 1-5 bilateral using sterile nail nipper and filed with dremel without incident  -Answered all patient questions -Patient to return  in 3 months for at risk foot care -Patient advised to call the office if any problems or questions arise in the meantime.   Louann Sjogren, DPM

## 2024-03-01 ENCOUNTER — Other Ambulatory Visit: Payer: Self-pay

## 2024-03-01 ENCOUNTER — Encounter (HOSPITAL_COMMUNITY): Payer: Self-pay | Admitting: Emergency Medicine

## 2024-03-01 ENCOUNTER — Ambulatory Visit (HOSPITAL_COMMUNITY)
Admission: EM | Admit: 2024-03-01 | Discharge: 2024-03-01 | Disposition: A | Discharge status: Home / Self Care | Attending: Nurse Practitioner | Admitting: Nurse Practitioner

## 2024-03-01 DIAGNOSIS — J069 Acute upper respiratory infection, unspecified: Secondary | ICD-10-CM

## 2024-03-01 LAB — POC COVID19/FLU A&B COMBO
Covid Antigen, POC: NEGATIVE
Influenza A Antigen, POC: NEGATIVE
Influenza B Antigen, POC: NEGATIVE

## 2024-03-01 MED ORDER — BENZONATATE 100 MG PO CAPS: 100.0000 mg | ORAL_CAPSULE | Freq: Three times a day (TID) | ORAL | 0 refills | Status: AC | PRN

## 2024-03-01 NOTE — ED Provider Notes (Signed)
 [facility]    CSN: [id number] Arrival date & time: 03/01/24  1430      History   Chief Complaint Chief Complaint  Patient presents with   Cough    HPI Kaitlyn Castillo is a 63 y.o. female.   Patient presents today with 2-day history of chills, tonsil soreness, cough that now feels congested, decreased appetite, and fatigue.  She denies known fevers, runny or stuffy nose, headache, sore throat, abdominal pain, nausea, vomiting, or diarrhea.  She has been taking TheraFlu and recall of her symptoms with minimal temporary improvement.    Past Medical History:  Diagnosis Date   Arthritis    Cellulitis of left leg 07/07/2012   Charcot's joint of foot    Constipation    Diabetes mellitus    Head injury, closed, with brief LOC (HCC)    High cholesterol    Hypertension    Knee pain, right    Obese    Obesity (BMI 30-39.9) 12/08/2017   Shortness of breath    with too much fluid- not currently    Patient Active Problem List   Diagnosis Date Noted   Hyperlipidemia 10/12/2021   Adjustment disorder 11/21/2020   Anemia 11/21/2020   Diabetic renal disease (HCC) 11/21/2020   Hyperglycemia due to type 2 diabetes mellitus (HCC) 11/21/2020   Low back pain 11/21/2020   Recurrent major depression (HCC) 11/21/2020   Obesity due to excess calories 02/28/2020   Obesity (BMI 30-39.9) 12/08/2017   Streptococcal bacteremia 12/06/2017   Uncontrolled type 2 diabetes mellitus with hyperglycemia (HCC) 12/06/2017   Hyponatremia 12/06/2017   Postmenopausal bleeding 08/27/2015   Adenomyosis 08/27/2015   Skin ulcer of left foot including toes (HCC) 05/02/2015   Equinus deformity of foot, acquired 04/02/2015   Tenosynovitis of left foot 04/02/2015   Arthritis of knee 08/13/2014   Onychomycosis 11/02/2013   Acquired deformity of toe 11/02/2013   Venous (peripheral) insufficiency 09/13/2012   Cellulitis of left leg 07/07/2012   Anxiety state 11/19/2007   DIABETES MELLITUS, TYPE  II 07/29/2007   DEPRESSION 07/29/2007   Essential hypertension 07/29/2007    Past Surgical History:  Procedure Laterality Date   carbuncle     back of head   CESAREAN SECTION     COLONOSCOPY     KNEE SURGERY     quad   QUADRICEPS REPAIR     TOTAL KNEE ARTHROPLASTY Left 08/13/2014   Procedure: TOTAL KNEE ARTHROPLASTY;  [place]: Cammy Copa, MD;  Location: [facility] OR;  Service: Orthopedics;  Laterality: Left;    OB History     Gravida  8   Para  4   Term  4   Preterm      AB  4   Living  4      SAB      IAB  4   Ectopic      Multiple      Live Births  4            Home Medications    Prior to Admission medications   Medication Sig Start Date End Date Taking? Authorizing Provider  benzonatate (TESSALON) 100 MG capsule Take 1 capsule (100 mg total) by mouth 3 (three) times daily as needed for cough. Do not take with alcohol or while operating or driving heavy machinery 03/21/80  Yes Valentino Nose, NP  ACCU-CHEK AVIVA PLUS test strip 3 (three) times daily. for testing 10/03/18   [provider]  ACCU-CHEK FASTCLIX LANCETS  MISC TEST QID 09/02/18   [provider]  acetaminophen (TYLENOL) 325 MG tablet Take 650 mg by mouth every 6 (six) hours as needed.    [provider]  atenolol (TENORMIN) 25 MG tablet Take by mouth.    [provider]  atorvastatin (LIPITOR) 40 MG tablet Take 1 tablet by mouth daily. 02/18/21   [provider]  diclofenac Sodium (VOLTAREN) 1 % GEL Apply 2 g topically 4 (four) times daily. 12/19/20   [provider]  Dulaglutide (TRULICITY) 3 MG/0.5ML SOPN 3 mg subcutaneously once a week    [provider]  glipiZIDE (GLUCOTROL XL) 5 MG 24 hr tablet Take 5 mg by mouth daily. 09/05/20   [provider]  ketoconazole (NIZORAL) 2 % cream Apply to both feet and between toes once daily for 6 weeks. 12/17/22   Freddie Breech, DPM  lisinopril-hydrochlorothiazide  (ZESTORETIC) 20-25 MG tablet Take 2 tablets by mouth daily. 03/24/22   [provider]  metFORMIN (GLUCOPHAGE) 500 MG tablet Take 1 tablet by mouth daily. 03/18/19   [provider]  PATADAY 0.1 % ophthalmic solution 1 drop 2 (two) times daily. 11/10/22   [provider]  phentermine 37.5 MG capsule Take by mouth.    [provider]    Family History Family History  Problem Relation Age of Onset   Diabetes Mother    Hyperlipidemia Mother    Hypertension Mother    Diabetes Father    Hyperlipidemia Father    Hypertension Father    Cancer Brother    Heart disease Brother    Hypertension Brother    Heart attack Brother    Colon cancer Neg Hx    Esophageal cancer Neg Hx    Rectal cancer Neg Hx    Stomach cancer Neg Hx     Social History Social History   Tobacco Use   Smoking status: Former    Current packs/day: 0.00    Types: Cigarettes    Start date: 12/13/1978    Quit date: 12/13/1978    Years since quitting: 45.2   Smokeless tobacco: Never  Vaping Use   Vaping status: Never Used  Substance Use Topics   Alcohol use: No    Alcohol/week: 0.0 standard drinks of alcohol   Drug use: No     Allergies   Iodine, Red dye #40 (allura red), Shellfish allergy, Aleve [naproxen sodium], Iohexol, and Percocet [oxycodone-acetaminophen]   Review of Systems Review of Systems Per HPI  Physical Exam Triage Vital Signs ED Triage Vitals  Encounter Vitals Group     BP 03/01/24 1536 (!) 162/81     Systolic BP Percentile --      Diastolic BP Percentile --      Pulse Rate 03/01/24 1536 86     Resp 03/01/24 1536 18     Temp 03/01/24 1536 100.3 F (37.9 C)     Temp Source 03/01/24 1536 Oral     SpO2 03/01/24 1536 96 %     Weight --      Height --      Head Circumference --      Peak Flow --      Pain Score 03/01/24 1532 9     Pain Loc --      Pain Education --      Exclude from Growth Chart --    No data found.  Updated Vital Signs BP (!)  162/81 (BP Location: Left Arm) Comment (BP Location): large cuff  Pulse 86   Temp 100.3 F (37.9 C) (Oral)   Resp 18   LMP 08/22/2015   SpO2 96%   Visual Acuity Right Eye Distance:   Left Eye Distance:   Bilateral Distance:    Right Eye Near:   Left Eye Near:    Bilateral Near:     Physical Exam Vitals and nursing note reviewed.  Constitutional:      General: She is not in acute distress.    Appearance: Normal appearance. She is not ill-appearing or toxic-appearing.  HENT:     Head: Normocephalic and atraumatic.     Right Ear: Tympanic membrane, ear canal and external ear normal.     Left Ear: Tympanic membrane, ear canal and external ear normal.     Nose: No congestion or rhinorrhea.     Mouth/Throat:     Mouth: Mucous membranes are moist.     Pharynx: Oropharynx is clear. No oropharyngeal exudate or posterior oropharyngeal erythema.  Eyes:     General: No scleral icterus.    Extraocular Movements: Extraocular movements intact.  Cardiovascular:     Rate and Rhythm: Normal rate and regular rhythm.  Pulmonary:     Effort: Pulmonary effort is normal. No respiratory distress.     Breath sounds: Normal breath sounds. No wheezing, rhonchi or rales.  Musculoskeletal:     Cervical back: Normal range of motion and neck supple.  Lymphadenopathy:     Cervical: No cervical adenopathy.  Skin:    General: Skin is warm and dry.     Coloration: Skin is not jaundiced or pale.     Findings: No erythema or rash.  Neurological:     Mental Status: She is alert and oriented to person, place, and time.  Psychiatric:        Behavior: Behavior is cooperative.      UC Treatments / Results  Labs (all labs ordered are listed, but only abnormal results are displayed) Labs Reviewed  POC COVID19/FLU A&B COMBO    EKG   Radiology No results found.  Procedures Procedures (including critical care time)  Medications Ordered in UC Medications - No data to display  Initial  Impression / Assessment and Plan / UC Course  I have reviewed the triage vital signs and the nursing notes.  Pertinent labs & imaging results that were available during my care of the patient were reviewed by me and considered in my medical decision making (see chart for details).   Patient is mildly hypertensive in triage, otherwise vital signs are stable.  1. Viral URI with cough Suspect viral etiology Vitals and exam are stable today COVID-19 and influenza tests are negative Supportive care discussed with patient; start cough suppressant medication ER and return precautions discussed  The patient was given the opportunity to ask questions.  All questions answered to their satisfaction.  The patient is in agreement to this plan.   Final Clinical Impressions(s) / UC Diagnoses   Final diagnoses:  Viral URI with cough     Discharge Instructions      You have a viral upper respiratory infection.  Symptoms should improve over the next week to 10 days.  If you develop chest pain or shortness of breath, go to the emergency room.  COVID-19 and influenza test is negative today.  Some things that can make you feel better are: - Increased rest - Increasing fluid with water/sugar free electrolytes - Acetaminophen and ibuprofen as needed for fever/pain - Salt water gargling, chloraseptic  spray and throat lozenges - OTC guaifenesin (Mucinex) 600 mg twice daily - Saline sinus flushes or a neti pot - Humidifying the air -Tessalon Perles every 8 hours as needed for dry cough     ED Prescriptions     Medication Sig Dispense Auth. Provider   benzonatate (TESSALON) 100 MG capsule Take 1 capsule (100 mg total) by mouth 3 (three) times daily as needed for cough. Do not take with alcohol or while operating or driving heavy machinery 21 capsule Valentino Nose, NP      PDMP not reviewed this encounter.   Valentino Nose, NP 03/01/24 [id number]

## 2024-03-01 NOTE — Discharge Instructions (Signed)
 You have a viral upper respiratory infection.  Symptoms should improve over the next week to 10 days.  If you develop chest pain or shortness of breath, go to the emergency room.  COVID-19 and influenza test is negative today.    Some things that can make you feel better are: - Increased rest - Increasing fluid with water/sugar free electrolytes - Acetaminophen and ibuprofen as needed for fever/pain - Salt water gargling, chloraseptic spray and throat lozenges - OTC guaifenesin (Mucinex) 600 mg twice daily - Saline sinus flushes or a neti pot - Humidifying the air -Tessalon Perles every 8 hours as needed for dry cough

## 2024-03-01 NOTE — ED Triage Notes (Signed)
 Symptoms started Tuesday night.  (3/18)  Last bm was today-no bm for the last 2 days.  Right side of tonsil is sore Patient is complaining of chills.  Patient last ate on Tuesday afternoon. Patient has a frequent cough.    Patient did take theraflu 2 days ago.  Taking riccola

## 2024-05-01 ENCOUNTER — Ambulatory Visit (INDEPENDENT_AMBULATORY_CARE_PROVIDER_SITE_OTHER): Payer: Medicaid Other | Admitting: Podiatry

## 2024-05-01 ENCOUNTER — Encounter: Payer: Self-pay | Admitting: Podiatry

## 2024-05-01 DIAGNOSIS — M79674 Pain in right toe(s): Secondary | ICD-10-CM | POA: Diagnosis not present

## 2024-05-01 DIAGNOSIS — M79675 Pain in left toe(s): Secondary | ICD-10-CM

## 2024-05-01 DIAGNOSIS — B351 Tinea unguium: Secondary | ICD-10-CM | POA: Diagnosis not present

## 2024-05-01 DIAGNOSIS — E1151 Type 2 diabetes mellitus with diabetic peripheral angiopathy without gangrene: Secondary | ICD-10-CM | POA: Diagnosis not present

## 2024-05-01 NOTE — Progress Notes (Unsigned)
  Subjective:  Patient ID: [name], female    DOB: 1961-10-12,  MRN: [id number]  Kaitlyn Castillo presents to clinic today for at risk foot care. Pt has h/o NIDDM with PAD and painful, elongated thickened toenails x 10 which are symptomatic when wearing enclosed shoe gear. This interferes with his/her daily activities. No chief complaint on file.  New problem(s): None. {[id number]}  PCP is Dianah Fort, [place]. LOV 04/09/2024.  Allergies  Allergen Reactions   Iodine Anaphylaxis and Hives    Certain iodine in fish   Red Dye #40 (Allura Red) Hives   Shellfish Allergy Anaphylaxis    Iodine breaks her out and "stops her breathing" Iodine breaks her out and "stops her breathing"   Aleve [Naproxen Sodium] Hives   Iohexol Itching    Can possibly tolerate with Benadryl (but ask first??)    Percocet [Oxycodone-Acetaminophen ] Hives, Itching and Other (See Comments)    Causes paranoia    Review of Systems: Negative except as noted in the HPI.  Objective: No changes noted in today's physical examination. There were no vitals filed for this visit. Kaitlyn Castillo is a pleasant 63 y.o. female obese in NAD. AAO x 3.  Vascular Examination: CFT <3 seconds b/l LE. Faintly palpable DP pulses b/l LE. Nonpalpable PT pulse(s) b/l LE. No pain with calf compression RLE. Lower extremity skin temperature gradient within normal limits. Trace edema noted BLE. Evidence of chronic venous insufficiency b/l LE. No ischemia or gangrene noted b/l LE. No cyanosis or clubbing noted b/l LE.  Dermatological Examination: Pedal skin is warm and supple b/l LE. No open wounds b/l LE. No interdigital macerations noted b/l LE. Toenails 1-5 b/l elongated, discolored, dystrophic, thickened, crumbly with subungual debris and tenderness to dorsal palpation.   Resolved hyperkeratotic lesion(s) distal tip of L 2nd toe. No erythema, no edema, no drainage, no fluctuance.  Neurological Examination: Pt has  subjective symptoms of neuropathy. Protective sensation intact 5/5 intact bilaterally with 10g monofilament b/l. Vibratory sensation intact b/l.  Musculoskeletal Examination: Muscle strength 5/5 to all lower extremity muscle groups bilaterally. Clawtoe deformity L 2nd toe. Pes planus deformity noted bilateral LE.  Assessment/Plan: 1. Pain due to onychomycosis of toenails of both feet   2. Type II diabetes mellitus with peripheral circulatory disorder (HCC)     No orders of the defined types were placed in this encounter.   None Patient was evaluated and treated. All patient's and/or POA's questions/concerns addressed on today's visit. Mycotic toenails 1-5 debrided in length and girth without incident.  Continue daily foot inspections and monitor blood glucose per PCP/Endocrinologist's recommendations.Continue soft, supportive shoe gear daily. Report any pedal injuries to medical professional. Call office if there are any quesitons/concerns. -Patient/POA to call should there be question/concern in the interim.   Return in about 3 months (around 08/01/2024).  Kaitlyn Castillo, DPM      [place] LOCATION: [address], [place] [address]                   Office [phone]   [facility] LOCATION: [address] [place], [place] [address] Office [phone]

## 2024-05-06 ENCOUNTER — Encounter: Payer: Self-pay | Admitting: Podiatry

## 2024-08-08 ENCOUNTER — Ambulatory Visit: Admitting: Podiatry

## 2024-08-08 ENCOUNTER — Encounter: Payer: Self-pay | Admitting: Podiatry

## 2024-08-08 DIAGNOSIS — E1151 Type 2 diabetes mellitus with diabetic peripheral angiopathy without gangrene: Secondary | ICD-10-CM

## 2024-08-08 DIAGNOSIS — M79674 Pain in right toe(s): Secondary | ICD-10-CM | POA: Diagnosis not present

## 2024-08-08 DIAGNOSIS — B351 Tinea unguium: Secondary | ICD-10-CM | POA: Diagnosis not present

## 2024-08-08 DIAGNOSIS — M79675 Pain in left toe(s): Secondary | ICD-10-CM | POA: Diagnosis not present

## 2024-08-08 DIAGNOSIS — H1045 Other chronic allergic conjunctivitis: Secondary | ICD-10-CM | POA: Insufficient documentation

## 2024-08-08 NOTE — Progress Notes (Signed)
  Subjective:  Patient ID: [name], female    DOB: 1961-03-16,  MRN: [id number]  Kaitlyn Castillo presents to clinic today for at risk foot care. Pt has h/o NIDDM with PAD and painful mycotic toenails of both feet that are difficult to trim. Pain interferes with daily activities and wearing enclosed shoe gear comfortably.  Chief Complaint  Patient presents with   Diabetes    DFC NIDDM A1C 7.8. Toenail trim. LOV with PCP 07/31/2024.   New problem(s): None.   PCP is Rosalea Rosina SAILOR, [place].  Allergies  Allergen Reactions   Iodine Anaphylaxis and Hives    Certain iodine in fish   Red Dye #40 (Allura Red) Hives   Shellfish Allergy Anaphylaxis    Iodine breaks her out and stops her breathing Iodine breaks her out and stops her breathing   Aleve [Naproxen Sodium] Hives   Iohexol Itching    Can possibly tolerate with Benadryl (but ask first??)    Percocet [Oxycodone-Acetaminophen ] Hives, Itching and Other (See Comments)    Causes paranoia    Review of Systems: Negative except as noted in the HPI.  Objective: No changes noted in today's physical examination. There were no vitals filed for this visit. Kaitlyn Castillo is a pleasant 63 y.o. female in NAD. AAO x 3.  Vascular Examination: CFT <3 seconds b/l. DP pulses faintly palpable b/l. PT pulses nonpalpable b/l. Digital hair absent. Skin temperature gradient warm to warm b/l. No pain with calf compression. No ischemia or gangrene. No cyanosis or clubbing noted b/l. Pedal hair absent. Trace edema noted BLE. Evidence of chronic venous insufficiency b/l LE.   Neurological Examination: Sensation grossly intact b/l with 10 gram monofilament. Vibratory sensation intact b/l. Pt has subjective symptoms of neuropathy.  Dermatological Examination: Pedal skin warm and supple b/l. No open wounds b/l. No interdigital macerations. Toenails 1-5 b/l thick, discolored, elongated with subungual debris and pain on dorsal palpation. No  hyperkeratotic nor porokeratotic lesions.  Musculoskeletal Examination: Muscle strength 5/5 to all lower extremity muscle groups bilaterally. Clawtoe deformity left second digit. Pes planus deformity noted bilateral LE.  Radiographs: None  Assessment/Plan: 1. Pain due to onychomycosis of toenails of both feet   2. Type II diabetes mellitus with peripheral circulatory disorder [facility])    Consent given for treatment. Patient examined. All patient's and/or POA's questions/concerns addressed on today's visit. Toenails 1-5 debrided in length and girth without incident. Continue foot and shoe inspections daily. Monitor blood glucose per PCP/Endocrinologist's recommendations. Continue soft, supportive shoe gear daily. Report any pedal injuries to medical professional. Call office if there are any questions/concerns. -Patient/POA to call should there be question/concern in the interim.   Return in about 3 months (around 11/08/2024).  Kaitlyn Castillo, DPM      [place] LOCATION: [address] [place] [address]                   Office [phone]   [facility] LOCATION: [address] [place], [place] [address] Office [phone]

## 2024-08-11 ENCOUNTER — Encounter: Payer: Self-pay | Admitting: Podiatry

## 2024-11-21 ENCOUNTER — Ambulatory Visit: Admitting: Podiatry

## 2024-11-21 DIAGNOSIS — Z0189 Encounter for other specified special examinations: Secondary | ICD-10-CM

## 2024-11-21 DIAGNOSIS — M79674 Pain in right toe(s): Secondary | ICD-10-CM | POA: Diagnosis not present

## 2024-11-21 DIAGNOSIS — E1151 Type 2 diabetes mellitus with diabetic peripheral angiopathy without gangrene: Secondary | ICD-10-CM

## 2024-11-21 DIAGNOSIS — B351 Tinea unguium: Secondary | ICD-10-CM | POA: Diagnosis not present

## 2024-11-21 DIAGNOSIS — M79675 Pain in left toe(s): Secondary | ICD-10-CM | POA: Diagnosis not present

## 2024-11-21 DIAGNOSIS — M2042 Other hammer toe(s) (acquired), left foot: Secondary | ICD-10-CM

## 2024-11-21 DIAGNOSIS — E119 Type 2 diabetes mellitus without complications: Secondary | ICD-10-CM

## 2024-11-21 DIAGNOSIS — M2141 Flat foot [pes planus] (acquired), right foot: Secondary | ICD-10-CM

## 2024-11-21 DIAGNOSIS — M2142 Flat foot [pes planus] (acquired), left foot: Secondary | ICD-10-CM

## 2024-11-28 ENCOUNTER — Encounter: Payer: Self-pay | Admitting: Podiatry

## 2024-11-28 NOTE — Progress Notes (Signed)
 Subjective:  Patient ID: [name], female    DOB: 1961/02/17,  MRN: [id number]  Kaitlyn Castillo presents to clinic today for for annual diabetic foot examination and painful mycotic toenails of both feet that are difficult to trim. Pain interferes with daily activities and wearing enclosed shoe gear comfortably. Patient has h/o fracture of left great toe. Chief Complaint  Patient presents with   [facility]    Rm15 Diabetic foot care/ Dr. Rosina Amy last visit September 2025/ A1c 7.8   New problem(s): None.   PCP is Amy Rosina SAILOR, [place].  Allergies[1]  Review of Systems: Negative except as noted in the HPI.  Objective: No changes noted in today's physical examination. There were no vitals filed for this visit. Kaitlyn Castillo is a pleasant 63 y.o. female in NAD. AAO x 3.   Diabetic foot exam was performed with the following findings:   Vascular Examination: CFT <3 seconds b/l. DP pulses faintly palpable b/l. PT pulses nonpalpable b/l. Digital hair absent. Skin temperature gradient warm to warm b/l. No pain with calf compression. No ischemia or gangrene. No cyanosis or clubbing noted b/l. Trace edema noted BLE. Evidence of CVI b/l.  Neurological Examination: Sensation grossly intact b/l with 10 gram monofilament. Vibratory sensation intact b/l. Pt has subjective symptoms of neuropathy.  Dermatological Examination: Pedal skin warm and supple b/l. No open wounds b/l. No interdigital macerations. Toenails 1-5 b/l thick, discolored, elongated with subungual debris and pain on dorsal palpation. No hyperkeratotic nor porokeratotic lesions.  Musculoskeletal Examination: Muscle strength 5/5 to all lower extremity muscle groups bilaterally. Pes planus deformity noted bilateral LE. Patient ambulates independent of any assistive aids.   Radiographs: None   Assessment/Plan: 1. Pain due to onychomycosis of toenails of both feet   2. Hammertoe of left foot   3. Pes planus of  both feet   4. Type II diabetes mellitus with peripheral circulatory disorder (HCC)   5. Encounter for diabetic foot exam (HCC)   Diabetic foot examination performed today. All patient's and/or POA's questions/concerns addressed on today's visit. Toenails 1-5 b/l debrided in length and girth without incident. Continue foot and shoe inspections daily. Monitor blood glucose per PCP/Endocrinologist's recommendations. Continue soft, supportive shoe gear daily. Report any pedal injuries to medical professional. Call office if there are any questions/concerns. -Patient/POA to call should there be question/concern in the interim.   Return in about 3 months (around 02/19/2025).  Delon LITTIE Merlin, DPM       LOCATION: [address] [place], [place] [address]                   Office [phone]   [place] LOCATION: [address] [place], [place] [address] Office [phone]     [1]  Allergies Allergen Reactions   Iodine Anaphylaxis and Hives    Certain iodine in fish   Red Dye #40 (Allura Red) Hives   Shellfish Allergy Anaphylaxis    Iodine breaks her out and stops her breathing Iodine breaks her out and stops her breathing   Aleve [Naproxen Sodium] Hives   Iohexol Itching    Can possibly tolerate with Benadryl (but ask first??)    Percocet [Oxycodone-Acetaminophen ] Hives, Itching and Other (  See Comments)    Causes paranoia

## 2024-11-29 ENCOUNTER — Ambulatory Visit: Admitting: Podiatry

## 2024-11-29 ENCOUNTER — Ambulatory Visit

## 2024-11-29 VITALS — Ht 65.0 in | Wt 270.0 lb

## 2024-11-29 DIAGNOSIS — M79675 Pain in left toe(s): Secondary | ICD-10-CM | POA: Diagnosis not present

## 2024-11-29 DIAGNOSIS — M205X2 Other deformities of toe(s) (acquired), left foot: Secondary | ICD-10-CM | POA: Diagnosis not present

## 2024-12-02 NOTE — Progress Notes (Signed)
"  °  Subjective:  Patient ID: Kaitlyn Castillo, female    DOB: [birth year],  MRN: [id number]  Chief Complaint  Patient presents with   Toe Pain    Rm 21 Patient is here for left great toe pain. Patient states left hallux is sensitive to the touch , unable to wear different types of shoes.    63 y.o. female presents with the above complaint. History confirmed with patient.  She returns for follow-up with worsening left great toe pain and deformity, started to affect ability to wear shoes and ambulate and exercise effectively.  Objective:  Physical Exam: warm, good capillary refill, no trophic changes or ulcerative lesions, normal DP and PT pulses, normal sensory exam, and her left hallux has hallux abductus and hallux valgus and hallux interphalangeus deformity there is palpable spurring on the dorsal medial first MTP and interphalangeal joints, pain in the first MTP at the medial first metatarsal and limited range of motion   Radiographs: Multiple views x-ray of the left foot: Significant hallux interphalangeus and hallux valgus deformity with end-stage arthrosis of the metatarsal phalangeal and interphalangeal joints Assessment:   1. Hallux limitus, left      Plan:  Patient was evaluated and treated and all questions answered.  Discussed the etiology and treatment including surgical and non surgical treatment for painful bunion deformity hallux interphalangeus deformity with arthritis of the left great toe.  We reviewed her x-rays together and discussed the severity of the arthritis.    She  has exhausted all non surgical treatment prior to this visit including shoe gear changes and padding. She desires surgical intervention. We discussed all risks including but not limited to: pain, swelling, infection, scar, numbness which may be temporary or permanent, chronic pain, stiffness, nerve pain or damage, wound healing problems, bone healing problems including delayed or non-union and recurrence.  Specifically we discussed the following procedures: Left first MTP and interphalangeal joint fusion versus cheilectomy.  We discussed the risk benefits symptoms and complications of both routes as well as the recovery process.  She elected for cheilectomy due to the easier recovery and I think this is reasonable considering her lack of pain at the interphalangeal joint we discussed this will not entirely straighten the toe out but would remove the spurring and allow for easier shoe gear to be worn.. Informed consent was signed today. Surgery will be scheduled at a mutually agreeable date. Information regarding this will be forwarded to our surgery scheduler. In the interim until surgery I recommended utilizing as wide of shoes as possible, take NSAIDs or tylenol  as tolerated for pain, and a bunion padding shield which can be purchased online.  Her most recent A1c is 6.5% on 11/28/2024    Surgical plan:  Procedure: - Left great toe cheilectomy  Location: - [facility]  Anesthesia plan: - MAC with local  Postoperative pain plan: - Tylenol  1000 mg every 6 hours, tramadol 50 mg every 6 hours as needed  DVT prophylaxis: - ASA 81 mg twice daily  WB Restrictions / DME needs: - Weightbearing as tolerated in surgical shoe  No follow-ups on file.   "

## 2024-12-10 ENCOUNTER — Telehealth: Payer: Self-pay | Admitting: Orthopedic Surgery

## 2024-12-10 NOTE — Telephone Encounter (Signed)
 [place] for brace

## 2024-12-10 NOTE — Telephone Encounter (Signed)
 Pt states she's having severe right knee pain and wants to know if she can go back to wearing her brace.

## 2024-12-11 NOTE — Telephone Encounter (Signed)
IC advised per Dr Dean 

## 2025-01-02 ENCOUNTER — Telehealth: Payer: Self-pay | Admitting: Podiatry

## 2025-01-02 NOTE — Telephone Encounter (Signed)
 Called and scheduled patient for surgery on 01/25/2025. Patient not on any blood thinners, but does tak a GLP1 and has been advised to d/c use prior to surgery. Pharmacy correct in chart.

## 2025-01-04 ENCOUNTER — Other Ambulatory Visit: Payer: Self-pay

## 2025-01-04 ENCOUNTER — Telehealth: Payer: Self-pay | Admitting: Podiatry

## 2025-01-04 ENCOUNTER — Ambulatory Visit: Admitting: Orthopedic Surgery

## 2025-01-04 DIAGNOSIS — M25561 Pain in right knee: Secondary | ICD-10-CM

## 2025-01-04 NOTE — Telephone Encounter (Signed)
 DOS- 01/25/2025  HALLUX RIGIDUS CORRECTION WITH CHEILECTOMY, DEBRIDEMENT AND CAPSULAR RELEASE OF FIRST METATARSOPHALANGEAL JOINT; WITHOUT IMPLANT LT- 28289  [facility] EFFECTIVE DATE- 06/12/2024  PER FAX RECEIVED FROM [employer], PRIOR AUTH FOR CPT CODE [id number] HAS BEEN APPROVED FROM 01/25/2025-04/25/2025. AUTH# [id number]

## 2025-01-05 ENCOUNTER — Encounter: Payer: Self-pay | Admitting: Orthopedic Surgery

## 2025-01-05 NOTE — Progress Notes (Signed)
 "  Office Visit Note   Patient: Kaitlyn Castillo           Date of Birth: 1961-02-13           MRN: [id number] Visit Date: 01/04/2025 Requested by: Rosalea Rosina SAILOR, PA [address] [place],  [place] [address] PCP: Rosalea Rosina SAILOR, PA  Subjective: Chief Complaint  Patient presents with   Knee Pain    HPI: Kaitlyn Castillo is a 64 y.o. female who presents to the office reporting right knee pain.  Patient describes medial pain.  Pain does wake her from sleep at night.  States she cannot really do anything.  Has a history of partial quad rupture status post repair.  Describes pain beneath the patella.  Also reports some weakness and giving way.  Does better with rest.  Has had symptoms for 4 weeks.  Taking Tylenol .  She actually stopped working out.  States the knee does buckle at times.  Cold weather change has actually been hurting the right knee.  She states it has gotten better with decreased activity.  She does break out when she takes Aleve..                ROS: All systems reviewed are negative as they relate to the chief complaint within the history of present illness.  Patient denies fevers or chills.  Assessment & Plan: Visit Diagnoses:  1. Right knee pain, unspecified chronicity     Plan: Impression is right knee pain with likely some exacerbation of medial compartment arthritis.  We talked about an injection today.  She wants to hold off on that intervention for now.  Will also try her with a hinged knee brace for when she is walking around in the house.  Follow-up as needed.  I think she is okay to resume working out as her pain allows.  Follow-Up Instructions: No follow-ups on file.   Orders:  Orders Placed This Encounter  Procedures   XR KNEE 3 VIEW RIGHT   No orders of the defined types were placed in this encounter.     Procedures: No procedures performed   Clinical Data: No additional findings.  Objective: Vital Signs: LMP 08/22/2015   Physical  Exam:  Constitutional: Patient appears well-developed HEENT:  Head: Normocephalic Eyes:EOM are normal Neck: Normal range of motion Cardiovascular: Normal rate Pulmonary/chest: Effort normal Neurologic: Patient is alert Skin: Skin is warm Psychiatric: Patient has normal mood and affect  Ortho Exam: Ortho exam demonstrates passive range of motion of 0-1 05.  Has about a 5 degree extensor lag.  Collaterals are stable.  Mild medial joint line tenderness.  Extensor mechanism intact.  No effusion is present.  Specialty Comments:  No specialty comments available.  Imaging: No results found.   PMFS History: Patient Active Problem List   Diagnosis Date Noted   Chronic allergic conjunctivitis 08/08/2024   Bilateral sensorineural hearing loss 06/24/2023   Impacted cerumen, right ear 06/20/2023   Hyperlipidemia 10/12/2021   Adjustment disorder 11/21/2020   Anemia 11/21/2020   Diabetic renal disease (HCC) 11/21/2020   Hyperglycemia due to type 2 diabetes mellitus (HCC) 11/21/2020   Low back pain 11/21/2020   Recurrent major depression 11/21/2020   Obesity due to excess calories 02/28/2020   Obesity (BMI 30-39.9) 12/08/2017   Streptococcal bacteremia 12/06/2017   Uncontrolled type 2 diabetes mellitus with hyperglycemia (HCC) 12/06/2017   Hyponatremia 12/06/2017   Postmenopausal bleeding 08/27/2015   Adenomyosis 08/27/2015   Skin ulcer of left  foot including toes (HCC) 05/02/2015   Equinus deformity of foot, acquired 04/02/2015   Tenosynovitis of left foot 04/02/2015   Arthritis of knee 08/13/2014   Onychomycosis 11/02/2013   Acquired deformity of toe 11/02/2013   Venous (peripheral) insufficiency 09/13/2012   Cellulitis of left leg 07/07/2012   Anxiety state 11/19/2007   DIABETES MELLITUS, TYPE II 07/29/2007   DEPRESSION 07/29/2007   Essential hypertension 07/29/2007   Past Medical History:  Diagnosis Date   Arthritis    Cellulitis of left leg 07/07/2012   Charcot's joint  of foot    Constipation    Diabetes mellitus    Head injury, closed, with brief LOC (HCC)    High cholesterol    Hypertension    Knee pain, right    Obese    Obesity (BMI 30-39.9) 12/08/2017   Shortness of breath    with too much fluid- not currently    Family History  Problem Relation Age of Onset   Diabetes Mother    Hyperlipidemia Mother    Hypertension Mother    Diabetes Father    Hyperlipidemia Father    Hypertension Father    Cancer Brother    Heart disease Brother    Hypertension Brother    Heart attack Brother    Colon cancer Neg Hx    Esophageal cancer Neg Hx    Rectal cancer Neg Hx    Stomach cancer Neg Hx     Past Surgical History:  Procedure Laterality Date   carbuncle     back of head   CESAREAN SECTION     COLONOSCOPY     KNEE SURGERY     quad   QUADRICEPS REPAIR     TOTAL KNEE ARTHROPLASTY Left 08/13/2014   Procedure: TOTAL KNEE ARTHROPLASTY;  [place]: Cordella Glendia Hutchinson, MD;  Location: [facility] OR;  Service: Orthopedics;  Laterality: Left;   Social History   Occupational History   Not on file  Tobacco Use   Smoking status: Former    Current packs/day: 0.00    Types: Cigarettes    Start date: 12/13/1978    Quit date: 12/13/1978    Years since quitting: 46.0    Passive exposure: Never   Smokeless tobacco: Never  Vaping Use   Vaping status: Never Used  Substance and Sexual Activity   Alcohol use: No    Alcohol/week: 0.0 standard drinks of alcohol   Drug use: No   Sexual activity: Not Currently        "

## 2025-01-31 ENCOUNTER — Encounter: Admitting: Podiatry

## 2025-02-14 ENCOUNTER — Encounter: Admitting: Podiatry

## 2025-03-06 ENCOUNTER — Ambulatory Visit: Admitting: Podiatry

## 2025-03-07 ENCOUNTER — Encounter: Admitting: Podiatry
